# Patient Record
Sex: Female | Born: 1960 | Race: White | Hispanic: No | Marital: Married | Smoking: Never smoker
Health system: Southern US, Community
[De-identification: ages and names within clinical notes are randomized; demographics above are authoritative.]

## PROBLEM LIST (undated history)

## (undated) DIAGNOSIS — J309 Allergic rhinitis, unspecified: Secondary | ICD-10-CM

## (undated) DIAGNOSIS — F419 Anxiety disorder, unspecified: Secondary | ICD-10-CM

## (undated) DIAGNOSIS — G4730 Sleep apnea, unspecified: Secondary | ICD-10-CM

## (undated) DIAGNOSIS — C50919 Malignant neoplasm of unspecified site of unspecified female breast: Secondary | ICD-10-CM

## (undated) DIAGNOSIS — M419 Scoliosis, unspecified: Secondary | ICD-10-CM

## (undated) DIAGNOSIS — F329 Major depressive disorder, single episode, unspecified: Secondary | ICD-10-CM

## (undated) DIAGNOSIS — M1990 Unspecified osteoarthritis, unspecified site: Secondary | ICD-10-CM

## (undated) DIAGNOSIS — R87619 Unspecified abnormal cytological findings in specimens from cervix uteri: Secondary | ICD-10-CM

## (undated) DIAGNOSIS — F32A Depression, unspecified: Secondary | ICD-10-CM

## (undated) DIAGNOSIS — Z923 Personal history of irradiation: Secondary | ICD-10-CM

## (undated) HISTORY — DX: Unspecified abnormal cytological findings in specimens from cervix uteri: R87.619

## (undated) HISTORY — PX: JOINT REPLACEMENT: SHX530

## (undated) HISTORY — DX: Unspecified osteoarthritis, unspecified site: M19.90

## (undated) HISTORY — DX: Allergic rhinitis, unspecified: J30.9

## (undated) HISTORY — PX: BREAST SURGERY: SHX581

## (undated) HISTORY — DX: Anxiety disorder, unspecified: F41.9

## (undated) HISTORY — PX: CERVICAL CONIZATION W/BX: SHX1330

## (undated) HISTORY — DX: Scoliosis, unspecified: M41.9

## (undated) HISTORY — PX: TUBAL LIGATION: SHX77

## (undated) HISTORY — DX: Personal history of irradiation: Z92.3

---

## 2000-06-11 ENCOUNTER — Inpatient Hospital Stay (HOSPITAL_COMMUNITY): Admission: AD | Admit: 2000-06-11 | Discharge: 2000-06-11 | Payer: Self-pay | Admitting: *Deleted

## 2000-07-23 ENCOUNTER — Ambulatory Visit (HOSPITAL_COMMUNITY): Admission: RE | Admit: 2000-07-23 | Discharge: 2000-07-23 | Payer: Self-pay | Admitting: Obstetrics and Gynecology

## 2000-07-23 ENCOUNTER — Encounter: Payer: Self-pay | Admitting: Obstetrics and Gynecology

## 2000-11-18 ENCOUNTER — Encounter (INDEPENDENT_AMBULATORY_CARE_PROVIDER_SITE_OTHER): Payer: Self-pay

## 2000-11-18 ENCOUNTER — Inpatient Hospital Stay (HOSPITAL_COMMUNITY): Admission: AD | Admit: 2000-11-18 | Discharge: 2000-11-20 | Payer: Self-pay | Admitting: Obstetrics and Gynecology

## 2001-01-14 ENCOUNTER — Other Ambulatory Visit: Admission: RE | Admit: 2001-01-14 | Discharge: 2001-01-14 | Payer: Self-pay | Admitting: Obstetrics and Gynecology

## 2003-09-26 ENCOUNTER — Other Ambulatory Visit: Admission: RE | Admit: 2003-09-26 | Discharge: 2003-09-26 | Payer: Self-pay | Admitting: Obstetrics and Gynecology

## 2004-04-05 ENCOUNTER — Ambulatory Visit (HOSPITAL_BASED_OUTPATIENT_CLINIC_OR_DEPARTMENT_OTHER): Admission: RE | Admit: 2004-04-05 | Discharge: 2004-04-05 | Payer: Self-pay | Admitting: Family Medicine

## 2005-11-10 ENCOUNTER — Other Ambulatory Visit: Admission: RE | Admit: 2005-11-10 | Discharge: 2005-11-10 | Payer: Self-pay | Admitting: Obstetrics and Gynecology

## 2006-05-15 ENCOUNTER — Encounter: Admission: RE | Admit: 2006-05-15 | Discharge: 2006-05-15 | Payer: Self-pay | Admitting: Family Medicine

## 2006-12-15 ENCOUNTER — Encounter: Admission: RE | Admit: 2006-12-15 | Discharge: 2006-12-15 | Payer: Self-pay | Admitting: Family Medicine

## 2009-02-12 ENCOUNTER — Encounter: Payer: Self-pay | Admitting: Obstetrics & Gynecology

## 2009-02-12 ENCOUNTER — Ambulatory Visit (HOSPITAL_COMMUNITY): Admission: RE | Admit: 2009-02-12 | Discharge: 2009-02-12 | Payer: Self-pay | Admitting: Obstetrics & Gynecology

## 2009-12-18 ENCOUNTER — Encounter: Admission: RE | Admit: 2009-12-18 | Discharge: 2009-12-18 | Payer: Self-pay | Admitting: Family Medicine

## 2010-08-17 ENCOUNTER — Encounter: Payer: Self-pay | Admitting: Family Medicine

## 2010-11-03 LAB — PREGNANCY, URINE: Preg Test, Ur: NEGATIVE

## 2010-11-03 LAB — CBC
HCT: 36.9 % (ref 36.0–46.0)
Hemoglobin: 13.1 g/dL (ref 12.0–15.0)
MCHC: 35.4 g/dL (ref 30.0–36.0)
MCV: 93.4 fL (ref 78.0–100.0)
Platelets: 218 10*3/uL (ref 150–400)
RBC: 3.95 MIL/uL (ref 3.87–5.11)
RDW: 12.3 % (ref 11.5–15.5)
WBC: 6.1 10*3/uL (ref 4.0–10.5)

## 2010-11-03 LAB — URINALYSIS, ROUTINE W REFLEX MICROSCOPIC
Bilirubin Urine: NEGATIVE
Glucose, UA: NEGATIVE mg/dL
Ketones, ur: NEGATIVE mg/dL
Leukocytes, UA: NEGATIVE
Nitrite: NEGATIVE
Protein, ur: NEGATIVE mg/dL
Specific Gravity, Urine: >1.03 — ABNORMAL HIGH (ref 1.005–1.030)
Urobilinogen, UA: 0.2 mg/dL (ref 0.0–1.0)
pH: 5.5 (ref 5.0–8.0)

## 2010-11-03 LAB — URINE MICROSCOPIC-ADD ON

## 2010-12-10 NOTE — Op Note (Signed)
NAME:  Kelly [name], Kelly [name]             ACCOUNT NO.:  [id number]   MEDICAL RECORD NO.:  [id number]          PATIENT TYPE:  AMB   LOCATION:  SDC                           FACILITY:  [facility]   PHYSICIAN:  M. Leda Quail, MD  DATE OF BIRTH:  August 07, 1960   DATE OF PROCEDURE:  02/12/2009  DATE OF DISCHARGE:                               OPERATIVE REPORT   PREOPERATIVE DIAGNOSES:  20. A 50 year old G6, P3 married white female with daily spotting x1      year.  2. Sonohysterogram showing endometrial polyp.  3. History of abnormal Pap in 1998 treated with conization of the      cervix.  4. History of cesarean section x3 with bilateral tubal ligation on her      third delivery.   POSTOPERATIVE DIAGNOSES:  37. A 50 year old G6, P3 married white female with daily spotting x1      year.  2. Sonohysterogram showing endometrial polyp.  3. History of abnormal Pap in 1998 treated with conization of the      cervix.  4. History of cesarean section x3 with bilateral tubal ligation on her      third delivery.   PROCEDURE:  Hysteroscopy with dilation and curettage.   SURGEON:  Lum Keas, MD   ASSISTANT:  OR staff.   ANESTHESIA:  LMA, Dr. Tacy Dura oversaw the case.   FINDINGS:  Endometrium with an fluffy tissue in the endocervical canal  as well.  No distinct polyps were noted.   SPECIMENS:  Curettings sent to Pathology.   ESTIMATED BLOOD LOSS:  Minimal.   FLUIDS:  1400 mL of LR.   URINE OUTPUT:  Minimal.   FLUID DEFICIT:  125 mL of 1.5% glycine.   COMPLICATIONS:  None.   INDICATIONS:  Kelly [name] is a 50 year old G6, P3 married white  female as a new patient to our practice.  She was seen initially by  nurse practitioner Sara Chu on June 30.  She reported irregular  bleeding over the last year, which upon further discussion was actually  daily spotting which can be dark or bright red or light pink.  Because  of this, a sonohysterogram was recommended with possible biopsy.   This  was done on July 14.  This showed an 8.9 x 4.5 x 5.5 cm uterus with an  8.7-mm endometrium.  Her ovaries appeared normal except for a 14 x 11 x  16-mm cyst with slightly irregular margins on ultrasound.  On  sonohysterogram, she had a 5.4-mm polyp that was like it was present in  the lower uterine segment as well as some irregular-appearing  endometrial tissue inside the cavity.  She has a question of adhesions  of the uterus anteriorly with some thickening on the anterior aspect of  the uterine wall.  We discussed the findings and I had recommended a  biopsy to rule out any abnormal pathology or hysteroscopy with D and C  for possible diagnosis and treatment.  Risks and benefits were explained  to the patient.  She was with her mother in the office.  She decided  that she  wanted to proceed with surgery and she presents for this today.   PROCEDURES:  The patient was taken to the operating room.  She was  placed in a supine position.  Anesthesia was administered by the  anesthesia staff without difficulty.  Legs were positioned in the low  lithotomy position in [place] stirrups.  SCDs in her lower extremities  bilaterally.  Legs were positioned to the high lithotomy position.  Abdomen, perineum and the thighs and vagina were prepped and draped in a  normal sterile fashion.  Time-out was performed.   Bivalve speculum was placed in the vagina.  The cervix was visualized.  It is much smaller than the tip of the cervix because of a conization  and there is scarring around the os.  The cervix was grasped with a  single-tooth tenaculum.  A paracervical block with 1% Xylocaine mixed  1:1 with epinephrine (1:100,000 units) was instilled in the cervix, 10  mL total was used.  Using [facility] dilators, the cervix was dilated up to a  #19.  The uterus sounded to approximately 7.5 cm.  An attempt was made  to pass the 5-mm diagnostic hysteroscope, but the cervix needed to be  dilated up to a #23.   At this point, the hysteroscope could be passed.  There was fluffy tissue of the endometrium that was noted.  The  endometrial cavity had significant fluffy tissue and blood.  The patient  just started her menstrual cycle.  The tubal ostia at this point could  not be identified, but I felt comfortable that I was in the endometrial  cavity.  The scope was removed and a #1 rough curette was used to  curette the cavity to rough gritty texture was noted in all quadrants.  The scope was replaced in the cervical canal and in the endometrial  cavity and much better visualization was present after the fluffy tissue  was removed.  The tubal ostia were noted and the cavity at this point  appeared much more normal and the tissue present was much thinner.  Withdrawing the hysteroscope through the cervical canal, the same was  true of the cervical canal as the fluffy tissue that was present was  also absent.  At this point, the procedure was ended.  Of note, the  uterus was very anteflexed and with any prepping or manipulation of the  uterus, the anterior abdominal wall down low approximately at the level  of her C-section moved.  I do feel that she has some adhesions that are  present as a result of the C-section.  Also, the patient voided right  before going to the operating room and an I and O catheterization was  performed after the patient was prepped, but very scant urine was noted.  At this point, the procedure was ended.  Betadine prep was removed from  the skin.  The procedure was ended.  All instruments were removed from  the vagina.  The tenaculum was removed from the anterior lip of the  cervix.  No bleeding was noted.  The Betadine prep was removed from the  skin.  The legs were positioned back in the supine position.  The  patient was awake from anesthesia without difficulty.   Sponge, lap, needle, and instrument counts were correct x2.  The patient  tolerated the procedure well.  She  was awaken from anesthesia and taken  to the recovery room in stable condition.      Lum Keas,  MD  Electronically Signed     MSM/MEDQ  D:  02/12/2009  T:  02/12/2009  Job:  [id number]

## 2010-12-13 NOTE — H&P (Signed)
[facility]  Patient:    Kelly [name], Kelly [name]                    MRN: [id number] Adm. Date:  [id number] Attending:  Lenoard Aden                         History and Physical  CHIEF COMPLAINT:              Spontaneous rupture of membranes at 0130.  HISTORY OF PRESENT ILLNESS:   [age]-year-old white female, G7, P1-1-4-2, EDD of Dec 08, 2000, at 37 weeks, who presents with spontaneous rupture of membranes.  ALLERGIES:                    No known drug allergies.  MEDICATIONS:                  Prozac and prenatal vitamins.  OBSTETRICAL HISTORY:          Stillborn.  Vaginal delivery 1985.  Spontaneous pregnancy loss 1988.  C-section due to placenta previa and abruption in 1989, at 32 weeks.  Missed AB without D&C and missed AB with D&C, both in the 1990s. Scheduled C-section of 8-pound 11-ounce female 1995, no complications.  Previous history of cervical conization 1998.  FAMILY HISTORY:               Heart disease, hypertension, thyroid dysfunction, non-Hodgkins lymphoma.  PAST MEDICAL HISTORY:         Depression.  PAST SURGICAL HISTORY:        As previously noted.  SOCIAL HISTORY:               Noncontributory.  PRENATAL LABORATORY DATA:     Blood type A positive.  History of chlamydia positive, repeat negative.  Rubella immune.  Hepatitis B surface antigen negative.  HIV negative.  Pregnancy course otherwise uncomplicated.  PHYSICAL EXAMINATION:  GENERAL:                      Well-developed, well-nourished white female in no apparent distress.  HEENT:                        Normal.  LUNGS:                        Clear.  HEART:                        Regular rhythm.  ABDOMEN:                      Soft, gravid, and nontender.  Estimated fetal weight 8 pounds.  PELVIC:                       Cervix is 3, 100%, vertex, and -2.  EXTREMITIES:                  No cords.  NEUROLOGIC:                   Nonfocal.  IMPRESSION:                    Term intrauterine pregnancy with previous C-section x 2, with spontaneous rupture of membranes, in active labor.  PLAN:  Proceed with elective repeat C-section.  The risks of anesthesia, infection, bleeding, injury to abdominal organs with need for repair was discussed.  Tubal ligation discussed.  Failure risk of 5-04/999 and the permanence of the procedure discussed with the patient, and she acknowledges and desires to proceed.DD:  11/18/00 TD:  11/18/00 Job: [id number]

## 2010-12-13 NOTE — Procedures (Signed)
NAME:  Kelly [name], Kelly [name]           ACCOUNT NO.:  [id number]   MEDICAL RECORD NO.:  [id number]          PATIENT TYPE:  OUT   LOCATION:  SLEEP CENTER                 FACILITY:  [facility]   PHYSICIAN:  Clinton D. Maple Hudson, M.D. DATE OF BIRTH:  18-Mar-1961   DATE OF ADMISSION:  04/05/2004  DATE OF DISCHARGE:  04/05/2004                              NOCTURNAL POLYSOMNOGRAM   REFERRING PHYSICIAN:  Dr. [name].   INDICATION FOR STUDY:  Hypersomnia with sleep apnea.  Epworth Sleepiness  Score 14/24.  Neck size 14 inches.  BMI 21.  Weight 128 pounds.  Medications:  Lexapro.   SLEEP ARCHITECTURE:  Total sleep time 252 minutes with sleep efficiency 57%.  Stage 1 was 5%, stage 2 48%, stages 3 and 4 were 13%, REM was 35% of total  sleep time.  Latency to sleep onset:  94 minutes.  Latency to REM:  110  minutes.  Awake after sleep onset:  173 minutes.  She slept only supine.  REM RDI:  27/hour.   RESPIRATORY DATA:  Mild obstructive sleep apnea, hypopnea syndrome, RDI  14/hour.  This included 32 hypopneas, 26 obstructive apneas, one central  apnea.  CPAP was titrated to 6 CWP, RDI 0 per hour, using a small  Respironics Comfort Gel mask.  The patient appeared to tolerate this well.   OXYGEN DATA:  Mild snoring with very mild oxygen desaturation to a nadir of  87%.  Mean oxygen saturation on CPAP was 95-96% on room air.   CARDIAC DATA:  Normal sinus rhythm.   MOVEMENT/PARASOMNIA:  Eighty-three limb jerks were recorded of which 14 were  associated with arousal or awakening for a periodic limb movement with  arousal index of 3.3/hour.   IMPRESSION/RECOMMENDATION:  Mild obstructive sleep apnea/hypopnea syndrome,  RDI 14/hour.  Successful CPAP titration to 6 CWP, RDI 0 per hour, using a  small Respironics Comfort Gel Mask.  Periodic limb movement with arousal,  3.3 episodes per hour.      CDY/MEDQ  D:  04/14/2004 16:51:39  T:  04/15/2004 15:57:01  Job:  [id number]

## 2010-12-13 NOTE — H&P (Signed)
[facility]  Patient:    Kelly [name], Kelly [name]                      MRN: [id number] Adm. Date:  11/24/00 Attending:  Silverio Lay, M.D.                         History and Physical  DATE OF BIRTH:                1961-01-07  REASON FOR ADMISSION:         Intrauterine pregnancy at 38 weeks status post previous cesarean section x 2 admitted for repeat low transverse cesarean section.  HISTORY OF PRESENT ILLNESS:   This is a 50 year old married white female gravida 7, para 3, aborta 3 with a due date of Dec 08, 2000 by ultrasound being admitted at 38 weeks to undergo repeat low transverse cesarean section. She was last evaluated in the office on November 16, 2000 reporting good fetal activity, denying any symptoms of pregnancy-induced hypertension, denying any bleeding or watery discharge, and reporting frequent and irregular contractions as for the last four weeks.  She also reported mucousy discharge that was increasing over the last few days.  Her vaginal exam on that visit revealed a cervix at 1 cm, 90% effaced, vertex -1.  PRENATAL COURSE:              Blood type A+, toxoplasmosis serology compatible with past immunity, RPR nonreactive, rubella immune, HBsAg negative, HIV negative, gonorrhea negative, chlamydia negative.  A 16-week AFP was within normal limits.  Amniocentesis was declined.  A 20-week ultrasound revealed a normal anatomy survey with a cervical length of 3.5 cm.  A 28-week glucose tolerance test was within normal limits.  A 35-week group B Strep was positive.  At 32 weeks for size greater than dates, the patient underwent an ultrasound with estimated fetal weight 4 pounds 11 ounces, 92nd percentile, but a head circumference at the 99% percentile.  The patient was then referred to fetal maternal medicine at [facility] to undergo evaluation for possible macrocephaly.  Everything came back negative and the intracranial anatomy was  normal.  The patient was reassured.  Because of increased contractions during her pregnancy, the patient was maintained on modified bedrest to avoid preterm cervical change.  Prenatal course was otherwise uneventful.  ALLERGIES:                    No known drug allergies.  PAST MEDICAL HISTORY:         1985 vaginal delivery of stillborn at 67 weeks. No known cause except for placental insufficiency.  1988 spontaneous miscarriage at 4-5 weeks.  No D&C.  November 1989 cesarean section at 32 weeks for placenta previa of a female infant weighing 5 pounds 11 ounces.  Two other miscarriages between 1989 and 1995, one requiring D&C.  No complications.  May 1995 scheduled planned elective cesarean section at 40 weeks of a female infant weighing 8 pounds 11 ounces.  No complications.  The patient also had conization in 1998 for dysplasia, normal Pap smears afterwards.  History of depression and the patient has been on Prozac since January 10 and has had no recurrence of symptoms.  FAMILY HISTORY:               Mother with chronic hypertension, hypothyroidism.  Mother with non-Hodgkins lymphoma.  SOCIAL HISTORY:  Married, nonsmoker, homemaker.  PHYSICAL EXAMINATION:  VITAL SIGNS:                  Current weight is 167 pounds for a total weight gain during this pregnancy of 27 pounds.  Blood pressure 102/64.  HEENT:                        Negative.  LUNGS:                        Clear.  HEART:                        Normal.  ABDOMEN:                      Gravid and nontender.  Fundal height at 40 cm. Vertex presentation.  Vaginal exam:  1 cm, 90% effaced, vertex -1.  EXTREMITIES:                  Pitting edema, 3+.  Normal reflexes.  ASSESSMENT:                   Intrauterine pregnancy at 38 weeks, status post two previous cesarean sections to undergo repeat elective cesarean section.  PLAN:                         The patient will be admitted on April 30.  She will undergo  third cesarean section.  Procedure, as well as possible complications including bleeding, infection, and injury to bowels, bladder, and ureters have been reviewed with the patient.  Bilateral tubal ligation was offered with discussion on irreversibility and failure rate of 1:250.  The patient will think and confirm at the time of surgery. DD:  11/16/00 TD:  11/16/00 Job: [id number]

## 2010-12-13 NOTE — Op Note (Signed)
[facility]  Patient:    Kelly [name], Kelly [name]                    MRN: [id number] Proc. Date: 11/18/00 Adm. Date:  [id number] Attending:  Lenoard Aden                           Operative Report  PREOPERATIVE DIAGNOSIS:       A 37 week intrauterine pregnancy, previous                               cesarean section times two, for elective repeat,                               desire for elective sterilization, spontaneous                               rupture of membranes in active labor.  POSTOPERATIVE DIAGNOSIS:      A 37-week intrauterine pregnancy, previous                               cesarean section times two, for elective repeat,                               desire for elective sterilization, spontaneous                               rupture of membranes in active labor.  OPERATION:                    Repeat low segment transverse cesarean section                               and bilateral partial salpingectomy.  SURGEON:                      Lenoard Aden, M.D.  ASSISTANT:                    Silverio Lay, M.D.  ANESTHESIA:                   Spinal by Dr. Jean Rosenthal.  ESTIMATED BLOOD LOSS:         800 cc.  COMPLICATIONS:                None.  DRAINS:                       Foley.  COUNTS:                       Correct.  SPECIMEN:                     Tubal segments x 2.  FINDINGS:                     Full-term living female, right occiput transverse position.  Apgars 8 and 9.  Pediatricians in attendance.  Placenta posteriorly, delivered manually intact.  Normal tubes and ovaries noted.  No evidence of extensive pelvic adhesive disease noted.  Adherence of the anterior bladder flap to the lower edge of the fascia is noted.  DESCRIPTION OF PROCEDURE:     After being apprised of the risks of anesthesia, infection, bleeding, injury to the abdominal organs and need for repair, and failure risk of the tubal ligation of approximately 10  per 1000, the patient was brought to the operating room where she was administered a spinal anesthetic and then placed in the dorsolithotomy position without complications.  Prepped and draped in the usual sterile fashion.  Foley catheter placed.  After achieving adequate anesthesia, dilute Marcaine solution was placed in the incision.  A Pfannenstiel skin incision was made in the area of the old incision with the scalpel, and carried down to the fascia which was nicked in the midline and opened transversely with Mayo scissors. Posterior rectus aponeurosis to the fascia is poorly defined and adherent to the bladder flap.  Sharp dissection noted.  The bladder is without evidence of injury and easily dissected off the anterior wall or the posterior wall of the rectus fascia.  At this time, the visceroperitoneum was scored in a smile-like fashion.  The uterus was scored in a smile-like fashion, and curved hysterotomy incision is developed for atraumatic delivery of a full-term living female fetus, delivered from right occiput transverse position, and the pediatricians are in attendance.  Apgars 8 and 9.  Cord blood collected and the placenta delivered manually intact.  Three vessel cord noted.  The uterus exteriorized and curetted using a dry lap pad, inspected, and finding two normal tubes and ovaries, and no extensions of the curved hysterotomy incision.  At this time, the incision closed using a 0 Monocryl in a continuous running fashion.  Good hemostasis is achieved and pressure is applied.  At this time, the left tube was traced out to its fimbriated end, ampulla-isthmic section of the tube is isolated.  An avascular portion of the mesosalpinx was cauterized.  Proximal and distal 0 plain ties were placed and the tubal segment is excised.  Tubal lumens are visualized and cauterized. Good hemostasis is noted.  The exact same procedure that is performed on the left tube is then performed  on the right tube, and tubal segment is excised as well.  Reinspection of the incision finds good hemostasis.  The pericolic gutters are irrigated.  All blood clot subsequently removed and the bladder flap inspected and found to be hemostatic.  At this time, the uterus was placed in the abdominal cavity.  The incision reinspected.  The fascia then closed using a 0 Vicryl in a continuous running fashion.  The skin closed using staples.  Dilute Marcaine solution was replaced into the incision.  The patient tolerated the procedure well and was transferred to the recovery room in good condition. DD:  11/18/00 TD:  11/18/00 Job: [id number]

## 2010-12-13 NOTE — Consult Note (Signed)
[facility]  Patient:    Kelly [name], Kelly [name]                      MRN: [id number] Adm. Date:  [id number] Attending:  Ermalene Searing Dictator:   Marina Gravel, M.D.                          Consultation Report  CHIEF COMPLAINT:              Lower abdominal pain.  HISTORY OF PRESENT ILLNESS:   The patient is a 50 year old white female gravida 6, para 1-2-2-2, who presents at 15 weeks with complaints of lower abdominal pain.  She stated the pain was aggravated after bending over multiple times while involved with child care today.  No contractions, leakage of fluid or vaginal bleeding.  She describes the pain as sharp in bilateral lower quadrants and was not relieved with rest this evening.  She did notice that it was worse with positional changes and walking.  PAST OBSTETRICS HISTORY:      Complicated by preterm birth x 2 and two miscarriages.  One of the preterm deliveries was also complicated by placenta previa.  She is otherwise healthy.  REVIEW OF SYSTEMS:            Otherwise negative.  PHYSICAL EXAMINATION:  VITAL SIGNS:                  Temperature 98.6, blood pressure 104/61, fetal heart rate 160.  GENERAL:                      The patient is alert and oriented and in no acute distress.  At time of my exam the patient states she is feeling much better, was smiling appropriately and appeared to be in no pain whatsoever.  ABDOMEN:                      Showed active bowel sounds, liver and spleen normal, no hernia, not tenderness, no acute signs.  The patient did have mild bilateral lower quadrant tenderness to palpation.  PELVIC EXAM:                  Normal external female genitalia, vagina and cervix normal.  The cervix was closed.  No blood in the vagina.  LABORATORIES:                 A clean-catch urinalysis within normal limits. CBC showed a white count 11.1, hemoglobin 12.8 and platelets 213,000.  ASSESSMENT:                    Probable round ligament pain.  Preterm labor precautions and bleeding precautions given.  The patient has an appointment in the office tomorrow and will follow up at that time. DD:  06/12/00 TD:  06/12/00 Job: [id number]

## 2010-12-17 ENCOUNTER — Other Ambulatory Visit: Payer: Self-pay | Admitting: Family Medicine

## 2011-02-19 ENCOUNTER — Other Ambulatory Visit: Payer: Self-pay | Admitting: Obstetrics & Gynecology

## 2011-02-19 DIAGNOSIS — R102 Pelvic and perineal pain: Secondary | ICD-10-CM

## 2011-02-21 ENCOUNTER — Ambulatory Visit (HOSPITAL_COMMUNITY)
Admission: RE | Admit: 2011-02-21 | Discharge: 2011-02-21 | Disposition: A | Discharge status: Home / Self Care | Payer: Managed Care, Other (non HMO) | Source: Ambulatory Visit | Attending: Obstetrics & Gynecology | Admitting: Obstetrics & Gynecology

## 2011-02-21 DIAGNOSIS — N83209 Unspecified ovarian cyst, unspecified side: Secondary | ICD-10-CM | POA: Insufficient documentation

## 2011-02-21 DIAGNOSIS — R102 Pelvic and perineal pain: Secondary | ICD-10-CM

## 2011-02-21 DIAGNOSIS — N949 Unspecified condition associated with female genital organs and menstrual cycle: Secondary | ICD-10-CM | POA: Insufficient documentation

## 2011-02-27 ENCOUNTER — Emergency Department (HOSPITAL_COMMUNITY): Payer: Managed Care, Other (non HMO)

## 2011-02-27 ENCOUNTER — Inpatient Hospital Stay (HOSPITAL_COMMUNITY)
Admission: EM | Admit: 2011-02-27 | Discharge: 2011-03-01 | DRG: 917 | Disposition: A | Discharge status: Other Acute Inpatient Hospital | Payer: Managed Care, Other (non HMO) | Attending: Internal Medicine | Admitting: Internal Medicine

## 2011-02-27 DIAGNOSIS — G929 Unspecified toxic encephalopathy: Secondary | ICD-10-CM | POA: Diagnosis present

## 2011-02-27 DIAGNOSIS — M1990 Unspecified osteoarthritis, unspecified site: Secondary | ICD-10-CM | POA: Diagnosis present

## 2011-02-27 DIAGNOSIS — F341 Dysthymic disorder: Secondary | ICD-10-CM | POA: Diagnosis present

## 2011-02-27 DIAGNOSIS — G8929 Other chronic pain: Secondary | ICD-10-CM | POA: Diagnosis present

## 2011-02-27 DIAGNOSIS — G92 Toxic encephalopathy: Secondary | ICD-10-CM | POA: Diagnosis present

## 2011-02-27 DIAGNOSIS — T43502A Poisoning by unspecified antipsychotics and neuroleptics, intentional self-harm, initial encounter: Secondary | ICD-10-CM | POA: Diagnosis present

## 2011-02-27 DIAGNOSIS — Z79899 Other long term (current) drug therapy: Secondary | ICD-10-CM

## 2011-02-27 DIAGNOSIS — N83209 Unspecified ovarian cyst, unspecified side: Secondary | ICD-10-CM | POA: Diagnosis present

## 2011-02-27 DIAGNOSIS — Y9239 Other specified sports and athletic area as the place of occurrence of the external cause: Secondary | ICD-10-CM

## 2011-02-27 DIAGNOSIS — T43294A Poisoning by other antidepressants, undetermined, initial encounter: Secondary | ICD-10-CM | POA: Diagnosis present

## 2011-02-27 DIAGNOSIS — G4733 Obstructive sleep apnea (adult) (pediatric): Secondary | ICD-10-CM | POA: Diagnosis present

## 2011-02-27 DIAGNOSIS — T424X4A Poisoning by benzodiazepines, undetermined, initial encounter: Principal | ICD-10-CM | POA: Diagnosis present

## 2011-02-27 DIAGNOSIS — I959 Hypotension, unspecified: Secondary | ICD-10-CM | POA: Diagnosis present

## 2011-02-27 DIAGNOSIS — Y92838 Other recreation area as the place of occurrence of the external cause: Secondary | ICD-10-CM

## 2011-02-27 LAB — COMPREHENSIVE METABOLIC PANEL
ALT: 23 U/L (ref 0–35)
AST: 25 U/L (ref 0–37)
Albumin: 3.9 g/dL (ref 3.5–5.2)
Alkaline Phosphatase: 72 U/L (ref 39–117)
BUN: 12 mg/dL (ref 6–23)
CO2: 25 mEq/L (ref 19–32)
Calcium: 9.4 mg/dL (ref 8.4–10.5)
Chloride: 104 mEq/L (ref 96–112)
Creatinine, Ser: 0.71 mg/dL (ref 0.50–1.10)
GFR calc Af Amer: >60 mL/min (ref >60)
GFR calc non Af Amer: >60 mL/min (ref >60)
Glucose, Bld: 85 mg/dL (ref 70–99)
Potassium: 3.2 mEq/L — ABNORMAL LOW (ref 3.5–5.1)
Sodium: 138 mEq/L (ref 135–145)
Total Bilirubin: 1.2 mg/dL (ref 0.3–1.2)
Total Protein: 7.2 g/dL (ref 6.0–8.3)

## 2011-02-27 LAB — URINALYSIS, ROUTINE W REFLEX MICROSCOPIC
Bilirubin Urine: NEGATIVE
Glucose, UA: NEGATIVE mg/dL
Hgb urine dipstick: NEGATIVE
Ketones, ur: NEGATIVE mg/dL
Leukocytes, UA: NEGATIVE
Nitrite: NEGATIVE
Protein, ur: NEGATIVE mg/dL
Specific Gravity, Urine: 1.03 (ref 1.005–1.030)
Urobilinogen, UA: 0.2 mg/dL (ref 0.0–1.0)
pH: 5.5 (ref 5.0–8.0)

## 2011-02-27 LAB — CBC
HCT: 39 % (ref 36.0–46.0)
Hemoglobin: 13.9 g/dL (ref 12.0–15.0)
MCH: 31 pg (ref 26.0–34.0)
MCHC: 35.6 g/dL (ref 30.0–36.0)
MCV: 87.1 fL (ref 78.0–100.0)
Platelets: 246 10*3/uL (ref 150–400)
RBC: 4.48 MIL/uL (ref 3.87–5.11)
RDW: 12.8 % (ref 11.5–15.5)
WBC: 10.1 10*3/uL (ref 4.0–10.5)

## 2011-02-27 LAB — RAPID URINE DRUG SCREEN, HOSP PERFORMED
Amphetamines: NOT DETECTED
Barbiturates: NOT DETECTED
Benzodiazepines: POSITIVE — AB
Cocaine: NOT DETECTED
Opiates: NOT DETECTED
Tetrahydrocannabinol: NOT DETECTED

## 2011-02-27 LAB — APTT: aPTT: 34 seconds (ref 24–37)

## 2011-02-27 LAB — SALICYLATE LEVEL: Salicylate Lvl: <2 mg/dL — ABNORMAL LOW (ref 2.8–20.0)

## 2011-02-27 LAB — DIFFERENTIAL
Basophils Absolute: 0 10*3/uL (ref 0.0–0.1)
Basophils Relative: 0 % (ref 0–1)
Eosinophils Absolute: 0.1 10*3/uL (ref 0.0–0.7)
Eosinophils Relative: 1 % (ref 0–5)
Lymphocytes Relative: 14 % (ref 12–46)
Lymphs Abs: 1.4 10*3/uL (ref 0.7–4.0)
Monocytes Absolute: 0.7 10*3/uL (ref 0.1–1.0)
Monocytes Relative: 6 % (ref 3–12)
Neutro Abs: 8 10*3/uL — ABNORMAL HIGH (ref 1.7–7.7)
Neutrophils Relative %: 79 % — ABNORMAL HIGH (ref 43–77)

## 2011-02-27 LAB — URINE MICROSCOPIC-ADD ON

## 2011-02-27 LAB — ETHANOL: Alcohol, Ethyl (B): <11 mg/dL (ref 0–11)

## 2011-02-27 LAB — POCT PREGNANCY, URINE: Preg Test, Ur: NEGATIVE

## 2011-02-27 LAB — PROTIME-INR
INR: 0.98 (ref 0.00–1.49)
Prothrombin Time: 13.2 seconds (ref 11.6–15.2)

## 2011-02-27 LAB — ACETAMINOPHEN LEVEL: Acetaminophen (Tylenol), Serum: <15 ug/mL (ref 10–30)

## 2011-02-28 DIAGNOSIS — F39 Unspecified mood [affective] disorder: Secondary | ICD-10-CM

## 2011-02-28 LAB — CBC
HCT: 36.6 % (ref 36.0–46.0)
Hemoglobin: 12.1 g/dL (ref 12.0–15.0)
MCH: 29.7 pg (ref 26.0–34.0)
MCHC: 33.1 g/dL (ref 30.0–36.0)
MCV: 89.9 fL (ref 78.0–100.0)
Platelets: 227 10*3/uL (ref 150–400)
RBC: 4.07 MIL/uL (ref 3.87–5.11)
RDW: 13 % (ref 11.5–15.5)
WBC: 9.7 10*3/uL (ref 4.0–10.5)

## 2011-02-28 LAB — CARDIAC PANEL(CRET KIN+CKTOT+MB+TROPI)
CK, MB: 3.4 ng/mL (ref 0.3–4.0)
CK, MB: 3.9 ng/mL (ref 0.3–4.0)
CK, MB: 5.9 ng/mL — ABNORMAL HIGH (ref 0.3–4.0)
Relative Index: 1.3 (ref 0.0–2.5)
Relative Index: 1.3 (ref 0.0–2.5)
Relative Index: 1.1 (ref 0.0–2.5)
Total CK: 272 U/L — ABNORMAL HIGH (ref 7–177)
Total CK: 302 U/L — ABNORMAL HIGH (ref 7–177)
Total CK: 536 U/L — ABNORMAL HIGH (ref 7–177)
Troponin I: <0.3 ng/mL (ref <0.30)
Troponin I: <0.3 ng/mL (ref <0.30)
Troponin I: <0.3 ng/mL (ref <0.30)

## 2011-02-28 LAB — COMPREHENSIVE METABOLIC PANEL
ALT: 19 U/L (ref 0–35)
AST: 28 U/L (ref 0–37)
Albumin: 3 g/dL — ABNORMAL LOW (ref 3.5–5.2)
Alkaline Phosphatase: 63 U/L (ref 39–117)
BUN: 10 mg/dL (ref 6–23)
CO2: 22 mEq/L (ref 19–32)
Calcium: 8.1 mg/dL — ABNORMAL LOW (ref 8.4–10.5)
Chloride: 113 mEq/L — ABNORMAL HIGH (ref 96–112)
Creatinine, Ser: 0.61 mg/dL (ref 0.50–1.10)
GFR calc Af Amer: >60 mL/min (ref >60)
GFR calc non Af Amer: >60 mL/min (ref >60)
Glucose, Bld: 81 mg/dL (ref 70–99)
Potassium: 3.9 mEq/L (ref 3.5–5.1)
Sodium: 143 mEq/L (ref 135–145)
Total Bilirubin: 1.3 mg/dL — ABNORMAL HIGH (ref 0.3–1.2)
Total Protein: 5.9 g/dL — ABNORMAL LOW (ref 6.0–8.3)

## 2011-02-28 LAB — LIPID PANEL
Cholesterol: 168 mg/dL (ref 0–200)
HDL: 45 mg/dL (ref >39)
LDL Cholesterol: 108 mg/dL — ABNORMAL HIGH (ref 0–99)
Total CHOL/HDL Ratio: 3.7 RATIO
Triglycerides: 75 mg/dL (ref <150)
VLDL: 15 mg/dL (ref 0–40)

## 2011-02-28 LAB — URINE CULTURE
Colony Count: NO GROWTH
Culture  Setup Time: 201208030124
Culture: NO GROWTH

## 2011-02-28 LAB — MAGNESIUM: Magnesium: 1.9 mg/dL (ref 1.5–2.5)

## 2011-02-28 LAB — TSH: TSH: 0.804 u[IU]/mL (ref 0.350–4.500)

## 2011-02-28 LAB — PRO B NATRIURETIC PEPTIDE: Pro B Natriuretic peptide (BNP): 61.3 pg/mL (ref 0–125)

## 2011-02-28 LAB — MRSA PCR SCREENING: MRSA by PCR: NEGATIVE

## 2011-02-28 LAB — PHOSPHORUS: Phosphorus: 3.3 mg/dL (ref 2.3–4.6)

## 2011-03-01 ENCOUNTER — Inpatient Hospital Stay (HOSPITAL_COMMUNITY)
Admission: RE | Admit: 2011-03-01 | Discharge: 2011-03-03 | DRG: 885 | Disposition: A | Discharge status: Home / Self Care | Payer: Managed Care, Other (non HMO) | Source: Ambulatory Visit | Attending: Psychiatry | Admitting: Psychiatry

## 2011-03-01 DIAGNOSIS — T43294A Poisoning by other antidepressants, undetermined, initial encounter: Secondary | ICD-10-CM

## 2011-03-01 DIAGNOSIS — G4730 Sleep apnea, unspecified: Secondary | ICD-10-CM

## 2011-03-01 DIAGNOSIS — N83209 Unspecified ovarian cyst, unspecified side: Secondary | ICD-10-CM

## 2011-03-01 DIAGNOSIS — F339 Major depressive disorder, recurrent, unspecified: Principal | ICD-10-CM

## 2011-03-01 DIAGNOSIS — F411 Generalized anxiety disorder: Secondary | ICD-10-CM

## 2011-03-01 DIAGNOSIS — T424X4A Poisoning by benzodiazepines, undetermined, initial encounter: Secondary | ICD-10-CM

## 2011-03-01 DIAGNOSIS — T43502A Poisoning by unspecified antipsychotics and neuroleptics, intentional self-harm, initial encounter: Secondary | ICD-10-CM

## 2011-03-01 DIAGNOSIS — Z6379 Other stressful life events affecting family and household: Secondary | ICD-10-CM

## 2011-03-01 DIAGNOSIS — Z79899 Other long term (current) drug therapy: Secondary | ICD-10-CM

## 2011-03-01 DIAGNOSIS — Z818 Family history of other mental and behavioral disorders: Secondary | ICD-10-CM

## 2011-03-01 DIAGNOSIS — M1990 Unspecified osteoarthritis, unspecified site: Secondary | ICD-10-CM

## 2011-03-01 DIAGNOSIS — T438X2A Poisoning by other psychotropic drugs, intentional self-harm, initial encounter: Secondary | ICD-10-CM

## 2011-03-01 LAB — CBC
HCT: 33.1 % — ABNORMAL LOW (ref 36.0–46.0)
Hemoglobin: 11.4 g/dL — ABNORMAL LOW (ref 12.0–15.0)
MCH: 30.8 pg (ref 26.0–34.0)
MCHC: 34.4 g/dL (ref 30.0–36.0)
MCV: 89.5 fL (ref 78.0–100.0)
Platelets: 218 10*3/uL (ref 150–400)
RBC: 3.7 MIL/uL — ABNORMAL LOW (ref 3.87–5.11)
RDW: 12.9 % (ref 11.5–15.5)
WBC: 8.2 10*3/uL (ref 4.0–10.5)

## 2011-03-01 LAB — BASIC METABOLIC PANEL
BUN: 8 mg/dL (ref 6–23)
CO2: 26 mEq/L (ref 19–32)
Calcium: 8.5 mg/dL (ref 8.4–10.5)
Chloride: 106 mEq/L (ref 96–112)
Creatinine, Ser: 0.6 mg/dL (ref 0.50–1.10)
GFR calc Af Amer: >60 mL/min (ref >60)
GFR calc non Af Amer: >60 mL/min (ref >60)
Glucose, Bld: 86 mg/dL (ref 70–99)
Potassium: 4.1 mEq/L (ref 3.5–5.1)
Sodium: 137 mEq/L (ref 135–145)

## 2011-03-06 NOTE — Discharge Summary (Signed)
  NAME:  Kelly [name], SPIEWAK NO.:  [id number]  MEDICAL RECORD NO.:  [id number]  LOCATION:  0500                          FACILITY:  [facility]  PHYSICIAN:  Franchot Gallo, MD     DATE OF BIRTH:  12-06-1960  DATE OF ADMISSION:  03/01/2011 DATE OF DISCHARGE:  03/03/2011                              DISCHARGE SUMMARY   REASON FOR ADMISSION:  This is a 51 year old who was admitted after a drug overdose.  She is a transfer from the medical unit after being stabilized.  She had overdosed on Xanax and Lexapro.  FINAL IMPRESSION:  Axis I:  Major depressive disorder, recurrent resolved.  Generalized anxiety disorder, mild. Axis II:  Deferred. Axis III:  History of osteoarthritis, ovarian cyst, multiple orthopedic operations. Axis IV:  Some marital discord. Axis V:  70.  SIGNIFICANT FINDINGS:  The patient was admitted to the adult milieu for safety and stabilization.  She was active in attending groups.  She was feeling very calm with appropriate mood and affect and felt that she was doing very well.  On day of discharge the patient was having trouble initiating sleep as she has a history of sleep apnea.  Her appetite was good.  Depression had resolved, rating it a zero.  She had only denied any suicidal or homicidal thoughts or auditory or visualizations, having mild anxiety rating a two-to-three and having no medication side effects.  The patient was seen in the interdisciplinary team, was very bright and with good insight.  DISCHARGE MEDICATIONS: 1. Cymbalta 30 mg taking two in the morning and one at 2 p.m. 2. Flexeril 5 mg one every 8 hours as needed. 3. Ibuprofen 200 mg taking four every 8 hours as needed. 4. Multivitamin. 5. Natrol 5 HTP 1 tablet daily.  Her follow-up appointment with [facility] with Greig Right, phone number [phone] and Dr. Lafayette Dragon at [phone] on April 02, 2011 at 12 noon.     Landry Corporal,  N.P.   ______________________________ Franchot Gallo, MD   JO/MEDQ  D:  03/05/2011  T:  03/06/2011  Job:  [id number]  Electronically Signed by Limmie PatriciaP. on 03/06/2011 02:57:54 PM Electronically Signed by Franchot Gallo MD on 03/06/2011 03:29:17 PM

## 2011-03-06 NOTE — Consult Note (Signed)
  NAMEMarland [name]  CASIDEE, JANN NO.:  [id number]  MEDICAL RECORD NO.:  [id number]  LOCATION:  1225                         FACILITY:  [facility]  PHYSICIAN:  Eulogio Ditch, MD DATE OF BIRTH:  09/30/1960  DATE OF CONSULTATION:  02/28/2011 DATE OF DISCHARGE:                                CONSULTATION   REASON FOR CONSULTATION:  Drug overdose.  HISTORY OF PRESENT ILLNESS:  50 year old female who reported ongoing conflict with husband.  The patient is married for 28 years, they got counseling in the outpatient setting and as per her, it worked, but her husband went to [place] in the month of April for 7 days and when he came back, he was a changed person because of the stress and other factors. Also, recently when he went to [place] for 4 days, when he came back as per her, his behavior changed.  The patient actually planned for this attempt.  She went to [facility] with all her medications overdosed there at [place] rental area and then collapsed on the floor.  The patient wrote a note to the family that she is going to kill herself.  The patient and the husband is getting counseling with Lina Sar in the outpatient setting.  The patient takes Xanax and Lexapro for her mood.  PAST MEDICAL HISTORY: 1. History for osteoarthritis. 2. Ovarian cyst. 3. Multiple orthopedic surgeries on the left arm by Dr. Kevan Ny.  MENTAL STATUS EXAMINATION:  The patient is calm, cooperative during the interview.  Mood:  Depressed.  Affect:  Mood congruent.  Speech slow, soft.  Thought process:  Logical and goal directed.  Not delusional, not hallucinating.  The patient overdosed and was admitted on the medical floor because of suicide attempt.  The patient is alert, awake, oriented x3.  Memory:  Immediate, recent, remote fair.  Attention and concentration fair.  Abstraction ability fair.  Insight and judgment poor.  DIAGNOSES:  Axis I:  Mood disorder not otherwise specified. Axis II:   Rule out cluster B personality traits. Axis III:  See medical notes. Axis IV:  Ongoing conflict with husband. Axis V:  30.  RECOMMENDATIONS:  Once the patient is medically cleared, the patient can be admitted to [facility] for further stabilization.  The patient agreed with this treatment plan.     Eulogio Ditch, MD     SA/MEDQ  D:  02/28/2011  T:  02/28/2011  Job:  [id number]  Electronically Signed by Eulogio Ditch  on 03/06/2011 08:54:50 AM

## 2011-03-09 NOTE — Assessment & Plan Note (Signed)
NAME:  Kelly [name], Kelly [name] NO.:  [id number]  MEDICAL RECORD NO.:  [id number]  LOCATION:  0500                          FACILITY:  [facility]  PHYSICIAN:  Franchot Gallo, MD     DATE OF BIRTH:  [birth year]  DATE OF ADMISSION:  03/01/2011 DATE OF DISCHARGE:                      PSYCHIATRIC ADMISSION ASSESSMENT   This is a voluntary admission to the services of Dr. Harvie Heck Reading. This is a 50 year old married white female.  She apparently intentionally overdosed back on 02/27/2011.  She was found at [facility] by people who just came upon her.  Apparently she took her husband's Lexapro, her own Xanax.  She was found to be lethargic but cooperative.  She was admitted to the main hospital where she was stabilized.  She was seen in consultation by Dr. Rogers Blocker and she apparently had written a note stating that she was going to kill herself.  She and her husband are in marital counseling with [place] Scrape and she was very stressed because her mother apparently was exposed to the contaminated waters at [facility].  She herself has recently had a health scare with her ovary, it turned out to just be an ovarian cyst. She has recently had tendon surgery in her left thumb to address the arthritis that she has.  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  PAST PSYCHIATRIC HISTORY:  She has taken antidepressants for 17 years. She has never been under psychiatric care.  It has always been with her primary care.  SOCIAL HISTORY:  She has 1-1/2 years of college.  She has been married once for 28 years.  She has 3 sons, 22, 17, and 10.  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  FAMILY HISTORY:  Her father is bipolar.  One of her brothers is bipolar. He also abused substances and alcohol.  He is currently in a group home. Her mother apparently has had non-Hodgkin's lymphoma, breast cancer, esophageal cancer, and was exposed to the waters at [facility].  ALCOHOL AND DRUG HISTORY:  She denies  any substance abuse.  PRIMARY CARE PROVIDER:  Dr. [name].  ORTHOPEDIC:  The hand surgeon, Dr. Amanda Pea.  THERAPIST:  Dayton Scrape.  MEDICAL PROBLEMS:  As already stated, she has osteoarthritis.  She was recently found to have a left ovarian cyst and sleep apnea.  This is due to a congenital esophageal narrowing.  CURRENTLY PRESCRIBED MEDICATIONS:  At the time she was transferred over from the main hospital, she was on: 1. Flaxseed oil 1 capsule p.o. q. day. 2. Flexeril 5 mg p.o. q.8 h. 3. Multivitamins 1 tab p.o. q. day. 4. Black cohosh over the counter 1 tab p.o. q. day. 5. Superfruit multivitamin 1 gummy p.o. q. day. 6. Magnesium 25 mg 1 tablet p.o. q. day. 7. Biotin 1000 mcg 1 tablet p.o. q. day. 8. Saline nasal spray 2 sprays nasally at h.s. 9. 5HT 100 mg 1 tablet p.o. q. day. 10.Ibuprofen 200 mg take 4 tablets p.o. q.8 h p.r.n. 11.She has been on Cymbalta 60 mg p.o. q. day. 12.Xanax, dosage is unclear.  DRUG ALLERGIES:  Codeine.  POSITIVE PHYSICAL FINDINGS:  Well-developed, well-nourished white female who appears younger than her stated age of  50.  She is in very good physical shape despite her arthritis complaints.  She states that she practices weight lifting to deal with her arthritis and to keep in shape and to prevent bone loss.  Her physical exam is well-documented and on the chart.  MENTAL STATUS EXAM:  Today she is alert and oriented.  She is appropriately groomed, dressed, and nourished complete with full eye makeup.  Her speech is soft.  She does tend to have somewhat tangential conversation and lengthy conversation.  Although pleasant, she does dominate the conversation and does not answer questions.  Her mood was calm.  Her affect had a somewhat depressed range.  Thought processes are not clear, rational, or goal oriented.  Judgment and insight are poor. Concentration and memory are intact.  Intelligence is at least average.  DIAGNOSIS:  AXIS I: 1.  Probably mood disorder, not otherwise specified.  She actually may     be a bipolar II. 2. 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 AXIS II:  Cluster B. AXIS III: 1. Osteoarthritis. 2. Ovarian cyst. 3. Arthritis, both thumbs, with recent surgery on the left. 4. Sleep apnea. 5. She is in menopause. AXIS IV:  Conflict with husband. AXIS V:  30.  PLAN:  Admit for safety and stabilization.  Her meds will be adjusted. She is seeing Dr. Dan Humphreys.  ESTIMATED LENGTH OF STAY:  Three to 5 days.     Mickie Leonarda Salon, P.A.-C.   ______________________________ Franchot Gallo, MD   MD/MEDQ  D:  03/02/2011  T:  03/02/2011  Job:  [id number]  Electronically Signed by Jaci Lazier ADAMS P.A.-C. on 03/08/2011 11:39:08 AM Electronically Signed by Franchot Gallo MD on 03/09/2011 09:50:29 PM

## 2011-03-11 NOTE — Discharge Summary (Signed)
NAMEMarland Kitchen  TRACE, CEDERBERG NO.:  [id number]  MEDICAL RECORD NO.:  [id number]  LOCATION:  1225                         FACILITY:  [facility]  PHYSICIAN:  Erick Blinks, MD     DATE OF BIRTH:  [birth year]  DATE OF ADMISSION:  02/27/2011 DATE OF DISCHARGE:                        DISCHARGE SUMMARY - REFERRING   DISCHARGE DIAGNOSES: 1. Intentional overdose with Xanax and Lexapro. 2. Suicide attempt. 3. Toxic encephalopathy, resolved. 4. History of depression. 5. Osteoarthritis. 6. Ovarian cyst. 7. Obstructive sleep apnea, on CPAP. 8. Physiologic low blood pressure.  The patient's blood pressure     ranges in the 90s.  She is asymptomatic.  DISCHARGE MEDICATIONS: 1. Flaxseed oil 1 capsule p.o. daily. 2. Flexeril 5 mg p.o. q.8 h. p.r.n. 3. Multivitamins 1 tablet p.o. daily. 4. Black Cohosh over-the-counter 1 tablet p.o. daily. 5. Superfruit  multivitamin 1 gummy p.o. daily. 6. Magnesium 25 mg 1 tablet p.o. daily. 7. Biotin 1000 mcg 1 tablet p.o. daily. 8. Saline nasal spray 2 sprays nasally q.h.s. 9. 5-hydroxytryptophan 100 mg 1 tablet p.o. daily. 10.Ibuprofen 200 mg 4 tablets p.o. q.8 h p.r.n.  Medication stopped in the hospital, 1. Cymbalta. 2. Xanax.  ADMISSION HISTORY:  This is a 50 year old female who was found to be lethargic.  [place] namely X[place]ax and Lexapro.  She apparently had taken a total of 30 pills of Xanax and Lexapro combined.  She was found to be somewhat obtunded, but was maintaining her airway.  On arrival to the emergency room, she was subsequently admitted to the step-down unit for close observation.  For details please refer the history and physical per Dr. Susie Cassette on August 2nd.  HOSPITAL COURSE: 1. Overdose.  The patient was monitored closely in the step-down unit.     Her mental status did improve.  She is currently back to her     baseline.  She was seen in consultation  by the Psychiatry Service     and felt that she would need inpatient transfer to [facility].  For further stabilization medically, she is cleared     for transfer at this time and will arrange for that later today.     We have obviously held her Xanax at this time.  We will also     stopped her Cymbalta and these can be readdressed by the inpatient     psychiatry service. 2. Low blood pressure.  The patient's blood pressure runs in the low     90s and she reports that this is normal for her.  She is making     acute amount of urine and does not appear to be volume depleted.     She is asymptomatic and not orthostatic, now this is likely her     normal. 3. Obstructive sleep apnea.  The patient was continued on CPAP here in     the hospital.  CONSULTATIONS: 1. Psychiatry, Dr. Eulogio Ditch, M.D.  DIAGNOSTIC IMAGING:  Chest x-ray on admission shows no active disease.  DISCHARGE INSTRUCTIONS:  The patient can continue on a regular diet, conduct  her activities tolerated.  She will see her primary care physician once she is discharged from [facility], otherwise medically she is cleared for transfer to [facility] once a bed is available.     Erick Blinks, MD     JM/MEDQ  D:  03/01/2011  T:  03/01/2011  Job:  [id number]  Electronically Signed by Durward Mallard MEMON  on 03/11/2011 04:48:57 PM

## 2011-03-26 NOTE — H&P (Signed)
  NAMEMarland [name]  MAIRANY, BRUNO NO.:  [id number]  MEDICAL RECORD NO.:  [id number]  LOCATION:  1225                         FACILITY:  [facility]  PHYSICIAN:  Richarda Overlie, MD       DATE OF BIRTH:  [birth year]  DATE OF ADMISSION:  02/27/2011 DATE OF DISCHARGE:                             HISTORY & PHYSICAL   PRIMARY CARE PHYSICIAN:  Unassigned.  CHIEF COMPLAINT:  Right-sided numbness and tingling.  SUBJECTIVE:  This is a 50 year old Caucasian female with a history of hypertension who presents with right-sided numbness and tingling that started around 2 to 2:30 p.m. this afternoon.  The patient has not felt well since then.  She has complained of a headache.  She does not have any prior history of TIA or stroke.  She does have history of dyslipidemia, gout, and chronic kidney stones.  She denies any symptoms of chest pain, palpitation, shortness of breath.  She denies any  DICTATION ENDED AT THIS POINT     Richarda Overlie, MD     NA/MEDQ  D:  02/27/2011  T:  03/01/2011  Job:  [id number]  Electronically Signed by Richarda Overlie MD on 03/26/2011 07:04:30 PM

## 2011-03-26 NOTE — H&P (Signed)
NAME:  Kelly [name], Kelly [name] NO.:  [id number]  MEDICAL RECORD NO.:  [id number]  LOCATION:  WLED                         FACILITY:  [facility]  PHYSICIAN:  Richarda Overlie, MD       DATE OF BIRTH:  1960/10/24  DATE OF ADMISSION:  02/27/2011 DATE OF DISCHARGE:                             HISTORY & PHYSICAL   PRIMARY CARE PHYSICIAN:  Unassigned.  CHIEF COMPLAINT:  Drug overdose.  SUBJECTIVE:  This is a 50 year old female who presents to the ER with a chief complaint of drug overdose.  The patient was found at [facility] with a bottle of pills namely, Xanax and Lexapro.  The patient apparently took a total of 30 pills of Xanax and Lexapro combined.  She was found to be lethargic, but cooperative with the EMS.  Then, she was found somewhat obtunded, but she had maintained her airway with a GCS of 15 and was brought to the ER for further evaluation of the patient.  The patient has been somewhat depressed and suicidal because of multiple orthopedic surgeries and chronic pain issues.  She has also been concerned about an ovarian cyst.  PAST MEDICAL HISTORY: 1. History of depression. 2. Osteoarthritis. 3. Ovarian cyst.  PAST SURGICAL HISTORY:  History of multiple orthopedic surgeries on the left thumb by Dr. Kevan Ny.  SOCIAL HISTORY:  The patient is married, lives with her husband and 3 children.  She is a Financial trader.  FAMILY HISTORY:  Mother has non-Hodgkin lymphoma, breast cancer, esophageal cancer.  Father has a history of bipolar disorder.  PHYSICAL EXAMINATION:  VITAL SIGNS:  Blood pressure 104/62, pulse of 80, respirations 18. GENERAL:  Comfortable, in no acute cardiopulmonary distress. HEENT:  Pupils equal and reactive.  Extraocular movements intact. NECK:  Supple.  No JVD. LUNGS:  Clear to auscultation bilaterally.  No wheezes or crackles or rhonchi. CARDIOVASCULAR:  Regular rate and rhythm.  No murmurs, rubs, or gallops. ABDOMEN:  Soft, nontender,  nondistended. EXTREMITIES:  Without cyanosis, clubbing, or edema. NEUROLOGIC:  Cranial nerves II through XII grossly intact. PSYCHIATRIC:  Appropriate mood and affect. LYMPHATIC:  No axillary, inguinal, or cervical lymphadenopathy.  LABORATORY DATA:  Urinalysis negative.  Urine drug screen positive for benzos.  INR 0.98.  Coags were less than 2. Alcohol less than 11.  CMP, sodium 138, potassium 3.2, bicarbonate 25, glucose 85, BUN 12, creatinine 0.71.  Total bilirubin 1.2, AST 25, ALT 23, total protein 7.2, calcium 9.4.  Tylenol level less than 15.  Urine pregnancy test negative.  CBC, WBC 10.1, hemoglobin 13.9, hematocrit 39.0, platelet count of 246.  ASSESSMENT AND PLAN: 1. Intentional drug overdose with Lexapro and Xanax. 2. Depression, anxiety.  PLAN:  The patient will be admitted to step-down.  We will monitor her for QTc prolongation.  We will repeat serial will EKGs.  We will monitor her for respiratory depression.  Once she is medically stable, she needs to go to inpatient psychiatric for intentional drug overdose.  She is a full code.     Richarda Overlie, MD     NA/MEDQ  D:  02/27/2011  T:  02/27/2011  Job:  [id number]  Electronically Signed by Richarda Overlie MD on 03/26/2011  07:04:21 PM

## 2012-05-05 ENCOUNTER — Other Ambulatory Visit: Payer: Self-pay | Admitting: Radiology

## 2012-05-05 DIAGNOSIS — C50911 Malignant neoplasm of unspecified site of right female breast: Secondary | ICD-10-CM | POA: Insufficient documentation

## 2012-05-05 DIAGNOSIS — C50919 Malignant neoplasm of unspecified site of unspecified female breast: Secondary | ICD-10-CM

## 2012-05-05 HISTORY — DX: Malignant neoplasm of unspecified site of unspecified female breast: C50.919

## 2012-05-07 ENCOUNTER — Other Ambulatory Visit: Payer: Self-pay | Admitting: Radiology

## 2012-05-07 DIAGNOSIS — C50911 Malignant neoplasm of unspecified site of right female breast: Secondary | ICD-10-CM

## 2012-05-10 ENCOUNTER — Ambulatory Visit
Admission: RE | Admit: 2012-05-10 | Discharge: 2012-05-10 | Disposition: A | Discharge status: Home / Self Care | Payer: Managed Care, Other (non HMO) | Source: Ambulatory Visit | Attending: Radiology | Admitting: Radiology

## 2012-05-10 DIAGNOSIS — C50911 Malignant neoplasm of unspecified site of right female breast: Secondary | ICD-10-CM

## 2012-05-10 MED ORDER — GADOBENATE DIMEGLUMINE 529 MG/ML IV SOLN
10.0000 mL | Freq: Once | INTRAVENOUS | Status: AC | PRN
  Administered 2012-05-10: 10 mL via INTRAVENOUS

## 2012-05-11 ENCOUNTER — Encounter (INDEPENDENT_AMBULATORY_CARE_PROVIDER_SITE_OTHER): Payer: Managed Care, Other (non HMO) | Admitting: Surgery

## 2012-05-12 ENCOUNTER — Ambulatory Visit (INDEPENDENT_AMBULATORY_CARE_PROVIDER_SITE_OTHER): Payer: Managed Care, Other (non HMO) | Admitting: Surgery

## 2012-05-12 ENCOUNTER — Encounter (INDEPENDENT_AMBULATORY_CARE_PROVIDER_SITE_OTHER): Payer: Self-pay | Admitting: Surgery

## 2012-05-12 VITALS — BP 100/62 | HR 76 | Temp 97.9°F | Resp 16 | Ht 64.5 in | Wt 118.0 lb

## 2012-05-12 DIAGNOSIS — C50911 Malignant neoplasm of unspecified site of right female breast: Secondary | ICD-10-CM

## 2012-05-12 DIAGNOSIS — C50919 Malignant neoplasm of unspecified site of unspecified female breast: Secondary | ICD-10-CM

## 2012-05-12 NOTE — Patient Instructions (Signed)
We will schedule a PET scan, an appointment with Dr [name], and an genetic counselor evaluation

## 2012-05-12 NOTE — Progress Notes (Signed)
Patient ID: Kelly [name], female   DOB: 1961/04/29, 51 y.o.   MRN: [id number]  Chief Complaint  Patient presents with  . Breast Cancer    Right    HPI Kelly [name] is a 51 y.o. female.  She recently found a mass in the upper inner quadrant of the right breast. She went for further evaluations and was actually found to have 3 separate masses in the right breast. All were biopsied and all are invasive mammary carcinoma. Receptors were done on one of the 3 and the cancer was 100% ER positive, 0% PR positive, HER-2/neu negative, with a Ki-67 of 12%. An MRI scan confirms the 3 lesions and there is also a suspicious right axillary lymph node. The left side looks okay. Of note is that the patient's mother has had breast cancer as well as some form of non-Hodgkin's lymphoma. The patient's mother is under the care of Dr.Magrinat HPI  Past Medical History  Diagnosis Date  . Anxiety     Past Surgical History  Procedure Date  . Cesarean section     x3  . Joint replacement     Rt and Lt thumbs  . Cervical conization w/bx     Family History  Problem Relation Age of Onset  . Cancer Mother     breast, non-hodgkins lymphoma    Social History History  Substance Use Topics  . Smoking status: Never Smoker   . Smokeless tobacco: Not on file  . Alcohol Use: Yes     very rare    No Known Allergies  Current Outpatient Prescriptions  Medication Sig Dispense Refill  . buPROPion (WELLBUTRIN XL) 150 MG 24 hr tablet       . Multiple Vitamins-Minerals (MULTIVITAMIN WITH MINERALS) tablet Take 1 tablet by mouth daily.      Marland Kitchen OVER THE COUNTER MEDICATION       . Specialty Vitamins Products (MAGNESIUM, AMINO ACID CHELATE,) 133 MG tablet Take 1 tablet by mouth 2 (two) times daily.      . traZODone (DESYREL) 50 MG tablet       . VIIBRYD 40 MG TABS         Review of Systems Review of Systems  Constitutional: Negative for fever, chills and unexpected weight change.  HENT: Negative for  hearing loss, congestion, sore throat, trouble swallowing and voice change.   Eyes: Negative for visual disturbance.  Respiratory: Negative for cough and wheezing.   Cardiovascular: Negative for chest pain, palpitations and leg swelling.  Gastrointestinal: Negative for nausea, vomiting, abdominal pain, diarrhea, constipation, blood in stool, abdominal distention and anal bleeding.  Genitourinary: Negative for hematuria, vaginal bleeding and difficulty urinating.  Musculoskeletal: Negative for arthralgias.  Skin: Negative for rash and wound.  Neurological: Positive for weakness and headaches. Negative for seizures and syncope.  Hematological: Negative for adenopathy. Does not bruise/bleed easily.  Psychiatric/Behavioral: Negative for confusion.    Blood pressure 100/62, pulse 76, temperature 97.9 F (36.6 C), temperature source Temporal, resp. rate 16, height 5' 4.5" (1.638 m), weight 118 lb (53.524 kg), last menstrual period 04/04/2012.  Physical Exam Physical Exam  Vitals reviewed. Constitutional: She is oriented to person, place, and time. She appears well-developed and well-nourished. No distress.  HENT:  Head: Normocephalic and atraumatic.  Mouth/Throat: Oropharynx is clear and moist.  Eyes: Conjunctivae normal and EOM are normal. Pupils are equal, round, and reactive to light. No scleral icterus.  Neck: Normal range of motion. Neck supple. No tracheal deviation present. No  thyromegaly present.  Cardiovascular: Normal rate, regular rhythm, normal heart sounds and intact distal pulses.  Exam reveals no gallop and no friction rub.   No murmur heard. Pulmonary/Chest: Effort normal and breath sounds normal. No respiratory distress. She has no wheezes. She has no rales.         Three hard masses as noted in breast plus abnormal R axillary LN. The patient is thin and the masses are mobile, but very superficial. The LN is not fixed nor are there matted nodes  Abdominal: Soft. Bowel sounds  are normal. She exhibits no distension and no mass. There is no tenderness. There is no rebound and no guarding.  Musculoskeletal: Normal range of motion. She exhibits no edema and no tenderness.  Lymphadenopathy:    She has no cervical adenopathy.    She has axillary adenopathy.       Right axillary: Lateral adenopathy present. No pectoral adenopathy present.       Right: No supraclavicular adenopathy present.  Neurological: She is alert and oriented to person, place, and time.  Skin: Skin is warm and dry. No rash noted. She is not diaphoretic. No erythema.  Psychiatric: She has a normal mood and affect. Her behavior is normal. Judgment and thought content normal.    Data Reviewed I have reviewed the mammogram films ultrasound films and reports with the radiologist; I have reviewed the pathology slides with the pathologist.  Assessment    Multifocal invasive mammary carcinoma right breast, at least clinical stage II    Plan    I had a long talk with the patient and her husband.I have explained the pathophysiology and staging of breast cancer with particular attention to her exact situation. We discussed the multidisciplinary approach to breast cancer which often includes both medical and radiation oncology consultations.  We also discussed surgical options for the treatment of breast cancer including lumpectomy and mastectomy with possible reconstructive surgery. In addition we talked about the evaluation and management of lymph nodes including a description of sentinel lymph node biopsy and axillary dissections. We reviewed potential complications and risks including bleeding, infection, numbness,  lymphedema, and the potential need for additional surgery.  She understands that for patients who are candidate for lumpectomy or mastectomy there is an equal survival rate with either technique, but a slightly higher local recurrence rate with lumpectomy. In addition she knows that a  lumpectomy usually requires postoperative radiation as part of the management of the breast cancer.  We have discussed the likely postoperative course and plans for followup.  I have given the patient some written information that reviewed all of these issues. I believe her questions are answered and that she has a good understanding of the issues.  I have told the patient that I don't believe that she is a candidate for immediate reconstruction under normal circumstances because I believe she'll need postmastectomy radiation. She is not a candidate for a lumpectomy under any circumstances given the multifocality of the disease and the distance between the upper inner quadrant cancer in the lower outer quadrant. I'm concerned about being able to get a negative anterior margin of the did not take the overlying skin but in a transverse incision would not heal to be closed primarily. I'm not certain any vertical incision would be closed either. One option might be to do an immediate reconstruction and take a wide area of skin.  I told patient that I also thought she needed chemotherapy she is currently very adamant  that she does not want to take chemotherapy. She does have a family history with her mother having had breast cancer and she is being diagnosed at age 66. I've recommended genetic counseling evaluation. Given the size of these tumors and positive adenopathy I think she needs staging scans and have arranged for a PET scan to be done. I also making arranges for her to see an oncologist. Once we have further evaluation and decisions we can make further plans.  I spent an hour+ with the patient and her husband and reviewing information. 40 minutes of the time was in counseling.        Saphyre Cillo J 05/12/2012, 5:42 PM

## 2012-05-13 ENCOUNTER — Telehealth (INDEPENDENT_AMBULATORY_CARE_PROVIDER_SITE_OTHER): Payer: Self-pay

## 2012-05-13 NOTE — Telephone Encounter (Signed)
Patient has had a tubal ligation. Kelly [name] was called with information this morning.

## 2012-05-13 NOTE — Telephone Encounter (Signed)
Kelly [name] at [facility] called states she has not heard from our office or [name] pregnancy test prior to PET scan. She will try to reach pt and have her come in to hospital  earlier to do this as a stat lab. She states they will have to cx PET scan without this result. I advised her I will send msg to [facility] re: this.

## 2012-05-14 ENCOUNTER — Ambulatory Visit: Payer: Self-pay

## 2012-05-17 ENCOUNTER — Encounter (INDEPENDENT_AMBULATORY_CARE_PROVIDER_SITE_OTHER): Payer: Self-pay

## 2012-05-17 ENCOUNTER — Telehealth (INDEPENDENT_AMBULATORY_CARE_PROVIDER_SITE_OTHER): Payer: Self-pay

## 2012-05-17 ENCOUNTER — Other Ambulatory Visit: Payer: Self-pay | Admitting: Oncology

## 2012-05-17 NOTE — Telephone Encounter (Signed)
Patient made aware per Dr Jamey Ripa that PET scan looks okay. Made her aware the next step is to see Dr [name]. She will call with any questions.

## 2012-05-17 NOTE — Telephone Encounter (Signed)
Just received the report from [place] and am having this scanned into the system for Dr Jamey Ripa to review and call patient.

## 2012-05-17 NOTE — Telephone Encounter (Signed)
Patient calling for PET Scan results.  Please call [phone]

## 2012-05-18 ENCOUNTER — Other Ambulatory Visit: Payer: Self-pay | Admitting: Oncology

## 2012-05-18 ENCOUNTER — Telehealth: Payer: Self-pay | Admitting: Oncology

## 2012-05-18 DIAGNOSIS — C50911 Malignant neoplasm of unspecified site of right female breast: Secondary | ICD-10-CM

## 2012-05-18 NOTE — Telephone Encounter (Signed)
I called the patient to follow up on a referral from Dr. Jamey Ripa.   Dr. [name] will be out of town and cannot see her as urgently as she would like.  Dr. Welton Flakes will see her Friday.  The patient was very upset about her diagnosis and having to wait.  I spent a long time offering her support and answering her questions.   Patient will come Friday to see Dr. Welton Flakes

## 2012-05-20 ENCOUNTER — Other Ambulatory Visit (HOSPITAL_COMMUNITY): Payer: Managed Care, Other (non HMO)

## 2012-05-21 ENCOUNTER — Ambulatory Visit (HOSPITAL_BASED_OUTPATIENT_CLINIC_OR_DEPARTMENT_OTHER): Payer: Managed Care, Other (non HMO) | Admitting: Oncology

## 2012-05-21 ENCOUNTER — Other Ambulatory Visit (HOSPITAL_BASED_OUTPATIENT_CLINIC_OR_DEPARTMENT_OTHER): Payer: Managed Care, Other (non HMO)

## 2012-05-21 ENCOUNTER — Ambulatory Visit: Payer: Managed Care, Other (non HMO)

## 2012-05-21 ENCOUNTER — Encounter: Payer: Self-pay | Admitting: Oncology

## 2012-05-21 ENCOUNTER — Telehealth: Payer: Self-pay | Admitting: Oncology

## 2012-05-21 VITALS — BP 97/64 | HR 74 | Temp 98.7°F | Resp 20 | Ht 64.5 in | Wt 119.1 lb

## 2012-05-21 DIAGNOSIS — Z170 Estrogen receptor positive status [ER+]: Secondary | ICD-10-CM

## 2012-05-21 DIAGNOSIS — C50419 Malignant neoplasm of upper-outer quadrant of unspecified female breast: Secondary | ICD-10-CM

## 2012-05-21 DIAGNOSIS — C50519 Malignant neoplasm of lower-outer quadrant of unspecified female breast: Secondary | ICD-10-CM

## 2012-05-21 DIAGNOSIS — C50919 Malignant neoplasm of unspecified site of unspecified female breast: Secondary | ICD-10-CM

## 2012-05-21 DIAGNOSIS — C50219 Malignant neoplasm of upper-inner quadrant of unspecified female breast: Secondary | ICD-10-CM

## 2012-05-21 DIAGNOSIS — C50911 Malignant neoplasm of unspecified site of right female breast: Secondary | ICD-10-CM

## 2012-05-21 LAB — CBC WITH DIFFERENTIAL/PLATELET
BASO%: 0.5 % (ref 0.0–2.0)
Basophils Absolute: 0 10*3/uL (ref 0.0–0.1)
EOS%: 1.6 % (ref 0.0–7.0)
Eosinophils Absolute: 0.1 10*3/uL (ref 0.0–0.5)
HCT: 38.3 % (ref 34.8–46.6)
HGB: 13.1 g/dL (ref 11.6–15.9)
LYMPH%: 22.1 % (ref 14.0–49.7)
MCH: 31.3 pg (ref 25.1–34.0)
MCHC: 34.2 g/dL (ref 31.5–36.0)
MCV: 91.4 fL (ref 79.5–101.0)
MONO#: 0.4 10*3/uL (ref 0.1–0.9)
MONO%: 7.8 % (ref 0.0–14.0)
NEUT#: 3.5 10*3/uL (ref 1.5–6.5)
NEUT%: 68 % (ref 38.4–76.8)
Platelets: 251 10*3/uL (ref 145–400)
RBC: 4.19 10*6/uL (ref 3.70–5.45)
RDW: 12.9 % (ref 11.2–14.5)
WBC: 5.1 10*3/uL (ref 3.9–10.3)
lymph#: 1.1 10*3/uL (ref 0.9–3.3)

## 2012-05-21 LAB — COMPREHENSIVE METABOLIC PANEL (CC13)
ALT: 16 U/L (ref 0–55)
AST: 18 U/L (ref 5–34)
Albumin: 4.1 g/dL (ref 3.5–5.0)
Alkaline Phosphatase: 73 U/L (ref 40–150)
BUN: 13 mg/dL (ref 7.0–26.0)
CO2: 28 mEq/L (ref 22–29)
Calcium: 9.8 mg/dL (ref 8.4–10.4)
Chloride: 103 mEq/L (ref 98–107)
Creatinine: 0.9 mg/dL (ref 0.6–1.1)
Glucose: 107 mg/dl — ABNORMAL HIGH (ref 70–99)
Potassium: 3.8 mEq/L (ref 3.5–5.1)
Sodium: 141 mEq/L (ref 136–145)
Total Bilirubin: 1.3 mg/dL — ABNORMAL HIGH (ref 0.20–1.20)
Total Protein: 6.7 g/dL (ref 6.4–8.3)

## 2012-05-21 LAB — CANCER ANTIGEN 27.29: CA 27.29: 43 U/mL — ABNORMAL HIGH (ref 0–39)

## 2012-05-21 NOTE — Progress Notes (Signed)
Checked in new pt with no financial concerns. °

## 2012-05-21 NOTE — Telephone Encounter (Signed)
gve the pt her nov 2013 appt calendar °

## 2012-05-21 NOTE — Patient Instructions (Addendum)
Proceed with seeing Dr. Jamey Ripa and with surgery

## 2012-05-25 ENCOUNTER — Telehealth (INDEPENDENT_AMBULATORY_CARE_PROVIDER_SITE_OTHER): Payer: Self-pay | Admitting: Surgery

## 2012-05-25 ENCOUNTER — Other Ambulatory Visit (INDEPENDENT_AMBULATORY_CARE_PROVIDER_SITE_OTHER): Payer: Self-pay | Admitting: Surgery

## 2012-05-25 ENCOUNTER — Telehealth (INDEPENDENT_AMBULATORY_CARE_PROVIDER_SITE_OTHER): Payer: Self-pay | Admitting: General Surgery

## 2012-05-25 DIAGNOSIS — C50911 Malignant neoplasm of unspecified site of right female breast: Secondary | ICD-10-CM

## 2012-05-25 NOTE — Telephone Encounter (Signed)
I had a long discussion with her today. He PET is negative for distant mets. The suspicious LN in MRI is negative - will need to check to see if it is possible to biopsy before surgery.  She knows she will need a mastectomy and sln, but if she has a + LN will likely need a ax dissection.  Also reviewed with her the possibllity of having close or involved superficial margins since she has three widely separate tumors and, although not fixed to the skin ar close.  I think she understands the situation.

## 2012-05-25 NOTE — Telephone Encounter (Signed)
The number to call is [phone].

## 2012-05-25 NOTE — Telephone Encounter (Signed)
Pt called to ask Dr. Jamey Ripa to do a peer-to-peer review to override the denial of claim for the PET scan.  The insurance company is requesting this to pay.

## 2012-05-25 NOTE — Telephone Encounter (Signed)
Patient will get phone number for peer-to-peer review and get back with me so Dr Jamey Ripa can call.

## 2012-05-26 ENCOUNTER — Other Ambulatory Visit: Payer: Self-pay | Admitting: Radiology

## 2012-05-26 NOTE — Telephone Encounter (Signed)
Patient called back and made her aware of what Dr Jamey Ripa said. She asked multiple questions about why this wasn't done prior to her scan. I told her I did not know because usually they wait for a prior authorization prior to scanning someone or they let them know they don't have it. She was made aware she could appeal the decision. She wanted the number to speak with [place]. It was given to her.

## 2012-05-26 NOTE — Telephone Encounter (Signed)
Left message on machine for patient to call back and ask for me.   

## 2012-05-26 NOTE — Telephone Encounter (Signed)
I called [employer] for peer to peer yesterday. They decline to cover the PET. As I understand the issue they will not cover a PET unless a regular CT Chest/abd/pevis CT is done first and it shows a questionable area.Had she waited for the pre-approval process to complete that would have been known. It is not clear why the radiology people did the scan without the pre-authorization.  At any rate the physician I spoke with told me she can still appeal directly to [facility]. Since she did not have the regular CT first he does not have the authority to make an exception to their rule

## 2012-05-27 ENCOUNTER — Encounter (HOSPITAL_COMMUNITY): Payer: Self-pay | Admitting: Pharmacy Technician

## 2012-05-30 NOTE — Progress Notes (Signed)
Kelly [name] [id number] September 05, 1960 51 y.o. 05/30/2012 6:55 PM  CC  MILLER, Marda Stalker, MD [address] [place] [address] Dr. Olivia Mackie Dr. [name] Dr. Cyndia Bent  REASON FOR CONSULTATION:  51 year old female with new breast cancer in the upper inner quadrant of the right breast among disease is multifocal with 3 separate masses  STAGE:   No matching staging information was found for the patient.  REFERRING PHYSICIAN: Dr. Cyndia Bent  HISTORY OF PRESENT ILLNESS:  Kelly [name] is a 51 y.o. female.  With medical history significant for anxiety. Most recently patient was found to have a mass in the upper inner quadrant of the right breast. She have further evaluation performed including mammograms and ultrasounds and she was found to have 3 separate masses in the right breast. These were biopsied and  The tumors were found to be invasive mammary carcinoma. One area was ER +100% PR negative HER-2/neu negative with Ki-67 12%. Patient was then taken to the MRI that confirmed 3 lesions but also suspicious right axillary lymph node. There was no evidence of disease in the contralateral breast. Patient was seen by Dr. Cyndia Bent who discussed her treatment options including lumpectomy versus mastectomy. He felt that the patient would most likely need a mastectomy. However he also felt that patient would benefit from neoadjuvant treatment. Patient has adamantly refused neoadjuvant chemotherapy. Because her tumor was ER positive suggestion of neoadjuvant antiestrogen therapy was made to the patient. And therefore she was referred to medical oncology for discussion of this. She herself is without any complaints. Today she is accompanied by her husband. Her case was discussed at the multidisciplinary breast conference.   Past Medical History: Past Medical History  Diagnosis Date  . Anxiety     Past Surgical  History: Past Surgical History  Procedure Date  . Cesarean section     x3  . Joint replacement     Rt and Lt thumbs  . Cervical conization w/bx     Family History: Family History  Problem Relation Age of Onset  . Cancer Mother     breast, non-hodgkins lymphoma    Social History History  Substance Use Topics  . Smoking status: Never Smoker   . Smokeless tobacco: Not on file  . Alcohol Use: Yes     Comment: very rare    Allergies: No Known Allergies  Current Medications: Current Outpatient Prescriptions  Medication Sig Dispense Refill  . buPROPion (WELLBUTRIN XL) 150 MG 24 hr tablet Take 150 mg by mouth daily.       . Multiple Vitamins-Minerals (MULTIVITAMIN WITH MINERALS) tablet Take 1 tablet by mouth daily.      . traZODone (DESYREL) 50 MG tablet Take 150 mg by mouth at bedtime.       Marland Kitchen VIIBRYD 40 MG TABS Take 40 mg by mouth daily.          Fertility Discussion:Not applicable Prior History of Cancer:No prior history  Health Maintenance:  Colonoscopy Unknown Bone Density Unknown Last PAP smear Unknown  ECOG PERFORMANCE STATUS: 0 - Asymptomatic  Genetic Counseling/testing: Due to family history I will refer her to genetic counseling  REVIEW OF SYSTEMS:  A comprehensive review of systems was negative.  PHYSICAL EXAMINATION: Blood pressure 97/64, pulse 74, temperature 98.7 F (37.1 C), temperature source Oral, resp. rate 20, height 5' 4.5" (1.638 m), weight 119 lb 1.6 oz (54.023 kg), last menstrual period 04/04/2012.  GNF:AOZHY, healthy,  no distress, well nourished, well developed and anxious SKIN: skin color, texture, turgor are normal HEAD: Normocephalic EYES: PERRLA, EOMI EARS: External ears normal OROPHARYNX:no exudate and no erythema  NECK: supple, no adenopathy LYMPH:  no palpable lymphadenopathy BREAST:There are 3 discrete palpable masses in the right breast one measures 2.1 this centimeters at the 1:00 position. Second is at the 7:00 position  measuring 2.8 cm a third mass is at the 10:00 position measuring 2.2 cm. I can also palpate a lymph node in the right axilla. LUNGS: clear to auscultation  HEART: regular rate & rhythm ABDOMEN:abdomen soft, non-tender, normal bowel sounds and no masses or organomegaly BACK: Back symmetric, no curvature. EXTREMITIES:no edema, no clubbing, no cyanosis  NEURO: alert & oriented x 3 with fluent speech, no focal motor/sensory deficits, gait normal     STUDIES/RESULTS: Mr Breast Bilateral W Wo Contrast  05/11/2012  *RADIOLOGY REPORT*  Clinical Data: The patient has a history of recently diagnosed invasive mammary carcinoma within the right breast with three masses present located at the 1 o'clock, 7 o'clock, and 10 o'clock positions.  She also was found to have increasing mildly suspicious calcifications within the left breast.  Preoperative evaluation.  BILATERAL BREAST MRI WITH AND WITHOUT CONTRAST  Technique: Multiplanar, multisequence MR images of both breasts were obtained prior to and following the intravenous administration of 10ml of Multihance.  Three dimensional images were evaluated at the independent DynaCad workstation.  Comparison:  Prior mammograms from [facility] imaging dated 05/05/2012 and 05/07/2012.  Also comparison with previous ultrasound evaluation dated 05/05/2012.  Findings: There is moderate bilateral parenchymal background enhancement.  There is an  irregular enhancing mass located within the right breast at the 1 o'clock position.  This mass measures 2.1 x 2.0 x 1.9 cm in size.  The clip artifact associated with this mass is located approximately 2 cm lateral and superior to the mass.  There is a second irregular enhancing mass located within the posterior one third of the right breast abutting the pectoralis muscle located at the 7 o'clock position within the right breast measuring 2.8 x 2.7 x 1.8 cm in size. Although the mass abuts the pectoralis muscle there is no evidence for  pectoralis muscle invasion.  The clip associated with this mass is located approximately 1 cm superior and anterior to the mass.  There is a third irregular enhancing mass located within the axillary tail of the right breast at the 10 o'clock position measuring 2.2 x 2.1 x 1.4 cm.  The clip associated with this mass is located approximately 1 cm superior to the mass.  Each of these enhancing masses is associated with a mixture of plateau and washout enhancement kinetics.  There is irregular, enhancing lower right axillary lymph node (level I) present located adjacent to the axillary tail mass.  This lymph node measures 1.3 x 0.7 x 0.7 cm in size and this is a suspicious lymph node.  There are no worrisome enhancing foci within the left breast. There is no evidence for internal mammary adenopathy or left axillary adenopathy.  There are no additional findings.  IMPRESSION: Three irregular enhancing recently biopsied right breast masses as follows:  1.  2.1 cm enhancing mass located at the 1 o'clock position with adjacent clip artifact 2 cm lateral and superior to the mass. 2.  2.8 cm enhancing mass located at the 7 o'clock position with clip artifact located 1 cm anterior to the mass. 3.  2.2 cm irregular enhancing mass 10 o'clock position  with clip artifact located approximately 1 cm superior to the mass. 4.  1.3 cm suspicious lower right axillary lymph node.  RECOMMENDATION: Treatment plan  THREE-DIMENSIONAL MR IMAGE RENDERING ON INDEPENDENT WORKSTATION:  Three-dimensional MR images were rendered by post-processing of the original MR data on an independent workstation.  The three- dimensional MR images were interpreted, and findings were reported in the accompanying complete MRI report for this study.  BI-RADS CATEGORY 6:  Known biopsy-proven malignancy - appropriate action should be taken.   Original Report Authenticated By: Rolla Plate, M.D.      LABS:    Chemistry      Component Value Date/Time   NA  141 05/21/2012 1232   NA 137 03/01/2011 0315   K 3.8 05/21/2012 1232   K 4.1 03/01/2011 0315   CL 103 05/21/2012 1232   CL 106 03/01/2011 0315   CO2 28 05/21/2012 1232   CO2 26 03/01/2011 0315   BUN 13.0 05/21/2012 1232   BUN 8 03/01/2011 0315   CREATININE 0.9 05/21/2012 1232   CREATININE 0.60 03/01/2011 0315      Component Value Date/Time   CALCIUM 9.8 05/21/2012 1232   CALCIUM 8.5 03/01/2011 0315   ALKPHOS 73 05/21/2012 1232   ALKPHOS 63 02/28/2011 0540   AST 18 05/21/2012 1232   AST 28 02/28/2011 0540   ALT 16 05/21/2012 1232   ALT 19 02/28/2011 0540   BILITOT 1.30* 05/21/2012 1232   BILITOT 1.3* 02/28/2011 0540      Lab Results  Component Value Date   WBC 5.1 05/21/2012   HGB 13.1 05/21/2012   HCT 38.3 05/21/2012   MCV 91.4 05/21/2012   PLT 251 05/21/2012   PATHOLOGY: ADDITIONAL INFORMATION: 1. PROGNOSTIC INDICATORS - ACIS Results IMMUNOHISTOCHEMICAL AND MORPHOMETRIC ANALYSIS BY THE AUTOMATED CELLULAR IMAGING SYSTEM (ACIS) Estrogen Receptor (Negative, <1%): 100%, STRONG STAINING INTENSITY Progesterone Receptor (Negative, <1%): 0%, NEGATIVE Proliferation Marker Ki67 by M IB-1 (Low<20%): 12% COMMENT: The negative hormone receptor study(ies) in this case have an internal positive control. All controls stained appropriately Pecola Leisure MD Pathologist, Electronic Signature ( Signed 05/12/2012) 1. CHROMOGENIC IN-SITU HYBRIDIZATION Interpretation HER-2/NEU BY CISH - NO AMPLIFICATION OF HER-2 DETECTED. THE RATIO OF HER-2: CEP 17 SIGNALS WAS 1.47. Reference range: Ratio: HER2:CEP17 < 1.8 - gene amplification not observed Ratio: HER2:CEP 17 1.8-2.2 - equivocal result Ratio: HER2:CEP17 > 2.2 - gene amplification observed 1 of 3 FINAL for Kelly [name], Kelly [name] ([id number]) ADDITIONAL INFORMATION:(continued) Pecola Leisure MD Pathologist, Electronic Signature ( Signed 05/12/2012) FINAL DIAGNOSIS Diagnosis 1. Breast, right, needle core biopsy, 7 o'clock - INVASIVE MAMMARY CARCINOMA. -  LOBULAR NEOPLASIA (ATYPICAL LOBULAR HYPERPLASIA). - SEE COMMENT. 2. Breast, right, needle core biopsy, 10 o'clock - INVASIVE MAMMARY CARCINOMA. - MAMMARY CARCINOMA IN SITU. - SEE COMMENT. 3. Breast, right, needle core biopsy, 1 o'clock - INVASIVE MAMMARY CARCINOMA. - SEE COMMENT. Microscopic Comment 1. The carcinoma is grade I and the phenotype features favor a ductal phenotype. The morphologic features are similar i ASSESSMENT    51 year old female with  #1 new diagnosis of clinical stage II right breast cancer multifocal invasive mammary by biopsies. The tumor is ER +100% PR and negative HER-2/neu negative with Ki-67 of 12%. Patient is seen in medical oncology for discussion of treatment options. Patient and I discussed the pathology in detail. We also discussed the staging of breast cancer specifically her stage since it is a multifocal disease and she has 3 areas of concern. We discussed her treatment options including mastectomy with or  discussion of margins. We also discussed the MRI findings of a potentially positive lymph node. We discussed systemic treatment options including chemotherapy which she does not want. We discussed the approach to systemic therapy such as neoadjuvant to try to shrink the tumor so that she can have a better cosmetic result and hopefully negative margins when she has a mastectomy. We discussed the role of antiestrogen therapy systemically to help prevent breast cancer from recurring in other parts of the body. Patient understands that neoadjuvant antiestrogen therapy is usually given for anywhere between 6 and 9 months before she'll be ready for surgery. However Ms. Welborn stated that she does not want to wait that long to have her surgery done and she wants it done very quickly. She again declined neoadjuvant chemotherapy.    PLAN:    At the end of this visit patient opted to go straight to surgery. I sent a message to Dr. Jamey Ripa regarding her that she is  ready for surgery in spite of [place] trying to get her to neoadjuvant treatment so that she would have adequate margins specifically negative margins even if she does have a mastectomy. Patient did discuss the thought of having a lymph node biopsy performed prior to the surgery. And certainly this can be done. Patient and I did discuss postop treatment which could possibly include post mastectomy radiation therapy. We also discussed adjuvant therapy with chemotherapy versus antiestrogen therapy. We discussed Oncotype DX testing to evaluate her recurrence score to see whether she would benefit from chemotherapy or not. She was open to this as was her husband. I will plan on seeing her back after she has had her surgery.       Discussion: Patient is being treated per NCCN breast cancer care guidelines appropriate for stage.II   Thank you so much for allowing me to participate in the care of Kelly [name]. I will continue to follow up the patient with you and assist in her care.  All questions were answered. The patient knows to call the clinic with any problems, questions or concerns. We can certainly see the patient much sooner if necessary.  I spent 60 minutes counseling the patient face to face. The total time spent in the appointment was 60 minutes.  Drue Second, MD Medical/Oncology [facility] [phone] (beeper) [phone] (Office)

## 2012-05-31 ENCOUNTER — Encounter: Payer: Self-pay | Admitting: *Deleted

## 2012-05-31 NOTE — Progress Notes (Signed)
Mailed after appt letter to pt. 

## 2012-06-01 ENCOUNTER — Telehealth (INDEPENDENT_AMBULATORY_CARE_PROVIDER_SITE_OTHER): Payer: Self-pay | Admitting: Surgery

## 2012-06-01 ENCOUNTER — Telehealth (INDEPENDENT_AMBULATORY_CARE_PROVIDER_SITE_OTHER): Payer: Self-pay | Admitting: General Surgery

## 2012-06-01 NOTE — Telephone Encounter (Signed)
Patient states they were unable to do a core biopsy on her and they performed a needle aspiration on her axillary lymph node. She stated speaking with the MD at [facility] she was told this presents a wide margin of error whether the results would be accurate. She wants to talk with you (Dr Jamey Ripa) about whether she should go ahead with the sentinel node biopsy at the time of surgery or whether she should have an axillary dissection. I made her aware you would probably recommend the sentinel node because she does not have a known node involved and we would not want to do a dissection because of the risk of lymphedema unless necessary. I did make her aware if we went ahead with the sentinel node evaluation there would be the possibility of a second surgery to remove axillary node if she does have involvement. Please call patient to discuss. [phone].

## 2012-06-01 NOTE — Telephone Encounter (Signed)
Message copied by Liliana Cline on Tue Jun 01, 2012  4:27 PM ------      Message from: Cathi Roan      Created: Tue Jun 01, 2012  3:24 PM      Regarding: pathology report      Contact: [phone]       Expecting to hear from Dr. Carlis Abbott about surgery and concerned because she has not heard anything.

## 2012-06-01 NOTE — Telephone Encounter (Signed)
She had an FNA biopsy of a somewhat suspicious right axillary lymph node that turned out to be negative. I talked to her about that report. They were unable to do a needle core biopsy.  I still am in favor of doing a sentinel lymph node. The lymph node is positive at the time of surgery will proceed with a node dissection. If it is not positive then we will not do a full axillary dissection. I reviewed that with the patient and explained to her my reasoning. I think she understands and we will proceed in that direction as is already scheduled.

## 2012-06-02 ENCOUNTER — Encounter (HOSPITAL_COMMUNITY): Payer: Self-pay

## 2012-06-02 ENCOUNTER — Encounter (INDEPENDENT_AMBULATORY_CARE_PROVIDER_SITE_OTHER): Payer: Self-pay

## 2012-06-02 ENCOUNTER — Encounter (HOSPITAL_COMMUNITY)
Admission: RE | Admit: 2012-06-02 | Discharge: 2012-06-02 | Disposition: A | Discharge status: Home / Self Care | Payer: Managed Care, Other (non HMO) | Source: Ambulatory Visit | Attending: Surgery | Admitting: Surgery

## 2012-06-02 HISTORY — DX: Depression, unspecified: F32.A

## 2012-06-02 HISTORY — DX: Major depressive disorder, single episode, unspecified: F32.9

## 2012-06-02 LAB — CBC
HCT: 37.5 % (ref 36.0–46.0)
Hemoglobin: 12.8 g/dL (ref 12.0–15.0)
MCH: 30.9 pg (ref 26.0–34.0)
MCHC: 34.1 g/dL (ref 30.0–36.0)
MCV: 90.6 fL (ref 78.0–100.0)
Platelets: 234 10*3/uL (ref 150–400)
RBC: 4.14 MIL/uL (ref 3.87–5.11)
RDW: 13.1 % (ref 11.5–15.5)
WBC: 6.6 10*3/uL (ref 4.0–10.5)

## 2012-06-02 LAB — NO BLOOD PRODUCTS

## 2012-06-02 LAB — SURGICAL PCR SCREEN
MRSA, PCR: NEGATIVE
Staphylococcus aureus: POSITIVE — AB

## 2012-06-02 LAB — HCG, SERUM, QUALITATIVE: Preg, Serum: NEGATIVE

## 2012-06-02 NOTE — Pre-Procedure Instructions (Signed)
21 Kelly [name]  06/02/2012   Your procedure is scheduled on:  Monday November 11  Report to [facility] at 6:45 AM.  Call this number if you have problems the morning of surgery: [phone]   Remember:   Do not eat or drink:After Midnight.    Take these medicines the morning of surgery with A SIP OF WATER: Wellbutrin, Viibryd   Do not wear jewelry, make-up or nail polish.  Do not wear lotions, powders, or perfumes. You may wear deodorant.  Do not shave 48 hours prior to surgery. Men may shave face and neck.  Do not bring valuables to the hospital.  Contacts, dentures or bridgework may not be worn into surgery.  Leave suitcase in the car. After surgery it may be brought to your room.  For patients admitted to the hospital, checkout time is 11:00 AM the day of discharge.   Patients discharged the day of surgery will not be allowed to drive home.  Name and phone number of your driver: NA  Special Instructions: Shower using CHG 2 nights before surgery and the night before surgery.  If you shower the day of surgery use CHG.  Use special wash - you have one bottle of CHG for all showers.  You should use approximately 1/3 of the bottle for each shower.   Please read over the following fact sheets that you were given: Pain Booklet, Coughing and Deep Breathing and Surgical Site Infection Prevention

## 2012-06-02 NOTE — Progress Notes (Signed)
Spoke with Kelly [name] in Dr. Tenna Child office, requested clarification of consent order; as written states "right mastectomy and sentinel node".

## 2012-06-06 MED ORDER — CHLORHEXIDINE GLUCONATE 4 % EX LIQD: 1.0000 "application " | Freq: Once | CUTANEOUS | Status: DC

## 2012-06-06 MED ORDER — CEFAZOLIN SODIUM-DEXTROSE 2-3 GM-% IV SOLR
2.0000 g | INTRAVENOUS | Status: AC
  Administered 2012-06-07: 2 g via INTRAVENOUS
  Filled 2012-06-06: qty 50

## 2012-06-07 ENCOUNTER — Encounter (HOSPITAL_COMMUNITY): Payer: Self-pay | Admitting: *Deleted

## 2012-06-07 ENCOUNTER — Encounter (HOSPITAL_COMMUNITY)
Admission: RE | Admit: 2012-06-07 | Discharge: 2012-06-07 | Disposition: A | Discharge status: Home / Self Care | Payer: Managed Care, Other (non HMO) | Source: Ambulatory Visit | Attending: Surgery | Admitting: Surgery

## 2012-06-07 ENCOUNTER — Ambulatory Visit (HOSPITAL_COMMUNITY): Payer: Managed Care, Other (non HMO) | Admitting: Anesthesiology

## 2012-06-07 ENCOUNTER — Encounter (HOSPITAL_COMMUNITY)
Admission: RE | Disposition: A | Discharge status: Home / Self Care | Payer: Self-pay | Source: Ambulatory Visit | Attending: Surgery

## 2012-06-07 ENCOUNTER — Encounter (HOSPITAL_COMMUNITY): Payer: Self-pay | Admitting: Anesthesiology

## 2012-06-07 ENCOUNTER — Inpatient Hospital Stay (HOSPITAL_COMMUNITY)
Admission: RE | Admit: 2012-06-07 | Discharge: 2012-06-08 | DRG: 580 | Disposition: A | Discharge status: Home / Self Care | Payer: Managed Care, Other (non HMO) | Source: Ambulatory Visit | Attending: Surgery | Admitting: Surgery

## 2012-06-07 DIAGNOSIS — C50911 Malignant neoplasm of unspecified site of right female breast: Secondary | ICD-10-CM | POA: Diagnosis present

## 2012-06-07 DIAGNOSIS — Z9889 Other specified postprocedural states: Secondary | ICD-10-CM

## 2012-06-07 DIAGNOSIS — F3289 Other specified depressive episodes: Secondary | ICD-10-CM | POA: Diagnosis present

## 2012-06-07 DIAGNOSIS — C773 Secondary and unspecified malignant neoplasm of axilla and upper limb lymph nodes: Secondary | ICD-10-CM | POA: Diagnosis present

## 2012-06-07 DIAGNOSIS — C50919 Malignant neoplasm of unspecified site of unspecified female breast: Secondary | ICD-10-CM | POA: Insufficient documentation

## 2012-06-07 DIAGNOSIS — F329 Major depressive disorder, single episode, unspecified: Secondary | ICD-10-CM | POA: Diagnosis present

## 2012-06-07 DIAGNOSIS — F411 Generalized anxiety disorder: Secondary | ICD-10-CM | POA: Diagnosis present

## 2012-06-07 DIAGNOSIS — Z803 Family history of malignant neoplasm of breast: Secondary | ICD-10-CM

## 2012-06-07 DIAGNOSIS — Z79899 Other long term (current) drug therapy: Secondary | ICD-10-CM

## 2012-06-07 DIAGNOSIS — D0590 Unspecified type of carcinoma in situ of unspecified breast: Secondary | ICD-10-CM

## 2012-06-07 DIAGNOSIS — Z807 Family history of other malignant neoplasms of lymphoid, hematopoietic and related tissues: Secondary | ICD-10-CM

## 2012-06-07 DIAGNOSIS — Z170 Estrogen receptor positive status [ER+]: Secondary | ICD-10-CM

## 2012-06-07 DIAGNOSIS — Z96698 Presence of other orthopedic joint implants: Secondary | ICD-10-CM

## 2012-06-07 HISTORY — PX: MASTECTOMY MODIFIED RADICAL: SHX5962

## 2012-06-07 HISTORY — PX: MASTECTOMY W/ SENTINEL NODE BIOPSY: SHX2001

## 2012-06-07 SURGERY — MASTECTOMY WITH SENTINEL LYMPH NODE BIOPSY
Anesthesia: General | Site: Breast | Laterality: Right | Wound class: Clean

## 2012-06-07 MED ORDER — OXYCODONE HCL 5 MG PO TABS: 5.0000 mg | ORAL_TABLET | Freq: Once | ORAL | Status: DC | PRN

## 2012-06-07 MED ORDER — BUPROPION HCL ER (XL) 150 MG PO TB24
150.0000 mg | ORAL_TABLET | Freq: Every day | ORAL | Status: DC
  Administered 2012-06-08: 150 mg via ORAL
  Filled 2012-06-07 (×2): qty 1

## 2012-06-07 MED ORDER — MIDAZOLAM HCL 2 MG/2ML IJ SOLN
1.0000 mg | INTRAMUSCULAR | Status: DC | PRN
  Administered 2012-06-07: 2 mg via INTRAVENOUS

## 2012-06-07 MED ORDER — ONDANSETRON HCL 4 MG/2ML IJ SOLN: 4.0000 mg | Freq: Four times a day (QID) | INTRAMUSCULAR | Status: DC | PRN

## 2012-06-07 MED ORDER — HYDROMORPHONE HCL PF 1 MG/ML IJ SOLN
0.2500 mg | INTRAMUSCULAR | Status: DC | PRN
  Administered 2012-06-07 (×2): 0.5 mg via INTRAVENOUS

## 2012-06-07 MED ORDER — HYDROMORPHONE HCL PF 1 MG/ML IJ SOLN
INTRAMUSCULAR | Status: AC
  Filled 2012-06-07: qty 1

## 2012-06-07 MED ORDER — MIDAZOLAM HCL 2 MG/2ML IJ SOLN
INTRAMUSCULAR | Status: AC
  Filled 2012-06-07: qty 2

## 2012-06-07 MED ORDER — OXYCODONE HCL 5 MG/5ML PO SOLN: 5.0000 mg | Freq: Once | ORAL | Status: DC | PRN

## 2012-06-07 MED ORDER — LACTATED RINGERS IV SOLN
INTRAVENOUS | Status: DC | PRN
  Administered 2012-06-07 (×2): via INTRAVENOUS

## 2012-06-07 MED ORDER — OXYCODONE-ACETAMINOPHEN 5-325 MG PO TABS
1.0000 | ORAL_TABLET | ORAL | Status: DC | PRN
  Administered 2012-06-07 – 2012-06-08 (×4): 2 via ORAL
  Filled 2012-06-07 (×4): qty 2

## 2012-06-07 MED ORDER — BUPIVACAINE-EPINEPHRINE (PF) 0.5% -1:200000 IJ SOLN
INTRAMUSCULAR | Status: AC
  Filled 2012-06-07: qty 10

## 2012-06-07 MED ORDER — DEXTROSE IN LACTATED RINGERS 5 % IV SOLN
INTRAVENOUS | Status: DC
  Administered 2012-06-07 – 2012-06-08 (×2): via INTRAVENOUS

## 2012-06-07 MED ORDER — EPHEDRINE SULFATE 50 MG/ML IJ SOLN
INTRAMUSCULAR | Status: DC | PRN
  Administered 2012-06-07 (×2): 5 mg via INTRAVENOUS
  Administered 2012-06-07: 10 mg via INTRAVENOUS

## 2012-06-07 MED ORDER — BUPIVACAINE-EPINEPHRINE PF 0.5-1:200000 % IJ SOLN
INTRAMUSCULAR | Status: DC | PRN
  Administered 2012-06-07: 40 mL

## 2012-06-07 MED ORDER — FENTANYL CITRATE 0.05 MG/ML IJ SOLN
INTRAMUSCULAR | Status: AC
  Filled 2012-06-07: qty 2

## 2012-06-07 MED ORDER — TECHNETIUM TC 99M SULFUR COLLOID FILTERED
1.0000 | Freq: Once | INTRAVENOUS | Status: AC | PRN
  Administered 2012-06-07: 1 via INTRADERMAL

## 2012-06-07 MED ORDER — PHENYLEPHRINE HCL 10 MG/ML IJ SOLN
INTRAMUSCULAR | Status: DC | PRN
  Administered 2012-06-07: 80 ug via INTRAVENOUS
  Administered 2012-06-07: 40 ug via INTRAVENOUS
  Administered 2012-06-07 (×2): 80 ug via INTRAVENOUS
  Administered 2012-06-07: 40 ug via INTRAVENOUS
  Administered 2012-06-07: 80 ug via INTRAVENOUS

## 2012-06-07 MED ORDER — HYDROMORPHONE HCL PF 1 MG/ML IJ SOLN
2.0000 mg | INTRAMUSCULAR | Status: DC | PRN
  Administered 2012-06-07 – 2012-06-08 (×2): 2 mg via INTRAVENOUS
  Filled 2012-06-07 (×2): qty 2

## 2012-06-07 MED ORDER — MIDAZOLAM HCL 5 MG/ML IJ SOLN: 2.0000 mg | Freq: Once | INTRAMUSCULAR | Status: DC

## 2012-06-07 MED ORDER — ARTIFICIAL TEARS OP OINT
TOPICAL_OINTMENT | OPHTHALMIC | Status: DC | PRN
  Administered 2012-06-07: 1 via OPHTHALMIC

## 2012-06-07 MED ORDER — METHYLENE BLUE 1 % INJ SOLN
INTRAMUSCULAR | Status: AC
  Filled 2012-06-07: qty 10

## 2012-06-07 MED ORDER — ONDANSETRON HCL 4 MG PO TABS: 4.0000 mg | ORAL_TABLET | Freq: Four times a day (QID) | ORAL | Status: DC | PRN

## 2012-06-07 MED ORDER — FENTANYL CITRATE 0.05 MG/ML IJ SOLN: 50.0000 ug | INTRAMUSCULAR | Status: DC | PRN

## 2012-06-07 MED ORDER — 0.9 % SODIUM CHLORIDE (POUR BTL) OPTIME
TOPICAL | Status: DC | PRN
  Administered 2012-06-07 (×2): 1000 mL

## 2012-06-07 MED ORDER — LIDOCAINE HCL (CARDIAC) 20 MG/ML IV SOLN
INTRAVENOUS | Status: DC | PRN
  Administered 2012-06-07: 50 mg via INTRAVENOUS

## 2012-06-07 MED ORDER — ONDANSETRON HCL 4 MG/2ML IJ SOLN
INTRAMUSCULAR | Status: DC | PRN
  Administered 2012-06-07: 4 mg via INTRAVENOUS

## 2012-06-07 MED ORDER — SODIUM CHLORIDE 0.9 % IJ SOLN
INTRAMUSCULAR | Status: DC | PRN
  Administered 2012-06-07: 60 mL

## 2012-06-07 MED ORDER — SODIUM CHLORIDE 0.9 % IJ SOLN
INTRAMUSCULAR | Status: DC | PRN
  Administered 2012-06-07: 09:00:00 via INTRAMUSCULAR

## 2012-06-07 MED ORDER — PROPOFOL 10 MG/ML IV BOLUS
INTRAVENOUS | Status: DC | PRN
  Administered 2012-06-07: 150 mg via INTRAVENOUS

## 2012-06-07 MED ORDER — LACTATED RINGERS IV SOLN
INTRAVENOUS | Status: DC
  Administered 2012-06-07: 50 mL/h via INTRAVENOUS

## 2012-06-07 MED ORDER — DEXAMETHASONE SODIUM PHOSPHATE 4 MG/ML IJ SOLN
INTRAMUSCULAR | Status: DC | PRN
  Administered 2012-06-07: 4 mg via INTRAVENOUS

## 2012-06-07 MED ORDER — FENTANYL CITRATE 0.05 MG/ML IJ SOLN
INTRAMUSCULAR | Status: DC | PRN
  Administered 2012-06-07: 50 ug via INTRAVENOUS
  Administered 2012-06-07 (×3): 25 ug via INTRAVENOUS
  Administered 2012-06-07: 50 ug via INTRAVENOUS
  Administered 2012-06-07 (×3): 25 ug via INTRAVENOUS

## 2012-06-07 MED ORDER — MIDAZOLAM HCL 5 MG/5ML IJ SOLN
INTRAMUSCULAR | Status: DC | PRN
  Administered 2012-06-07: 1 mg via INTRAVENOUS

## 2012-06-07 MED ORDER — HEPARIN SODIUM (PORCINE) 5000 UNIT/ML IJ SOLN
5000.0000 [IU] | Freq: Three times a day (TID) | INTRAMUSCULAR | Status: DC
  Administered 2012-06-08: 5000 [IU] via SUBCUTANEOUS
  Filled 2012-06-07 (×4): qty 1

## 2012-06-07 MED ORDER — TRAZODONE HCL 150 MG PO TABS
150.0000 mg | ORAL_TABLET | Freq: Every day | ORAL | Status: DC
  Administered 2012-06-07: 150 mg via ORAL
  Filled 2012-06-07 (×2): qty 1

## 2012-06-07 SURGICAL SUPPLY — 56 items
APPLIER CLIP 9.375 MED OPEN (MISCELLANEOUS) ×4
APPLIER CLIP 9.375 SM OPEN (CLIP)
BINDER BREAST LRG (GAUZE/BANDAGES/DRESSINGS) IMPLANT
BINDER BREAST MEDIUM (GAUZE/BANDAGES/DRESSINGS) ×2 IMPLANT
BINDER BREAST XLRG (GAUZE/BANDAGES/DRESSINGS) IMPLANT
BIOPATCH RED 1 DISK 7.0 (GAUZE/BANDAGES/DRESSINGS) ×4 IMPLANT
CANISTER SUCTION 2500CC (MISCELLANEOUS) ×2 IMPLANT
CHLORAPREP W/TINT 26ML (MISCELLANEOUS) ×2 IMPLANT
CLIP APPLIE 9.375 MED OPEN (MISCELLANEOUS) ×2 IMPLANT
CLIP APPLIE 9.375 SM OPEN (CLIP) IMPLANT
CLOTH BEACON ORANGE TIMEOUT ST (SAFETY) ×2 IMPLANT
CONT SPEC 4OZ CLIKSEAL STRL BL (MISCELLANEOUS) ×10 IMPLANT
COVER PROBE W GEL 5X96 (DRAPES) ×2 IMPLANT
COVER SURGICAL LIGHT HANDLE (MISCELLANEOUS) ×2 IMPLANT
DERMABOND ADHESIVE PROPEN (GAUZE/BANDAGES/DRESSINGS) ×1
DERMABOND ADVANCED (GAUZE/BANDAGES/DRESSINGS) ×2
DERMABOND ADVANCED .7 DNX12 (GAUZE/BANDAGES/DRESSINGS) ×2 IMPLANT
DERMABOND ADVANCED .7 DNX6 (GAUZE/BANDAGES/DRESSINGS) ×1 IMPLANT
DRAIN CHANNEL 19F RND (DRAIN) ×4 IMPLANT
DRAPE LAPAROSCOPIC ABDOMINAL (DRAPES) ×2 IMPLANT
DRAPE SURG 17X23 STRL (DRAPES) ×2 IMPLANT
DRAPE UTILITY 15X26 W/TAPE STR (DRAPE) ×4 IMPLANT
DRSG ADAPTIC 3X8 NADH LF (GAUZE/BANDAGES/DRESSINGS) IMPLANT
DRSG PAD ABDOMINAL 8X10 ST (GAUZE/BANDAGES/DRESSINGS) ×4 IMPLANT
ELECT BLADE 4.0 EZ CLEAN MEGAD (MISCELLANEOUS) ×2
ELECT CAUTERY BLADE 6.4 (BLADE) ×2 IMPLANT
ELECT REM PT RETURN 9FT ADLT (ELECTROSURGICAL) ×2
ELECTRODE BLDE 4.0 EZ CLN MEGD (MISCELLANEOUS) ×1 IMPLANT
ELECTRODE REM PT RTRN 9FT ADLT (ELECTROSURGICAL) ×1 IMPLANT
EVACUATOR SILICONE 100CC (DRAIN) ×4 IMPLANT
GLOVE BIOGEL PI IND STRL 7.0 (GLOVE) ×1 IMPLANT
GLOVE BIOGEL PI INDICATOR 7.0 (GLOVE) ×1
GLOVE EUDERMIC 7 POWDERFREE (GLOVE) ×2 IMPLANT
GOWN PREVENTION PLUS XLARGE (GOWN DISPOSABLE) ×2 IMPLANT
GOWN STRL NON-REIN LRG LVL3 (GOWN DISPOSABLE) ×2 IMPLANT
KIT BASIN OR (CUSTOM PROCEDURE TRAY) ×2 IMPLANT
KIT ROOM TURNOVER OR (KITS) ×2 IMPLANT
NEEDLE 18GX1X1/2 (RX/OR ONLY) (NEEDLE) ×2 IMPLANT
NEEDLE HYPO 25GX1X1/2 BEV (NEEDLE) ×2 IMPLANT
NS IRRIG 1000ML POUR BTL (IV SOLUTION) ×4 IMPLANT
PACK GENERAL/GYN (CUSTOM PROCEDURE TRAY) ×2 IMPLANT
PAD ARMBOARD 7.5X6 YLW CONV (MISCELLANEOUS) ×2 IMPLANT
PEN SKIN MARKING BROAD (MISCELLANEOUS) ×2 IMPLANT
SPECIMEN JAR X LARGE (MISCELLANEOUS) ×2 IMPLANT
SPONGE GAUZE 4X4 12PLY (GAUZE/BANDAGES/DRESSINGS) ×2 IMPLANT
SPONGE INTESTINAL PEANUT (DISPOSABLE) ×2 IMPLANT
SPONGE LAP 4X18 X RAY DECT (DISPOSABLE) ×2 IMPLANT
STAPLER VISISTAT 35W (STAPLE) ×2 IMPLANT
SUT ETHILON 2 0 FS 18 (SUTURE) ×4 IMPLANT
SUT MNCRL AB 3-0 PS2 18 (SUTURE) ×8 IMPLANT
SUT MON AB 4-0 PC3 18 (SUTURE) ×2 IMPLANT
SUT VIC AB 3-0 SH 18 (SUTURE) ×2 IMPLANT
SYR BULB 3OZ (MISCELLANEOUS) ×2 IMPLANT
SYR CONTROL 10ML LL (SYRINGE) ×2 IMPLANT
TOWEL OR 17X24 6PK STRL BLUE (TOWEL DISPOSABLE) ×2 IMPLANT
TOWEL OR 17X26 10 PK STRL BLUE (TOWEL DISPOSABLE) ×2 IMPLANT

## 2012-06-07 NOTE — Anesthesia Procedure Notes (Signed)
Procedure Name: LMA Insertion Date/Time: 06/07/2012 8:45 AM Performed by: Elizbeth Squires R Pre-anesthesia Checklist: Patient identified, Emergency Drugs available, Suction available and Patient being monitored Patient Re-evaluated:Patient Re-evaluated prior to inductionOxygen Delivery Method: Circle system utilized Preoxygenation: Pre-oxygenation with 100% oxygen Intubation Type: IV induction LMA: LMA inserted LMA Size: 4.0 Tube type: Oral Number of attempts: 1 Placement Confirmation: positive ETCO2 and breath sounds checked- equal and bilateral Tube secured with: Tape Dental Injury: Teeth and Oropharynx as per pre-operative assessment

## 2012-06-07 NOTE — H&P (View-Only) (Signed)
Patient ID: Kelly [name], female   DOB: 11/20/1960, 51 y.o.   MRN: [id number]  Chief Complaint  Patient presents with  . Breast Cancer    Right    HPI Kelly [name] is a 51 y.o. female.  She recently found a mass in the upper inner quadrant of the right breast. She went for further evaluations and was actually found to have 3 separate masses in the right breast. All were biopsied and all are invasive mammary carcinoma. Receptors were done on one of the 3 and the cancer was 100% ER positive, 0% PR positive, HER-2/neu negative, with a Ki-67 of 12%. An MRI scan confirms the 3 lesions and there is also a suspicious right axillary lymph node. The left side looks okay. Of note is that the patient's mother has had breast cancer as well as some form of non-Hodgkin's lymphoma. The patient's mother is under the care of Dr.Magrinat HPI  Past Medical History  Diagnosis Date  . Anxiety     Past Surgical History  Procedure Date  . Cesarean section     x3  . Joint replacement     Rt and Lt thumbs  . Cervical conization w/bx     Family History  Problem Relation Age of Onset  . Cancer Mother     breast, non-hodgkins lymphoma    Social History History  Substance Use Topics  . Smoking status: Never Smoker   . Smokeless tobacco: Not on file  . Alcohol Use: Yes     very rare    No Known Allergies  Current Outpatient Prescriptions  Medication Sig Dispense Refill  . buPROPion (WELLBUTRIN XL) 150 MG 24 hr tablet       . Multiple Vitamins-Minerals (MULTIVITAMIN WITH MINERALS) tablet Take 1 tablet by mouth daily.      . OVER THE COUNTER MEDICATION       . Specialty Vitamins Products (MAGNESIUM, AMINO ACID CHELATE,) 133 MG tablet Take 1 tablet by mouth 2 (two) times daily.      . traZODone (DESYREL) 50 MG tablet       . VIIBRYD 40 MG TABS         Review of Systems Review of Systems  Constitutional: Negative for fever, chills and unexpected weight change.  HENT: Negative for  hearing loss, congestion, sore throat, trouble swallowing and voice change.   Eyes: Negative for visual disturbance.  Respiratory: Negative for cough and wheezing.   Cardiovascular: Negative for chest pain, palpitations and leg swelling.  Gastrointestinal: Negative for nausea, vomiting, abdominal pain, diarrhea, constipation, blood in stool, abdominal distention and anal bleeding.  Genitourinary: Negative for hematuria, vaginal bleeding and difficulty urinating.  Musculoskeletal: Negative for arthralgias.  Skin: Negative for rash and wound.  Neurological: Positive for weakness and headaches. Negative for seizures and syncope.  Hematological: Negative for adenopathy. Does not bruise/bleed easily.  Psychiatric/Behavioral: Negative for confusion.    Blood pressure 100/62, pulse 76, temperature 97.9 F (36.6 C), temperature source Temporal, resp. rate 16, height 5' 4.5" (1.638 m), weight 118 lb (53.524 kg), last menstrual period 04/04/2012.  Physical Exam Physical Exam  Vitals reviewed. Constitutional: She is oriented to person, place, and time. She appears well-developed and well-nourished. No distress.  HENT:  Head: Normocephalic and atraumatic.  Mouth/Throat: Oropharynx is clear and moist.  Eyes: Conjunctivae normal and EOM are normal. Pupils are equal, round, and reactive to light. No scleral icterus.  Neck: Normal range of motion. Neck supple. No tracheal deviation present. No   thyromegaly present.  Cardiovascular: Normal rate, regular rhythm, normal heart sounds and intact distal pulses.  Exam reveals no gallop and no friction rub.   No murmur heard. Pulmonary/Chest: Effort normal and breath sounds normal. No respiratory distress. She has no wheezes. She has no rales.         Three hard masses as noted in breast plus abnormal R axillary LN. The patient is thin and the masses are mobile, but very superficial. The LN is not fixed nor are there matted nodes  Abdominal: Soft. Bowel sounds  are normal. She exhibits no distension and no mass. There is no tenderness. There is no rebound and no guarding.  Musculoskeletal: Normal range of motion. She exhibits no edema and no tenderness.  Lymphadenopathy:    She has no cervical adenopathy.    She has axillary adenopathy.       Right axillary: Lateral adenopathy present. No pectoral adenopathy present.       Right: No supraclavicular adenopathy present.  Neurological: She is alert and oriented to person, place, and time.  Skin: Skin is warm and dry. No rash noted. She is not diaphoretic. No erythema.  Psychiatric: She has a normal mood and affect. Her behavior is normal. Judgment and thought content normal.    Data Reviewed I have reviewed the mammogram films ultrasound films and reports with the radiologist; I have reviewed the pathology slides with the pathologist.  Assessment    Multifocal invasive mammary carcinoma right breast, at least clinical stage II    Plan    I had a long talk with the patient and her husband.I have explained the pathophysiology and staging of breast cancer with particular attention to her exact situation. We discussed the multidisciplinary approach to breast cancer which often includes both medical and radiation oncology consultations.  We also discussed surgical options for the treatment of breast cancer including lumpectomy and mastectomy with possible reconstructive surgery. In addition we talked about the evaluation and management of lymph nodes including a description of sentinel lymph node biopsy and axillary dissections. We reviewed potential complications and risks including bleeding, infection, numbness,  lymphedema, and the potential need for additional surgery.  She understands that for patients who are candidate for lumpectomy or mastectomy there is an equal survival rate with either technique, but a slightly higher local recurrence rate with lumpectomy. In addition she knows that a  lumpectomy usually requires postoperative radiation as part of the management of the breast cancer.  We have discussed the likely postoperative course and plans for followup.  I have given the patient some written information that reviewed all of these issues. I believe her questions are answered and that she has a good understanding of the issues.  I have told the patient that I don't believe that she is a candidate for immediate reconstruction under normal circumstances because I believe she'll need postmastectomy radiation. She is not a candidate for a lumpectomy under any circumstances given the multifocality of the disease and the distance between the upper inner quadrant cancer in the lower outer quadrant. I'm concerned about being able to get a negative anterior margin of the did not take the overlying skin but in a transverse incision would not heal to be closed primarily. I'm not certain any vertical incision would be closed either. One option might be to do an immediate reconstruction and take a wide area of skin.  I told patient that I also thought she needed chemotherapy she is currently very adamant   that she does not want to take chemotherapy. She does have a family history with her mother having had breast cancer and she is being diagnosed at age 51. I've recommended genetic counseling evaluation. Given the size of these tumors and positive adenopathy I think she needs staging scans and have arranged for a PET scan to be done. I also making arranges for her to see an oncologist. Once we have further evaluation and decisions we can make further plans.  I spent an hour+ with the patient and her husband and reviewing information. 40 minutes of the time was in counseling.        Lanay Zinda J 05/12/2012, 5:42 PM    

## 2012-06-07 NOTE — Anesthesia Preprocedure Evaluation (Signed)
Anesthesia Evaluation  Patient identified by MRN, date of birth, ID band Patient awake    Reviewed: Allergy & Precautions, H&P , NPO status , Patient's Chart, lab work & pertinent test results  Airway Mallampati: II  Neck ROM: full    Dental   Pulmonary          Cardiovascular     Neuro/Psych Anxiety Depression    GI/Hepatic   Endo/Other    Renal/GU      Musculoskeletal   Abdominal   Peds  Hematology   Anesthesia Other Findings   Reproductive/Obstetrics                           Anesthesia Physical Anesthesia Plan  ASA: II  Anesthesia Plan: General   Post-op Pain Management:    Induction: Intravenous  Airway Management Planned: LMA  Additional Equipment:   Intra-op Plan:   Post-operative Plan:   Informed Consent: I have reviewed the patients History and Physical, chart, labs and discussed the procedure including the risks, benefits and alternatives for the proposed anesthesia with the patient or authorized representative who has indicated his/her understanding and acceptance.     Plan Discussed with: CRNA and Surgeon  Anesthesia Plan Comments:         Anesthesia Quick Evaluation  

## 2012-06-07 NOTE — Progress Notes (Signed)
Report received from Diane RN.

## 2012-06-07 NOTE — Transfer of Care (Signed)
Immediate Anesthesia Transfer of Care Note  Patient: Kelly [name]  Procedure(s) Performed: Procedure(s) (LRB) with comments: MASTECTOMY WITH SENTINEL LYMPH NODE BIOPSY (Right) - right total mastectomy and sentinel node MASTECTOMY MODIFIED RADICAL (Right)  Patient Location: PACU  Anesthesia Type:General  Level of Consciousness: awake, alert  and oriented  Airway & Oxygen Therapy: Patient Spontanous Breathing and Patient connected to nasal cannula oxygen  Post-op Assessment: Report given to PACU RN, Post -op Vital signs reviewed and stable and Patient moving all extremities  Post vital signs: Reviewed and stable  Complications: No apparent anesthesia complications

## 2012-06-07 NOTE — Op Note (Signed)
Kelly [name] [birth year] [id number] 05/25/2012  Preoperative diagnosis: multicentric right breast cancer  Postoperative diagnosis: same, stage II  Procedure: right modified radical mastectomy with blue dye injection and axillary sentinel lymph node biopsy  Surgeon: Currie Paris    Anesthesia:General  Clinical History and Indications:The patient is seen in the holding area and we reviewed the plans for the procedure as noted above. We reviewed the risks and complications a final time. She had no further questions. I marked theright side as the operative side.  Description of Procedure: The patient was taken to the operating room. After satisfactory general anesthesia was obtained the timeout was done.   I then injected 5 cc of dilute methylene blue and injected it subareaorly and massaged it in. A full prep and drape was then done.  I outlined an elliptical incision and marked the inframammary fold and midline.by palpation, the tumor was close to the skin both at the upper inner and lower tumor which was at the inframammary fold. An outlying incision I tried to get the skin overlying the superior upper inner quadrant lesion but was unable to get the skin overlying the 1 new inframammary fold and still be able to do a primary closure. The incision was made. The usual skin flaps were raised.  I went to the clavicle superiorly and sternum medially and towards the latissimus laterally.    After the superior flap was complete I was able to use the neoprobe to locate a sentinel node.  I was able to find 2 hot blue lymph nodes and 2 hot lymph nodes are not blue.  Once the node was removed it was forwarded to pathology.   The inferior flap was then made going to the inframammary fold and out to the latissimus.flap is very thin due to the close approximation of the tumor to the dermis. However by palpation, I appeared to have normal fatty tissue overlying the tumor so I thought at least  grossly there was a negative margin. The breast was then removed from the pectoralis starting medially and working laterally. When I got to the lateral aspect of the pectoralis major muscle I opened the clavipectoral fascia. I finished removing the breast from the serratus muscles.  At this point the pathologist reported on the sentinel nodes and 2 were positive.as planned I therefore performed an x-ray dissection.  The clavipectoral fascia was opened in the x-ray contents identified. I was able to sleep the x-ray contents from medial to lateral and superior to inferior. Identify the long thoracic and thoracodorsal nerves and preserved those.I saw the axillary vein and stayed below it. There are several nodes that I thought were involved including one close to the axillary vein which was carefully dissected off of it. Once all the axillary contents were freed up I was able to complete the mastectomy. I therefore completed the mastectomy by dividing the remaining attachments from the breast to the chest wall and latissimus. The specimen was marked to orient it for the pathologist and passed off the table.  I spent several minutes irrigating and making sure everything was dry. I then placed a 57 [place] Blake drain through a small incision in the inferior flap and laid along the pectoralis muscle. It was secured with a 2-0 nylon suture.a second 56 [place] was placed into the axillary area and secured with a 2 nylon suture.  Another irrigation and check for hemostasis was made. Everything appeared to be dry. Incision was then closed with interrupted  3-0 Monocryl plus Dermabond.  The patient tolerated the procedure well. There were no operative complications. All counts were correct. Estimated blood loss was less than 100 cc. Sterile dressings were applied and the patient taken to the PACU in satisfactory condition.  Currie Paris, MD, FACS 06/07/2012 11:04 AM

## 2012-06-07 NOTE — Anesthesia Postprocedure Evaluation (Signed)
Anesthesia Post Note  Patient: Kelly [name]  Procedure(s) Performed: Procedure(s) (LRB): MASTECTOMY WITH SENTINEL LYMPH NODE BIOPSY (Right) MASTECTOMY MODIFIED RADICAL (Right)  Anesthesia type: General  Patient location: PACU  Post pain: Pain level controlled and Adequate analgesia  Post assessment: Post-op Vital signs reviewed, Patient's Cardiovascular Status Stable, Respiratory Function Stable, Patent Airway and Pain level controlled  Last Vitals:  Filed Vitals:   06/07/12 1217  BP:   Pulse: 90  Temp:   Resp: 18    Post vital signs: Reviewed and stable  Level of consciousness: awake, alert  and oriented  Complications: No apparent anesthesia complications

## 2012-06-07 NOTE — Interval H&P Note (Signed)
History and Physical Interval Note:  06/07/2012 8:15 AM  Kelly [name]  has presented today for surgery, with the diagnosis of multifocal right breast cancer  The various methods of treatment have been discussed with the patient and family. After consideration of risks, benefits and other options for treatment, the patient has consented to  Procedure(s) (LRB) with comments: MASTECTOMY WITH SENTINEL LYMPH NODE BIOPSY (Right) - right total mastectomy and sentinel node as a surgical intervention .  The patient's history has been reviewed, patient examined, no change in status, stable for surgery.  I have reviewed the patient's chart and labs.  Questions were answered to the patient's satisfaction.   Right breast is marked as the operative side  Orma Cheetham J

## 2012-06-07 NOTE — Progress Notes (Signed)
Received patient post right mastectomy, JP intact, VSS,Will monitor.

## 2012-06-08 ENCOUNTER — Encounter (HOSPITAL_COMMUNITY): Payer: Self-pay | Admitting: Surgery

## 2012-06-08 MED ORDER — OXYCODONE-ACETAMINOPHEN 5-325 MG PO TABS: 1.0000 | ORAL_TABLET | ORAL | Status: DC | PRN

## 2012-06-08 NOTE — Progress Notes (Signed)
Patient discharged to home in care of spouse. Medications and instructions reviewed with patient and spouse with all questions answered. IV d/c'd with cath intact. Dressing CDI. Assessment unchanged from this am. Drain care discussed with patient and spouse with return demonstration. Patient is to follow up with Dr. Jamey Ripa in 1 week.

## 2012-06-08 NOTE — Progress Notes (Signed)
UR complete 

## 2012-06-08 NOTE — Progress Notes (Signed)
1 Day Post-Op   Assessment: s/p Procedure(s): MASTECTOMY WITH SENTINEL LYMPH NODE BIOPSY MASTECTOMY MODIFIED RADICAL Patient Active Problem List  Diagnosis  . Right breast cancer, IDC, multicentric   Ready to be discahrged  Plan: Discharge  Subjective: Sore, but pain controlled with PO meds. Wants to go home if possible  Objective: Vital signs in last 24 hours: Temp:  [97.3 F (36.3 C)-98.5 F (36.9 C)] 98.5 F (36.9 C) (11/12 0950) Pulse Rate:  [60-100] 100  (11/12 0950) Resp:  [10-18] 16  (11/12 0950) BP: (77-104)/(42-59) 81/46 mmHg (11/12 0950) SpO2:  [95 %-100 %] 95 % (11/12 0950) Weight:  [120 lb (54.432 kg)] 120 lb (54.432 kg) (11/11 1354)   Intake/Output from previous day: 11/11 0701 - 11/12 0700 In: 2883.8 [I.V.:2883.8] Out: 1375 [Urine:900; Drains:400; Blood:75] Intake/Output this shift: Total I/O In: 360 [P.O.:360] Out: 260 [Urine:200; Drains:60]   General appearance: alert, cooperative and no distress Resp: clear to auscultation bilaterally  Incision: Flaps viable, no hematoma, JP serous to slt pink  Lab Results:  No results found for this basename: WBC:2,HGB:2,HCT:2,PLT:2 in the last 72 hours BMET No results found for this basename: NA:2,K:2,CL:2,CO2:2,GLUCOSE:2,BUN:2,CREATININE:2,CALCIUM:2 in the last 72 hours PT/INR No results found for this basename: LABPROT:2,INR:2 in the last 72 hours ABG No results found for this basename: PHART:2,PCO2:2,PO2:2,HCO3:2 in the last 72 hours  MEDS, Scheduled    . buPROPion  150 mg Oral Daily  . heparin  5,000 Units Subcutaneous Q8H  . [EXPIRED] HYDROmorphone      . traZODone  150 mg Oral QHS  . [DISCONTINUED] chlorhexidine  1 application Topical Once  . [DISCONTINUED] chlorhexidine  1 application Topical Once  . [DISCONTINUED] midazolam  2 mg Intravenous Once    Studies/Results: Nm Sentinel Node Inj-no Rpt (breast)  06/07/2012  CLINICAL DATA: right breast cancer   Sulfur colloid was injected  intradermally by the nuclear medicine  technologist for breast cancer sentinel node localization.        LOS: 1 day     Currie Paris, MD, [facility], [place] [phone]   06/08/2012 12:29 PM            js

## 2012-06-09 ENCOUNTER — Telehealth (INDEPENDENT_AMBULATORY_CARE_PROVIDER_SITE_OTHER): Payer: Self-pay | Admitting: Surgery

## 2012-06-09 ENCOUNTER — Emergency Department (HOSPITAL_COMMUNITY)
Admission: EM | Admit: 2012-06-09 | Discharge: 2012-06-09 | Disposition: A | Discharge status: Home / Self Care | Payer: Managed Care, Other (non HMO) | Attending: Emergency Medicine | Admitting: Emergency Medicine

## 2012-06-09 ENCOUNTER — Encounter (HOSPITAL_COMMUNITY): Payer: Self-pay | Admitting: Emergency Medicine

## 2012-06-09 ENCOUNTER — Telehealth (INDEPENDENT_AMBULATORY_CARE_PROVIDER_SITE_OTHER): Payer: Self-pay | Admitting: General Surgery

## 2012-06-09 DIAGNOSIS — F411 Generalized anxiety disorder: Secondary | ICD-10-CM | POA: Insufficient documentation

## 2012-06-09 DIAGNOSIS — F329 Major depressive disorder, single episode, unspecified: Secondary | ICD-10-CM | POA: Insufficient documentation

## 2012-06-09 DIAGNOSIS — R339 Retention of urine, unspecified: Secondary | ICD-10-CM | POA: Insufficient documentation

## 2012-06-09 DIAGNOSIS — K5900 Constipation, unspecified: Secondary | ICD-10-CM | POA: Insufficient documentation

## 2012-06-09 DIAGNOSIS — F3289 Other specified depressive episodes: Secondary | ICD-10-CM | POA: Insufficient documentation

## 2012-06-09 DIAGNOSIS — Z79899 Other long term (current) drug therapy: Secondary | ICD-10-CM | POA: Insufficient documentation

## 2012-06-09 LAB — URINALYSIS, ROUTINE W REFLEX MICROSCOPIC
Bilirubin Urine: NEGATIVE
Glucose, UA: NEGATIVE mg/dL
Hgb urine dipstick: NEGATIVE
Ketones, ur: NEGATIVE mg/dL
Leukocytes, UA: NEGATIVE
Nitrite: NEGATIVE
Protein, ur: NEGATIVE mg/dL
Specific Gravity, Urine: 1.012 (ref 1.005–1.030)
Urobilinogen, UA: 0.2 mg/dL (ref 0.0–1.0)
pH: 7.5 (ref 5.0–8.0)

## 2012-06-09 MED ORDER — POLYETHYLENE GLYCOL 3350 17 GM/SCOOP PO POWD: 17.0000 g | Freq: Every day | ORAL | Status: DC

## 2012-06-09 MED ORDER — BISACODYL 10 MG RE SUPP: 10.0000 mg | Freq: Once | RECTAL | Status: DC

## 2012-06-09 MED ORDER — BISACODYL 10 MG RE SUPP
10.0000 mg | Freq: Once | RECTAL | Status: AC
  Administered 2012-06-09: 10 mg via RECTAL
  Filled 2012-06-09: qty 1

## 2012-06-09 NOTE — ED Provider Notes (Signed)
History     CSN: [id number]  Arrival date & time 06/09/12  0435   First MD Initiated Contact with Patient 06/09/12 0450      Chief Complaint  Patient presents with  . Urinary Retention    (Consider location/radiation/quality/duration/timing/severity/associated sxs/prior treatment) HPI Comments: Patient had mastectomy on Monday discharged form the hospital yesterday and had been unable to urinate. States has not taken any narcotic pain medication since DC, has not a BM in 4 days is eating and drinking well,  denies fevers   The history is provided by the patient.    Past Medical History  Diagnosis Date  . Anxiety   . Depression     Past Surgical History  Procedure Date  . Cesarean section     x3  . Joint replacement     Rt and Lt thumbs  . Cervical conization w/bx   . Breast surgery     several biopsies, needle aspiration  . Mastectomy w/ sentinel node biopsy 06/07/2012    Procedure: MASTECTOMY WITH SENTINEL LYMPH NODE BIOPSY;  Surgeon: Currie Paris, MD;  Location: [facility] OR;  Service: General;  Laterality: Right;  right total mastectomy and sentinel node  . Mastectomy modified radical 06/07/2012    Procedure: MASTECTOMY MODIFIED RADICAL;  Surgeon: Currie Paris, MD;  Location: [facility] OR;  Service: General;  Laterality: Right;    Family History  Problem Relation Age of Onset  . Cancer Mother     breast, non-hodgkins lymphoma    History  Substance Use Topics  . Smoking status: Never Smoker   . Smokeless tobacco: Not on file  . Alcohol Use: Yes     Comment: very rare    OB History    Grav Para Term Preterm Abortions TAB SAB Ect Mult Living                  Review of Systems  Constitutional: Negative for fever and chills.  HENT: Negative.   Eyes: Negative.   Respiratory: Negative for cough.   Cardiovascular: Negative for chest pain and leg swelling.  Gastrointestinal: Positive for constipation. Negative for nausea and vomiting.  Genitourinary:  Negative for dysuria and enuresis.  Musculoskeletal: Negative for back pain.  Neurological: Negative for weakness.    Allergies  Review of patient's allergies indicates no known allergies.  Home Medications   Current Outpatient Rx  Name  Route  Sig  Dispense  Refill  . BUPROPION HCL ER (XL) 150 MG PO TB24   Oral   Take 150 mg by mouth daily.          . MULTI-VITAMIN/MINERALS PO TABS   Oral   Take 1 tablet by mouth daily.         . OXYCODONE-ACETAMINOPHEN 5-325 MG PO TABS   Oral   Take 1-2 tablets by mouth every 4 (four) hours as needed.   30 tablet   0   . TRAZODONE HCL 50 MG PO TABS   Oral   Take 150 mg by mouth at bedtime.          Marland Kitchen VIIBRYD 40 MG PO TABS   Oral   Take 40 mg by mouth daily.            BP 98/59  Pulse 73  Temp 98.8 F (37.1 C) (Oral)  Resp 18  SpO2 100%  LMP 05/10/2012  Physical Exam  Constitutional: She is oriented to person, place, and time. She appears well-developed and well-nourished.  HENT:  Head: Normocephalic.  Eyes: Pupils are equal, round, and reactive to light.  Neck: Normal range of motion.  Cardiovascular: Normal rate.   Pulmonary/Chest: Effort normal. No respiratory distress.       2 JP drains in place R breast area   Abdominal: She exhibits distension.  Genitourinary: Vagina normal. Rectal exam shows no external hemorrhoid, no internal hemorrhoid and anal tone normal.       Large amount firm stool in rectal vault   Musculoskeletal: Normal range of motion.  Neurological: She is alert and oriented to person, place, and time.  Skin: No rash noted. She is diaphoretic.    ED Course  Procedures (including critical care time)   Labs Reviewed  URINALYSIS, ROUTINE W REFLEX MICROSCOPIC   Nm Sentinel Node Inj-no Rpt (breast)  06/07/2012  CLINICAL DATA: right breast cancer   Sulfur colloid was injected intradermally by the nuclear medicine  technologist for breast cancer sentinel node localization.       No  diagnosis found.    MDM   650 cc urine immediately drained form bladder no longer distended  Firm stool in rectal vault will give Dulcolax suppository         Arman Filter, NP 06/09/12 [phone]

## 2012-06-09 NOTE — ED Notes (Signed)
Pt states she had a mastectomy on Monday and was sent home yesterday  Pt states she has been unable to urinate since 11pm last night

## 2012-06-09 NOTE — ED Notes (Signed)
Pt alert and oriented x4. Respirations even and unlabored, bilateral symmetrical rise and fall of chest. Skin warm and dry. In no acute distress. Denies needs.   

## 2012-06-09 NOTE — ED Provider Notes (Signed)
Medical screening examination/treatment/procedure(s) were performed by non-physician practitioner and as supervising physician I was immediately available for consultation/collaboration. Laquana Villari, MD, FACEP   Kyleen Villatoro L Martasia Talamante, MD 06/09/12 0641 

## 2012-06-09 NOTE — Telephone Encounter (Signed)
I reviewed her pathology report with her husband by telephone today. I explained her that she now has a stage IIIa cancer.  Of note is that she developed some constipation and urinary retention and now has a Foley catheter. She is planning to see the medical oncologist next week. I told her we will put consultation requested for a radiation oncologist.

## 2012-06-09 NOTE — ED Notes (Signed)
Pt escorted to d/c window. Pt and family verbalized d/c instructions. In no acute distress 

## 2012-06-09 NOTE — Telephone Encounter (Signed)
Pt called to report two problems to Dr. Jamey Ripa.  First, she has pain going down both legs.  It is intermittent, "aching like when you have the flu" type pain.  Second, she was "allowed to leave the hospital on Tuesday without anyone asking if she had a bowel movement."  She reported she was so constipated by the Percocet, and was straining so hard to have a BM, it caused her to be unable to urinate.  She went to [facility] ER last night and they placed an in-dwelling catheter (for removal Friday--nurse only appt.)  The ER also ordered stool softener and Miralax.  She is taking Ibuprofen for pain relief; discussed dosage increase or to try Aleve.  She wanted Dr. Jamey Ripa to be aware of all.

## 2012-06-09 NOTE — ED Notes (Signed)
NP at bedside.

## 2012-06-09 NOTE — ED Notes (Signed)
PA at bedside.

## 2012-06-10 ENCOUNTER — Telehealth (INDEPENDENT_AMBULATORY_CARE_PROVIDER_SITE_OTHER): Payer: Self-pay | Admitting: General Surgery

## 2012-06-10 DIAGNOSIS — C50919 Malignant neoplasm of unspecified site of unspecified female breast: Secondary | ICD-10-CM

## 2012-06-10 NOTE — Telephone Encounter (Signed)
Referral sent to Tami.

## 2012-06-11 ENCOUNTER — Ambulatory Visit (INDEPENDENT_AMBULATORY_CARE_PROVIDER_SITE_OTHER): Payer: Managed Care, Other (non HMO) | Admitting: General Surgery

## 2012-06-11 DIAGNOSIS — Z466 Encounter for fitting and adjustment of urinary device: Secondary | ICD-10-CM

## 2012-06-11 NOTE — Progress Notes (Signed)
Patient comes in for a foley catheter removal. 10 cc of fluid was removed from catheter and check twice. Catheter was removed by Marlowe Aschoff, RN intact and without difficulty. Patient has no complaints. Patient instructed to drink lots of water and let us know if she has not urinated after 6 hours. She will call if needed and keep appt next Tuesday. She asked to see Dr Jamey Ripa. He checked her mastectomy scar and everything looks good. Wound intact, no drainage, no redness. Serosanguinous fluid from both drains. Drain one draining less than drain two. Patient will keep follow up appt with Dr Jamey Ripa next Tuesday.

## 2012-06-11 NOTE — Patient Instructions (Signed)
Call if you can not urinate in 6 hours. Keep follow up on Tuesday.

## 2012-06-13 ENCOUNTER — Other Ambulatory Visit: Payer: Self-pay | Admitting: Oncology

## 2012-06-14 ENCOUNTER — Ambulatory Visit: Payer: Managed Care, Other (non HMO) | Admitting: Oncology

## 2012-06-14 NOTE — Discharge Summary (Signed)
Patient ID: Kelly [name] [id number] 51 y.o. April 27, 1961  Admission  Date: 06/07/2012  Discharge date and time: 06/08/2012  2:52 PM  Admitting Physician: Currie Paris  Discharge Physician: Currie Paris  Admission Diagnoses: multifocal right breast cancer  Discharge Diagnoses: Right breast cancer, Stage III, multifocal  Operations: Procedure(s): MASTECTOMY WITH SENTINEL LYMPH NODE BIOPSY MASTECTOMY MODIFIED RADICAL  Discharged Condition: good  Hospital Course: Patient did well post op and ready to discharge the am after surgery  Consults: None  Significant Diagnostic Studies: Path: FINAL DIAGNOSIS Diagnosis 1. Lymph node, sentinel, biopsy, Right - ONE LYMPH NODE, POSITIVE FOR METASTATIC MAMMARY CARCINOMA (1/1) - TUMOR DEPOSIT IS 6 MM. - NO EXTRACAPSULAR TUMOR EXTENSION IDENTIFIED. 2. Lymph node, sentinel, biopsy, Right - ONE LYMPH NODE, POSITIVE FOR METASTATIC MAMMARY CARCINOMA (1/1). - TUMOR DEPOSIT IS 3 MM. - NO EXTRACAPSULAR TUMOR EXTENSION IDENTIFIED. 3. Lymph node, sentinel, biopsy, Right 1 of 5 Amended copy Amended FINAL for Kelly [name], Kelly [name] [phone]) Diagnosis(continued) - ONE LYMPH NODE, POSITIVE FOR METASTATIC MAMMARY CARCINOMA (1/1). - TUMOR DEPOSIT IS 4 MM. - FOCAL EXTRACAPSULAR TUMOR EXTENSION IDENTIFIED. 4. Lymph node, sentinel, biopsy, Right - ONE LYMPH NODE, NEGATIVE FOR TUMOR (0/1) 5. 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 SEE COMMENT. - LYMPHOVASCULAR INVASION IDENTIFIED. - INVASIVE TUMOR IS 0.1 CM FROM NEAREST MARGIN (DEEP). - DUCTAL CARCINOMA IN SITU GRADE II. TUMOR # 2 (INFERIOR LATERAL) - INVASIVE DUCTAL CARCINOMA, GRADE I (3.0 CM) - LYMPHOVASCULAR INVASION IDENTIFIED - PERINEURAL INVASION IDENTIFIED. - INVASIVE TUMOR IS 0.3 CM FROM NEAREST MARGIN (DEEP). - DUCTAL CARCINOMA IN SITU, GRADE II. TUMOR # 3 (SUPERIOR MID-MEDIAL). - INVASIVE  DUCTAL CARCINOMA GRADE II (2.3 CM). - LYMPHOVASCULAR INVASION IDENTIFIED. - INVASIVE TUMOR IS 0.6 CM FROM NEAREST MARGIN (DEEP). - DUCTAL CARCINOMA IN SITU, GRADE II. - FIVE LYMPH NODES, POSITIVE FOR METASTATIC MAMMARY CARCINOMA (5/14) - TUMOR DEPOSITS ARE 5 MM, 4 MM, 7 MM, 7 MM AND 1.0 CM. - MULTIPLE FOCI OF EXTRACAPSULAR TUMOR EXTENSION IDENTIFIED. - SEE TUMOR SYNOPTIC TEMPLATE BELOW. 6. Lymph node, biopsy, Right axilla - ONE LYMPH NODE, POSITIVE FOR METASTATIC MAMMARY CARCINOMA (1/1). - TUMOR DEPOSIT IS 2 MM. - NO EXTRACAPSULAR TUMOR EXTENSION IDENTIFIED.   Disposition: Home

## 2012-06-15 ENCOUNTER — Ambulatory Visit (INDEPENDENT_AMBULATORY_CARE_PROVIDER_SITE_OTHER): Payer: Managed Care, Other (non HMO) | Admitting: Surgery

## 2012-06-15 ENCOUNTER — Encounter (INDEPENDENT_AMBULATORY_CARE_PROVIDER_SITE_OTHER): Payer: Self-pay | Admitting: Surgery

## 2012-06-15 VITALS — BP 130/62 | HR 72 | Temp 97.3°F | Resp 16 | Ht 64.0 in | Wt 117.0 lb

## 2012-06-15 DIAGNOSIS — Z09 Encounter for follow-up examination after completed treatment for conditions other than malignant neoplasm: Secondary | ICD-10-CM

## 2012-06-15 NOTE — Progress Notes (Signed)
Kelly [name]    [id number] 06/15/2012    [birth year]   CC: Post op Right MRM  HPI: The patient returns for post op follow-up. She underwent a Right MRM  on 06/07/2012. Over all she feels that she is doing well. No particulary issuies  PE: VITAL SIGNS: BP 130/62  Pulse 72  Temp 97.3 F (36.3 C) (Temporal)  Resp 16  Ht 5\' 4"  (1.626 m)  Wt 117 lb (53.071 kg)  BMI 20.08 kg/m2  LMP 05/10/2012  The incision is healing nicely and there is no evidence of infection or hematoma.  The drains are slowing and the medial can be removed.  DATA REVIEWED: Pathology report showed  Three separate cancers, 9/19 + LN with perineural and mymphovascualr invasion  IMPRESSION: Patient doing well.   PLAN: Her next visit will be in two weeks, but sooner if drain slows. Removed the medial drain, gave her a copy of path and reviewed with her.

## 2012-06-15 NOTE — Patient Instructions (Signed)
Call when the last drain is less than 30 cc in 24 hours.  See me again in about two weeks

## 2012-06-16 ENCOUNTER — Encounter: Payer: Self-pay | Admitting: Radiation Oncology

## 2012-06-16 ENCOUNTER — Ambulatory Visit
Admission: RE | Admit: 2012-06-16 | Discharge: 2012-06-16 | Disposition: A | Discharge status: Home / Self Care | Payer: Managed Care, Other (non HMO) | Source: Ambulatory Visit | Attending: Radiation Oncology | Admitting: Radiation Oncology

## 2012-06-16 VITALS — BP 101/53 | HR 76 | Temp 98.8°F | Resp 20 | Wt 116.2 lb

## 2012-06-16 DIAGNOSIS — C50911 Malignant neoplasm of unspecified site of right female breast: Secondary | ICD-10-CM

## 2012-06-16 DIAGNOSIS — C50919 Malignant neoplasm of unspecified site of unspecified female breast: Secondary | ICD-10-CM | POA: Insufficient documentation

## 2012-06-16 HISTORY — DX: Malignant neoplasm of unspecified site of unspecified female breast: C50.919

## 2012-06-16 NOTE — Addendum Note (Signed)
Encounter addended by: Billie Lade, MD on: 06/16/2012  8:01 PM<BR>     Documentation filed: Notes Section

## 2012-06-16 NOTE — Progress Notes (Signed)
Please see the Nurse Progress Note in the MD Initial Consult Encounter for this patient. 

## 2012-06-16 NOTE — Progress Notes (Signed)
Pt here w/husband today. She does volunteer work. Pt reports tenderness of right breast, generalized pain in joints re: arthritis. Some loss of appetite and fatigue.

## 2012-06-16 NOTE — Progress Notes (Signed)
Radiation Oncology         ([phone]) [phone] ________________________________  Initial outpatient Consultation  Name: ADALIN VANDERPLOEG MRN: [id number]  Date: 06/16/2012  DOB: 1961-05-04  DG:LOVFIE, Marda Stalker, MD  Streck, Reola Mosher, MD   REFERRING PHYSICIAN: Currie Paris, MD  DIAGNOSIS: The primary encounter diagnosis was Breast cancer, right breast. A diagnosis of Right breast cancer, IDC, multicentric was also pertinent to this visit.  HISTORY OF PRESENT ILLNESS::Rasheda L Verma is a 51 y.o. female who is  Is seen out of the courtesy of Dr. Cicero Duck for an opinion concerning radiation therapy as part of management of patient's advanced right breast cancer.  earlier this year the patient palpated a lump in the upper-inner aspect of her right breast. On the imaging she ultimately was found to have 3 separate areas as well as a suspicious right axillary lymph node. She did undergo biopsy of all 3 lesions confirming malignancy. Given the multicentric nature of the patient's malignancy she was not felt to be a candidate for breast conserving therapy.  she was taken to the operating room on November 11 at which time she underwent a right modified right mastectomy with sentinel lymph node biopsies. The patient was found to have 3 separate invasive ductal carcinomas within the right breast.  these lesions measured 2.2 cm, 3.0 cm, 2.3 cms.  the sentinel node specimen showed 3 positive lymph nodes and the completion axillary dissection revealed an additional 5 positive lymph nodes. She is now seen to be considered for postmastectomy irradiation.  She will be seen in medical oncology later this week, however voices  that she will is doubtful  she will consider adjuvant chemotherapy for her overall management. The tumors are estrogen and progesterone receptor positive at 100% and 98% respectively.  PREVIOUS RADIATION THERAPY: No  PAST MEDICAL HISTORY:  has a past medical history of Anxiety;  Depression; and Breast cancer (05/05/12).    PAST SURGICAL HISTORY: Past Surgical History  Procedure Date  . Cesarean section     x3  . Joint replacement     Rt and Lt thumbs  . Cervical conization w/bx   . Breast surgery     several biopsies, needle aspiration  . Mastectomy w/ sentinel node biopsy 06/07/2012    Procedure: MASTECTOMY WITH SENTINEL LYMPH NODE BIOPSY;  Surgeon: Currie Paris, MD;  Location: [facility] OR;  Service: General;  Laterality: Right;  right total mastectomy and sentinel node  . Mastectomy modified radical 06/07/2012    Procedure: MASTECTOMY MODIFIED RADICAL;  Surgeon: Currie Paris, MD;  Location: [facility] OR;  Service: General;  Laterality: Right;    FAMILY HISTORY: family history includes Bipolar disorder in her brother; Cancer in her mother; Gout in her brother; and Stroke in her maternal grandfather.  SOCIAL HISTORY:  reports that she has never smoked. She has never used smokeless tobacco. She reports that she drinks alcohol. She reports that she does not use illicit drugs.  ALLERGIES: Review of patient's allergies indicates no known allergies.  MEDICATIONS:  Current Outpatient Prescriptions  Medication Sig Dispense Refill  . bisacodyl (DULCOLAX) 10 MG suppository Place 1 suppository (10 mg total) rectally once.  6 suppository  0  . buPROPion (WELLBUTRIN XL) 150 MG 24 hr tablet Take 150 mg by mouth daily.       Marland Kitchen ibuprofen (ADVIL,MOTRIN) 200 MG tablet Take 600 mg by mouth every 6 (six) hours as needed. For pain      . polyethylene glycol powder (  MIRALAX) powder Take 17 g by mouth daily.  255 g  0  . traZODone (DESYREL) 50 MG tablet Take 150 mg by mouth at bedtime. scheduled      . VIIBRYD 40 MG TABS Take 40 mg by mouth daily.         REVIEW OF SYSTEMS:  A 15 point review of systems is documented in the electronic medical record. This was obtained by the nursing staff. However, I reviewed this with the patient to discuss relevant findings and make appropriate  changes.  The patient palpated the upper lump on self-examination. She denied any pain in the breast area, nipple discharge or bleeding. Patient denies any new bony pain headaches dizziness or blurred vision. Patient has discomfort along her right chest wall as expected at this time.   PHYSICAL EXAM:  weight is 116 lb 3.2 oz (52.708 kg). Her oral temperature is 98.8 F (37.1 C). Her blood pressure is 101/53 and her pulse is 76. Her respiration is 20.   BP 101/53  Pulse 76  Temp 98.8 F (37.1 C) (Oral)  Resp 20  Wt 116 lb 3.2 oz (52.708 kg)  LMP 06/10/2012  General Appearance:    Alert, cooperative, no distress, appears stated age  Head:    Normocephalic, without obvious abnormality, atraumatic  Eyes:    PERRL, conjunctiva/corneas clear, EOM's intact,      Ears:    Normal TM's and external ear canals, both ears  Nose:   Nares normal, septum midline, mucosa normal, no drainage    or sinus tenderness  Throat:   Lips, mucosa, and tongue normal; teeth and gums normal  Neck:   Supple, symmetrical, trachea midline, no adenopathy;    thyroid:  no enlargement/tenderness/nodules; no carotid   bruit or JVD  Back:     Symmetric, no curvature, ROM normal, no CVA tenderness  Lungs:     Clear to auscultation bilaterally, respirations unlabored  Chest Wall:    right mastectomy scar with one JP drain remaining in place. there are no signs of infection or drainage from the right chest region.    Heart:    Regular rate and rhythm, , no murmur, rub   or gallop  Breast Exam:    No tenderness, masses, or nipple abnormality along the left breast area   Abdomen:     Soft, non-tender, bowel sounds active all four quadrants,    no masses, no organomegaly        Extremities:   Extremities normal, atraumatic, no cyanosis or edema  Pulses:   2+ and symmetric all extremities  Skin:   Skin color, texture, turgor normal, no rashes or lesions  Lymph nodes:   Cervical, supraclavicular, and axillary nodes normal    Neurologic:   CNII-XII intact, normal strength, sensation and reflexes    throughout    LABORATORY DATA:  Lab Results  Component Value Date   WBC 6.6 06/02/2012   HGB 12.8 06/02/2012   HCT 37.5 06/02/2012   MCV 90.6 06/02/2012   PLT 234 06/02/2012   Lab Results  Component Value Date   NA 141 05/21/2012   K 3.8 05/21/2012   CL 103 05/21/2012   CO2 28 05/21/2012   Lab Results  Component Value Date   ALT 16 05/21/2012   AST 18 05/21/2012   ALKPHOS 73 05/21/2012   BILITOT 1.30* 05/21/2012     RADIOGRAPHY: Nm Sentinel Node Inj-no Rpt (breast)  06/07/2012  CLINICAL DATA: right breast cancer   Sulfur  colloid was injected intradermally by the nuclear medicine  technologist for breast cancer sentinel node localization.        IMPRESSION: Locally advanced multicentric invasive ductal carcinoma of the right breast, mpT2, pN2a, Mx.  Patient would be at risk for local regional recurrence and I would recommend postmastectomy irradiation in this situation I discussed the treatment course side effects and potential toxicities of radiation therapy with the patient and her husband. She appears to understand and wishes to proceed with treatment.  PLAN: Patient will be seen in medical oncology the near future. As above she appears to be hesitant in considering adjuvant chemotherapy.  I have tentatively schedule her for simulation in approximately 2 weeks with treatments to begin approximately 5-6 weeks postop. I anticipate 6 and half weeks of radiation therapy.  I spent 60 minutes minutes face to face with the patient and more than 50% of that time was spent in counseling and/or coordination of care.     -----------------------------------  Billie Lade, PhD, MD

## 2012-06-18 ENCOUNTER — Encounter (INDEPENDENT_AMBULATORY_CARE_PROVIDER_SITE_OTHER): Payer: Managed Care, Other (non HMO)

## 2012-06-18 ENCOUNTER — Ambulatory Visit (HOSPITAL_BASED_OUTPATIENT_CLINIC_OR_DEPARTMENT_OTHER): Payer: Managed Care, Other (non HMO) | Admitting: Oncology

## 2012-06-18 ENCOUNTER — Ambulatory Visit (INDEPENDENT_AMBULATORY_CARE_PROVIDER_SITE_OTHER): Payer: Managed Care, Other (non HMO) | Admitting: Surgery

## 2012-06-18 ENCOUNTER — Other Ambulatory Visit (HOSPITAL_BASED_OUTPATIENT_CLINIC_OR_DEPARTMENT_OTHER): Payer: Managed Care, Other (non HMO) | Admitting: Lab

## 2012-06-18 ENCOUNTER — Other Ambulatory Visit: Payer: Self-pay | Admitting: *Deleted

## 2012-06-18 VITALS — BP 85/54 | HR 67 | Temp 98.4°F | Resp 20 | Ht 64.0 in | Wt 118.0 lb

## 2012-06-18 VITALS — BP 110/60 | HR 60 | Temp 97.0°F | Resp 18 | Ht 64.0 in | Wt 119.0 lb

## 2012-06-18 DIAGNOSIS — C50919 Malignant neoplasm of unspecified site of unspecified female breast: Secondary | ICD-10-CM

## 2012-06-18 DIAGNOSIS — C50519 Malignant neoplasm of lower-outer quadrant of unspecified female breast: Secondary | ICD-10-CM

## 2012-06-18 DIAGNOSIS — C50911 Malignant neoplasm of unspecified site of right female breast: Secondary | ICD-10-CM

## 2012-06-18 DIAGNOSIS — C773 Secondary and unspecified malignant neoplasm of axilla and upper limb lymph nodes: Secondary | ICD-10-CM

## 2012-06-18 DIAGNOSIS — C50219 Malignant neoplasm of upper-inner quadrant of unspecified female breast: Secondary | ICD-10-CM

## 2012-06-18 DIAGNOSIS — Z09 Encounter for follow-up examination after completed treatment for conditions other than malignant neoplasm: Secondary | ICD-10-CM

## 2012-06-18 DIAGNOSIS — C50419 Malignant neoplasm of upper-outer quadrant of unspecified female breast: Secondary | ICD-10-CM

## 2012-06-18 LAB — COMPREHENSIVE METABOLIC PANEL (CC13)
ALT: 15 U/L (ref 0–55)
AST: 17 U/L (ref 5–34)
Albumin: 3.4 g/dL — ABNORMAL LOW (ref 3.5–5.0)
Alkaline Phosphatase: 78 U/L (ref 40–150)
BUN: 18 mg/dL (ref 7.0–26.0)
CO2: 33 mEq/L — ABNORMAL HIGH (ref 22–29)
Calcium: 9.5 mg/dL (ref 8.4–10.4)
Chloride: 105 mEq/L (ref 98–107)
Creatinine: 0.9 mg/dL (ref 0.6–1.1)
Glucose: 96 mg/dl (ref 70–99)
Potassium: 3.8 mEq/L (ref 3.5–5.1)
Sodium: 142 mEq/L (ref 136–145)
Total Bilirubin: 0.34 mg/dL (ref 0.20–1.20)
Total Protein: 6.6 g/dL (ref 6.4–8.3)

## 2012-06-18 LAB — CBC WITH DIFFERENTIAL/PLATELET
BASO%: 0.6 % (ref 0.0–2.0)
Basophils Absolute: 0 10*3/uL (ref 0.0–0.1)
EOS%: 5.8 % (ref 0.0–7.0)
Eosinophils Absolute: 0.4 10*3/uL (ref 0.0–0.5)
HCT: 33 % — ABNORMAL LOW (ref 34.8–46.6)
HGB: 11.6 g/dL (ref 11.6–15.9)
LYMPH%: 16.9 % (ref 14.0–49.7)
MCH: 32.2 pg (ref 25.1–34.0)
MCHC: 35.1 g/dL (ref 31.5–36.0)
MCV: 91.8 fL (ref 79.5–101.0)
MONO#: 0.4 10*3/uL (ref 0.1–0.9)
MONO%: 5.8 % (ref 0.0–14.0)
NEUT#: 5 10*3/uL (ref 1.5–6.5)
NEUT%: 70.9 % (ref 38.4–76.8)
Platelets: 309 10*3/uL (ref 145–400)
RBC: 3.59 10*6/uL — ABNORMAL LOW (ref 3.70–5.45)
RDW: 13.1 % (ref 11.2–14.5)
WBC: 7 10*3/uL (ref 3.9–10.3)
lymph#: 1.2 10*3/uL (ref 0.9–3.3)

## 2012-06-18 NOTE — Patient Instructions (Signed)
If you thinks there is some fluid developing in the incision area call the office next week. Otherwise I will plan to see you in 2 or 3 weeks.      Kelly [name]  is OK to attend the [employer] Currie Paris, MD, [facility], [place] [phone] 06/18/2012 11:42 AM

## 2012-06-18 NOTE — Progress Notes (Signed)
BRIANA FARNER    [id number] 06/18/2012    May 16, 1961   CC: Post op Right MRM  HPI: The patient returns for post op follow-up. She underwent a Right MRM  on 06/07/2012. Over all she feels that she is doing well. No particulary issuies  PE: VITAL SIGNS: BP 110/60  Pulse 60  Temp 97 F (36.1 C) (Temporal)  Resp 18  Ht 5\' 4"  (1.626 m)  Wt 119 lb (53.978 kg)  BMI 20.43 kg/m2  LMP 06/10/2012  The incision is healing nicely and there is no evidence of infection or hematoma.  The drain is less than 25cc/day   IMPRESSION: Patient doing well.   PLAN: She will be seen next week if she thinks she is getting more fluid Drain removed

## 2012-06-19 ENCOUNTER — Other Ambulatory Visit: Payer: Self-pay | Admitting: Oncology

## 2012-06-19 LAB — CANCER ANTIGEN 27.29: CA 27.29: 31 U/mL (ref 0–39)

## 2012-06-19 NOTE — Progress Notes (Signed)
ID: Kelly [name]   DOB: 1960/11/29  MR#: [id number]  WUJ#:[id number]  PCP:  [name] GYN: Annamaria Boots, SU: Cyndia Bent OTHER MD: Antony Blackbird   HISTORY OF PRESENT ILLNESS: The patient herself noted a change in her right breast. She brought it to her primary care physician, Dr. Ronalee Red attention, and diagnostic mammography was obtained at [facility] 05/05/2012. The breasts were found to be heterogeneously dense. There was a spiculated mass posteriorly and laterally to the nipple line in the right breast, and a second mass more medially. There was an asymmetric soft tissue density in the upper outer quadrant of the left breast, without any discrete mass identified. There was a cluster of microcalcifications medially in the left breast. Ultrasound of the right breast showed a lobulated mass measuring 2.3 cm and a second mass measuring 4.5 cm. There was also a third mass measuring 1.9 cm which was felt possibly to represent a lymph node. In the right axilla there was a suspicious lymph node measuring 1.1 cm.  Biopsy of this 3 breast masses was obtained the same day, and showed all 3 to represent invasive ductal carcinoma, grade 1. Because the 3 lesions were morphologically similar, only one prognostic panel was obtained (from the mass at "7:00"), showing it to be estrogen receptor 100% positive, progesterone receptor negative, with an MIB-1 of 12%, and no HER-2 amplification ([id number]).  Bilateral breast MRI was obtained 05/10/2012, and confirmed the 3 right breast masses in question, measuring 2.1, 2.8 (at the 7:00 position) and 2.2 cm. In addition a 1.3 cm right axillary lymph node was noted. There were no suspicious findings in the left breast. On October 30 fine-needle aspiration of the suspicious right axilla lymph node was performed, with the results being negative ([id number]).   Finally on 06/07/2012 the patient underwent right modified radical mastectomy. The three  masses in the right breast measured 2.2 cm, 3 cm, and 2.3 cm ([id number]). The 2.3 cm mass was morphologically slightly different (grade 2) and a prognostic profile was obtained from this mass ("superior mid-medial"). It was 100% estrogen and 98% progesterone receptor positive. MIB-1 was 16%. There was no HER-2 amplification. A total of 19 lymph nodes were sampled, of which 9 were positive. Margins were negative, but close (1 mm). The patient's subsequent history is as detailed below.  INTERVAL HISTORY: Kelly [name] was seen with her husband Kelly [name] 06/18/2012 at the breast clinic to review her pathology and discuss overall treatment plans.  REVIEW OF SYSTEMS: She had significant urinary retention postoperatively, requiring a Foley; she also has significant constipation. There was no fever or bleeding unusual swelling or dehiscence. She has been losing weight for the past year, from about 135 pounds to her current 119 pounds. Kelly [name] feels her diet is inadequate, and this was discussed today. She has insomnia, partly due to her sleep apnea. Kelly [name] raised concerns regarding the use of laxatives and the use of nasal sprays. In addition she has pain in both legs below the knees, not necessarily involving the feet, which is not like "electrical shocks", or stabbing, or like an ache. These have been present at least several months. It does not limit her activities. A detailed review of systems was otherwise noncontributory  PAST MEDICAL HISTORY: Past Medical History  Diagnosis Date  . Anxiety   . Depression   . Breast cancer 05/05/12    right, ER +, PR -, Her 2 -   sleep apnea; migraines;  PAST SURGICAL HISTORY:  Past Surgical History  Procedure Date  . Cesarean section     x3  . Joint replacement     Rt and Lt thumbs  . Cervical conization w/bx   . Breast surgery     several biopsies, needle aspiration  . Mastectomy w/ sentinel node biopsy 06/07/2012    Procedure: MASTECTOMY WITH SENTINEL LYMPH NODE BIOPSY;   Surgeon: Currie Paris, MD;  Location: [facility] OR;  Service: General;  Laterality: Right;  right total mastectomy and sentinel node  . Mastectomy modified radical 06/07/2012    Procedure: MASTECTOMY MODIFIED RADICAL;  Surgeon: Currie Paris, MD;  Location: [facility] OR;  Service: General;  Laterality: Right;    FAMILY HISTORY Family History  Problem Relation Age of Onset  . Cancer Mother     breast, non-hodgkins lymphoma  . Stroke Maternal Grandfather   . Bipolar disorder Brother   . Gout Brother    the patient's mother, Kelly [name], is also my patient and has a history of breast cancer. The patient's father was adopted. Event has 2 brothers, no sisters. There is no history of ovarian cancer in the family.  GYNECOLOGIC HISTORY: She had menarche age 16, first live birth age 89. She is GX P3. Her periods are regular.  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 Her husband Kelly [name]                         . Their children are currently 40 years old (doing the [place] trail), 69 (freshman at [employer]), and 11.   ADVANCED DIRECTIVES:  HEALTH MAINTENANCE: History  Substance Use Topics  . Smoking status: Never Smoker   . Smokeless tobacco: Never Used  . Alcohol Use: Yes     Comment: very rare     Colonoscopy:  PAP:  Bone density:  Lipid panel:  No Known Allergies  Current Outpatient Prescriptions  Medication Sig Dispense Refill  . buPROPion (WELLBUTRIN XL) 150 MG 24 hr tablet Take 150 mg by mouth daily.       . Naproxen Sodium (ALEVE) 220 MG CAPS Take 1 capsule by mouth as needed.      . polyethylene glycol powder (MIRALAX) powder Take 17 g by mouth daily.  255 g  0  . traZODone (DESYREL) 50 MG tablet Take 150 mg by mouth at bedtime. scheduled      . VIIBRYD 40 MG TABS Take 40 mg by mouth daily.         OBJECTIVE: Middle-aged white woman who appears fatigued Filed Vitals:   06/18/12 1622    BP: 85/54  Pulse: 67  Temp: 98.4 F (36.9 C)  Resp: 20     Body mass index is 20.25 kg/(m^2).    ECOG FS: 1  Sclerae unicteric Oropharynx clear No cervical or supraclavicular adenopathy Lungs no rales or rhonchi Heart regular rate and rhythm Abd benign MSK no focal spinal tenderness, no peripheral edema Neuro: nonfocal Breasts: The right breast is status post mastectomy. Incision is healing very nicely. There is no erythema, dehiscence, or unusual swelling. The left breast is unremarkable   LAB RESULTS: Lab Results  Component Value Date   WBC 7.0 06/18/2012   NEUTROABS 5.0 06/18/2012   HGB 11.6 06/18/2012   HCT 33.0* 06/18/2012   MCV 91.8 06/18/2012   PLT 309 06/18/2012      Chemistry      Component Value Date/Time   NA  142 06/18/2012 1548   NA 137 03/01/2011 0315   K 3.8 06/18/2012 1548   K 4.1 03/01/2011 0315   CL 105 06/18/2012 1548   CL 106 03/01/2011 0315   CO2 33* 06/18/2012 1548   CO2 26 03/01/2011 0315   BUN 18.0 06/18/2012 1548   BUN 8 03/01/2011 0315   CREATININE 0.9 06/18/2012 1548   CREATININE 0.60 03/01/2011 0315      Component Value Date/Time   CALCIUM 9.5 06/18/2012 1548   CALCIUM 8.5 03/01/2011 0315   ALKPHOS 78 06/18/2012 1548   ALKPHOS 63 02/28/2011 0540   AST 17 06/18/2012 1548   AST 28 02/28/2011 0540   ALT 15 06/18/2012 1548   ALT 19 02/28/2011 0540   BILITOT 0.34 06/18/2012 1548   BILITOT 1.3* 02/28/2011 0540       Lab Results  Component Value Date   LABCA2 31 06/18/2012    No components found with this basename: [id number]    No results found for this basename: INR:1;PROTIME:1 in the last 168 hours  Urinalysis    Component Value Date/Time   COLORURINE YELLOW 06/09/2012 0509   APPEARANCEUR CLEAR 06/09/2012 0509   LABSPEC 1.012 06/09/2012 0509   PHURINE 7.5 06/09/2012 0509   GLUCOSEU NEGATIVE 06/09/2012 0509   HGBUR NEGATIVE 06/09/2012 0509   BILIRUBINUR NEGATIVE 06/09/2012 0509   KETONESUR NEGATIVE 06/09/2012 0509   PROTEINUR NEGATIVE  06/09/2012 0509   UROBILINOGEN 0.2 06/09/2012 0509   NITRITE NEGATIVE 06/09/2012 0509   LEUKOCYTESUR NEGATIVE 06/09/2012 0509    STUDIES: PET [facility] 05/14/2012 showed uptake only in the right breast area (SUV. of 4.1 and 2.9). In addition, a left pelvis area of uptake, poorly characterized, had an SUV of 3.8. This was felt likely to represent a loop of unopacified small bowel.  ASSESSMENT: 51 y.o. [place] woman status post right modified radical mastectomy 06/07/2012 for a multifocal invasive ductal carcinoma, the largest of 3 masses measuring 3.0 cm, grade 1; a second mass measuring 2.3 cm, grade 2; and a third mass measuring 2.2 cm, grade 1. The 2 larger masses were both 100% estrogen receptor positive, the grade 2 mass being also progesterone receptor positive at 98%, with an MIB-1 of 16% (the larger mass was estrogen receptor negative, with an Mib-1 of 12%). Both showed no HER-2 amplification. 9 of 19 axillary lymph nodes sampled were involved. In summary:  (1) mpT2 pN2a or stage IIIA invasive ductal carcinoma, grade 1-2, estrogen receptor positive, HER-2 not amplified, with an MIB-1 of 12-16%  (2) the patient opted to forego adjuvant chemotherapy  (3) adjuvant radiation therapy is planned to start mid February  (4) antiestrogen therapy for 5-10 years is scheduled to follow radiation therapy.  (5) genetic testing for BRCA is pending   PLAN: We spent the better part of the more than one hour visit today discussing the biology of breast cancer in general as well as her on specific situation. I quoted her a risk of recurrence of 52%, which she could decrease by 24% with optimal adjuvant antiestrogen therapy. This would leave her a residual 28% risk of recurrence, which she could cut an additional 10% with standard chemotherapy. She has a good understanding of the possible toxicities side effects and complications of chemotherapy. After much  discussion she decided to forego this option, accepting a 72% chance of having no disease recurrence within 10 years with adjuvant antiestrogen alone.  Accordingly she will proceed with adjuvant radiation, and is already  scheduled for simulation December 10. She will see me late January, and today I gave her written information on tamoxifen, which we will start as soon as her radiation is completed. I am also requesting an appointment with our genetics counselor. We discussed issues relating to diet and to her lower extremity discomfort, which I assured does not her percent metastatic disease. I scheduled a bone density prior to her late January visit. She knows to call for any problems that may develop before then. Deleon Passe C    06/19/2012

## 2012-06-21 ENCOUNTER — Telehealth: Payer: Self-pay | Admitting: Oncology

## 2012-06-21 NOTE — Telephone Encounter (Signed)
S/w the pt regarding her appts for jan. The pt will do her bone density in jan 2014 and see the genetic counselor.

## 2012-06-23 ENCOUNTER — Encounter: Payer: Self-pay | Admitting: *Deleted

## 2012-06-23 ENCOUNTER — Other Ambulatory Visit: Payer: Self-pay | Admitting: Oncology

## 2012-06-23 DIAGNOSIS — C50919 Malignant neoplasm of unspecified site of unspecified female breast: Secondary | ICD-10-CM

## 2012-06-23 NOTE — Progress Notes (Signed)
Mailed after appt letter to pt. 

## 2012-06-28 ENCOUNTER — Telehealth (INDEPENDENT_AMBULATORY_CARE_PROVIDER_SITE_OTHER): Payer: Self-pay | Admitting: General Surgery

## 2012-06-28 NOTE — Telephone Encounter (Signed)
Pt called to request Rx be FAXd for her to attend the ABC class this morning.  Rx signed by Dr. Dwain Sarna (for Dr. Jamey Ripa) and forwarded.

## 2012-06-30 ENCOUNTER — Ambulatory Visit (INDEPENDENT_AMBULATORY_CARE_PROVIDER_SITE_OTHER): Payer: Managed Care, Other (non HMO) | Admitting: Surgery

## 2012-06-30 ENCOUNTER — Encounter (INDEPENDENT_AMBULATORY_CARE_PROVIDER_SITE_OTHER): Payer: Self-pay | Admitting: Surgery

## 2012-06-30 VITALS — BP 94/62 | HR 64 | Temp 98.2°F | Resp 16 | Ht 64.5 in | Wt 119.0 lb

## 2012-06-30 DIAGNOSIS — Z09 Encounter for follow-up examination after completed treatment for conditions other than malignant neoplasm: Secondary | ICD-10-CM

## 2012-06-30 NOTE — Patient Instructions (Signed)
Get fitted for a lymphedema sleeve Come back to see me in about 2 months, after you finished radiation therapy

## 2012-06-30 NOTE — Progress Notes (Signed)
Kelly [name]    [id number] 06/30/2012    06-13-1961   CC: Post op Right MRM  HPI: The patient returns for post op follow-up. She underwent a Right MRM  on 06/07/2012. Over all she feels that she is doing well. Has been to the abc class and will get fitted for lymphedema sleeve  PE: VITAL SIGNS: BP 94/62  Pulse 64  Temp 98.2 F (36.8 C) (Temporal)  Resp 16  Ht 5' 4.5" (1.638 m)  Wt 119 lb (53.978 kg)  BMI 20.11 kg/m2  LMP 06/10/2012  The incision is healing nicely and there is no evidence of infection or hematoma.  No seroma, s/p removal of drain   IMPRESSION: Patient doing well.   PLAN: Will see after radiation therapy

## 2012-07-06 ENCOUNTER — Other Ambulatory Visit: Payer: Self-pay | Admitting: Radiation Oncology

## 2012-07-06 ENCOUNTER — Ambulatory Visit: Payer: Managed Care, Other (non HMO) | Admitting: Radiation Oncology

## 2012-07-07 ENCOUNTER — Ambulatory Visit
Admission: RE | Admit: 2012-07-07 | Discharge: 2012-07-07 | Disposition: A | Discharge status: Home / Self Care | Payer: Managed Care, Other (non HMO) | Source: Ambulatory Visit | Attending: Radiation Oncology | Admitting: Radiation Oncology

## 2012-07-07 DIAGNOSIS — R5383 Other fatigue: Secondary | ICD-10-CM | POA: Insufficient documentation

## 2012-07-07 DIAGNOSIS — C50919 Malignant neoplasm of unspecified site of unspecified female breast: Secondary | ICD-10-CM | POA: Insufficient documentation

## 2012-07-07 DIAGNOSIS — F411 Generalized anxiety disorder: Secondary | ICD-10-CM | POA: Insufficient documentation

## 2012-07-07 DIAGNOSIS — Z79899 Other long term (current) drug therapy: Secondary | ICD-10-CM | POA: Insufficient documentation

## 2012-07-07 DIAGNOSIS — L988 Other specified disorders of the skin and subcutaneous tissue: Secondary | ICD-10-CM | POA: Insufficient documentation

## 2012-07-07 DIAGNOSIS — R5381 Other malaise: Secondary | ICD-10-CM | POA: Insufficient documentation

## 2012-07-07 DIAGNOSIS — Z510 Encounter for antineoplastic radiation therapy: Secondary | ICD-10-CM | POA: Insufficient documentation

## 2012-07-07 DIAGNOSIS — R110 Nausea: Secondary | ICD-10-CM | POA: Insufficient documentation

## 2012-07-07 DIAGNOSIS — C50911 Malignant neoplasm of unspecified site of right female breast: Secondary | ICD-10-CM

## 2012-07-08 NOTE — Progress Notes (Signed)
  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   NARRATIVE:  The patient was brought to the CT Simulation planning suite.  Identity was confirmed.  All relevant records and images related to the planned course of therapy were reviewed.  The patient freely provided informed written consent to proceed with treatment after reviewing the details related to the planned course of therapy. The consent form was witnessed and verified by the simulation staff.  Then, the patient was set-up in a stable reproducible  supine position for radiation therapy.  CT images were obtained.  Surface markings were placed.  The CT images were loaded into the planning software.  Then the target and avoidance structures were contoured.  Treatment planning then occurred.  The radiation prescription was entered and confirmed.  A total of 5 complex treatment devices were fabricated. I have requested : Isodose Plan.  I have ordered:dose calc.  PLAN:  The patient will receive 59.4 Gy in 33 fractions.   ________________________________   Billie Lade, PhD, MD

## 2012-07-09 ENCOUNTER — Other Ambulatory Visit: Payer: Self-pay | Admitting: Oncology

## 2012-07-13 ENCOUNTER — Ambulatory Visit
Admission: RE | Admit: 2012-07-13 | Discharge: 2012-07-13 | Disposition: A | Discharge status: Home / Self Care | Payer: Managed Care, Other (non HMO) | Source: Ambulatory Visit | Attending: Radiation Oncology | Admitting: Radiation Oncology

## 2012-07-13 DIAGNOSIS — C50911 Malignant neoplasm of unspecified site of right female breast: Secondary | ICD-10-CM

## 2012-07-13 NOTE — Progress Notes (Signed)
  Radiation Oncology         [phone] ________________________________  Name: Kelly [name] MRN: [id number]  Date: 07/13/2012  DOB: April 16, 1961  Simulation Verification Note  Status: outpatient  NARRATIVE: The patient was brought to the treatment unit and placed in the planned treatment position. The clinical setup was verified. Then port films were obtained and uploaded to the radiation oncology medical record software.  The treatment beams were carefully compared against the planned radiation fields. The position location and shape of the radiation fields was reviewed. They targeted volume of tissue appears to be appropriately covered by the radiation beams. Organs at risk appear to be excluded as planned.  Based on my personal review, I approved the simulation verification. The patient's treatment will proceed as planned.  -----------------------------------  Billie Lade, PhD, MD

## 2012-07-14 ENCOUNTER — Encounter (INDEPENDENT_AMBULATORY_CARE_PROVIDER_SITE_OTHER): Payer: Self-pay

## 2012-07-14 ENCOUNTER — Ambulatory Visit
Admission: RE | Admit: 2012-07-14 | Discharge: 2012-07-14 | Disposition: A | Discharge status: Home / Self Care | Payer: Managed Care, Other (non HMO) | Source: Ambulatory Visit | Attending: Radiation Oncology | Admitting: Radiation Oncology

## 2012-07-15 ENCOUNTER — Ambulatory Visit
Admission: RE | Admit: 2012-07-15 | Discharge: 2012-07-15 | Disposition: A | Discharge status: Home / Self Care | Payer: Managed Care, Other (non HMO) | Source: Ambulatory Visit | Attending: Radiation Oncology | Admitting: Radiation Oncology

## 2012-07-16 ENCOUNTER — Ambulatory Visit
Admission: RE | Admit: 2012-07-16 | Discharge: 2012-07-16 | Disposition: A | Discharge status: Home / Self Care | Payer: Managed Care, Other (non HMO) | Source: Ambulatory Visit | Attending: Radiation Oncology | Admitting: Radiation Oncology

## 2012-07-19 ENCOUNTER — Ambulatory Visit
Admission: RE | Admit: 2012-07-19 | Discharge: 2012-07-19 | Disposition: A | Discharge status: Home / Self Care | Payer: Managed Care, Other (non HMO) | Source: Ambulatory Visit | Attending: Radiation Oncology | Admitting: Radiation Oncology

## 2012-07-20 ENCOUNTER — Ambulatory Visit
Admission: RE | Admit: 2012-07-20 | Discharge: 2012-07-20 | Disposition: A | Discharge status: Home / Self Care | Payer: Managed Care, Other (non HMO) | Source: Ambulatory Visit | Attending: Radiation Oncology | Admitting: Radiation Oncology

## 2012-07-20 ENCOUNTER — Encounter: Payer: Self-pay | Admitting: Radiation Oncology

## 2012-07-20 VITALS — BP 84/41 | HR 70 | Temp 97.6°F | Resp 20 | Wt 115.9 lb

## 2012-07-20 DIAGNOSIS — C50911 Malignant neoplasm of unspecified site of right female breast: Secondary | ICD-10-CM

## 2012-07-20 MED ORDER — RADIAPLEXRX EX GEL
Freq: Once | CUTANEOUS | Status: AC
  Administered 2012-07-20: 09:00:00 via TOPICAL

## 2012-07-20 MED ORDER — ALRA NON-METALLIC DEODORANT (RAD-ONC)
1.0000 "application " | Freq: Once | TOPICAL | Status: AC
  Administered 2012-07-20: 1 via TOPICAL

## 2012-07-20 NOTE — Progress Notes (Signed)
Post sim education ,radiaplex gel ,alra given with instructions on use of products Here weekly rad txs, has completed 5 right chest wall area, c/o tightness and soreness Scabbed area on incision site, vitals= 97.6,b/p=81/41,p=70,rr=20, patient states she always has low b/p, just finished z-pack antibiotics for bronchitis 8:41 AM

## 2012-07-20 NOTE — Progress Notes (Signed)
[facility]    Radiation Oncology [address] [place]     Maryln Gottron, M.D. [place], [place] [address]               Billie Lade, M.D., Ph.D. Phone: [phone]      Molli Hazard A. Kathrynn Running, M.D. Fax: [phone]      Radene Gunning, M.D., Ph.D.         Lurline Hare, M.D.         Grayland Jack, M.D Weekly Treatment Management Note  Name: Kelly [name]     MRN: [id number]        CSN: [id number] Date: 07/20/2012      DOB: 1961/02/17  CC: Annamaria Boots, MD         Hyacinth Meeker    Status: Outpatient  Diagnosis: The encounter diagnosis was Right breast cancer, IDC, multicentric.  Current Dose: 9.0 Gy   Current Fraction: 5  Planned Dose: 59.4 Gy  Narrative: [name] was seen today for weekly treatment management. The chart was checked and port films  were reviewed. She is tolerating the treatments well at this time without any side effects.  Review of patient's allergies indicates no known allergies.  Current Outpatient Prescriptions  Medication Sig Dispense Refill  . Docusate Calcium (STOOL SOFTENER PO) Take by mouth daily.      . hyaluronate sodium (RADIAPLEXRX) GEL Apply 1 application topically 2 (two) times daily. Apply to skin after rad tx and bedtime      . Naproxen Sodium (ALEVE) 220 MG CAPS Take 1 capsule by mouth as needed.      . non-metallic deodorant Thornton Papas) MISC Apply 1 application topically daily. Apply after rad tx daily and prn      . polyethylene glycol powder (MIRALAX) powder Take 17 g by mouth daily.  255 g  0  . traZODone (DESYREL) 50 MG tablet Take 150 mg by mouth at bedtime. scheduled      . VIIBRYD 40 MG TABS Take 40 mg by mouth daily.       Marland Kitchen buPROPion (WELLBUTRIN XL) 150 MG 24 hr tablet Take 150 mg by mouth daily.        Labs:  Lab Results  Component Value Date   WBC 7.0 06/18/2012   HGB 11.6 06/18/2012   HCT 33.0* 06/18/2012   MCV 91.8 06/18/2012   PLT 309 06/18/2012   Lab Results  Component Value Date   CREATININE 0.9 06/18/2012   BUN 18.0 06/18/2012   NA 142 06/18/2012   K 3.8 06/18/2012   CL 105 06/18/2012   CO2 33* 06/18/2012   Lab Results  Component Value Date   ALT 15 06/18/2012   AST 17 06/18/2012   PHOS 3.3 02/28/2011   BILITOT 0.34 06/18/2012    Physical Examination:  weight is 115 lb 14.4 oz (52.572 kg). Her oral temperature is 97.6 F (36.4 C). Her blood pressure is 84/41 and her pulse is 70. Her respiration is 20.    Wt Readings from Last 3 Encounters:  07/20/12 115 lb 14.4 oz (52.572 kg)  06/30/12 119 lb (53.978 kg)  06/18/12 118 lb (53.524 kg)    The right mastectomy scar is well-healed without signs of drainage or infection.   there is no significant radiation reaction noted at this time. Lungs - Normal respiratory effort, chest expands symmetrically. Lungs are clear to auscultation, no crackles or wheezes.  Heart has regular rhythm and rate  Abdomen is soft and  non tender with normal bowel sounds  Assessment:  Patient tolerating treatments well  Plan: Continue treatment per original radiation prescription

## 2012-07-22 ENCOUNTER — Ambulatory Visit
Admission: RE | Admit: 2012-07-22 | Discharge: 2012-07-22 | Disposition: A | Discharge status: Home / Self Care | Payer: Managed Care, Other (non HMO) | Source: Ambulatory Visit | Attending: Radiation Oncology | Admitting: Radiation Oncology

## 2012-07-23 ENCOUNTER — Ambulatory Visit
Admission: RE | Admit: 2012-07-23 | Discharge: 2012-07-23 | Disposition: A | Discharge status: Home / Self Care | Payer: Managed Care, Other (non HMO) | Source: Ambulatory Visit | Attending: Radiation Oncology | Admitting: Radiation Oncology

## 2012-07-26 ENCOUNTER — Ambulatory Visit
Admission: RE | Admit: 2012-07-26 | Discharge: 2012-07-26 | Disposition: A | Discharge status: Home / Self Care | Payer: Managed Care, Other (non HMO) | Source: Ambulatory Visit | Attending: Radiation Oncology | Admitting: Radiation Oncology

## 2012-07-27 ENCOUNTER — Ambulatory Visit
Admission: RE | Admit: 2012-07-27 | Discharge: 2012-07-27 | Disposition: A | Discharge status: Home / Self Care | Payer: Managed Care, Other (non HMO) | Source: Ambulatory Visit | Attending: Radiation Oncology | Admitting: Radiation Oncology

## 2012-07-27 DIAGNOSIS — C50911 Malignant neoplasm of unspecified site of right female breast: Secondary | ICD-10-CM

## 2012-07-27 NOTE — Progress Notes (Signed)
Patient here for routine weekly assessment of right breast cancer.Has completed 9 treatments thus far.Denies pain but does have significant fatigue but has a lot of family issues right now.Skin is red but without peeling/

## 2012-07-27 NOTE — Progress Notes (Signed)
Weekly Management Note Current Dose: 16.2  Gy  Projected Dose: 60.4 Gy   Narrative:  The patient presents for routine under treatment assessment.  CBCT/MVCT images/Port film x-rays were reviewed.  The chart was checked. Tired. Mother in hospital with pneumonia. No pain.   Physical Findings: Weight:  . Slightly pink skin over right chest wall.  Impression:  The patient is tolerating radiation.  Plan:  Continue treatment as planned. Continue radiaplex.

## 2012-07-29 ENCOUNTER — Ambulatory Visit
Admission: RE | Admit: 2012-07-29 | Discharge: 2012-07-29 | Disposition: A | Discharge status: Home / Self Care | Payer: Managed Care, Other (non HMO) | Source: Ambulatory Visit | Attending: Radiation Oncology | Admitting: Radiation Oncology

## 2012-07-30 ENCOUNTER — Ambulatory Visit
Admission: RE | Admit: 2012-07-30 | Discharge: 2012-07-30 | Disposition: A | Discharge status: Home / Self Care | Payer: Managed Care, Other (non HMO) | Source: Ambulatory Visit | Attending: Radiation Oncology | Admitting: Radiation Oncology

## 2012-08-02 ENCOUNTER — Ambulatory Visit
Admission: RE | Admit: 2012-08-02 | Discharge: 2012-08-02 | Disposition: A | Discharge status: Home / Self Care | Payer: Managed Care, Other (non HMO) | Source: Ambulatory Visit | Attending: Radiation Oncology | Admitting: Radiation Oncology

## 2012-08-03 ENCOUNTER — Ambulatory Visit
Admission: RE | Admit: 2012-08-03 | Discharge: 2012-08-03 | Disposition: A | Discharge status: Home / Self Care | Payer: Managed Care, Other (non HMO) | Source: Ambulatory Visit | Attending: Radiation Oncology | Admitting: Radiation Oncology

## 2012-08-03 ENCOUNTER — Encounter: Payer: Self-pay | Admitting: Radiation Oncology

## 2012-08-03 ENCOUNTER — Encounter: Payer: Self-pay | Admitting: *Deleted

## 2012-08-03 ENCOUNTER — Encounter: Payer: Self-pay | Admitting: Specialist

## 2012-08-03 VITALS — BP 104/57 | HR 65 | Temp 98.2°F | Resp 16 | Wt 116.8 lb

## 2012-08-03 DIAGNOSIS — C50911 Malignant neoplasm of unspecified site of right female breast: Secondary | ICD-10-CM

## 2012-08-03 MED ORDER — LORAZEPAM 0.5 MG PO TABS
0.5000 mg | ORAL_TABLET | Freq: Once | ORAL | Status: AC
  Administered 2012-08-03: 0.5 mg via SUBLINGUAL
  Filled 2012-08-03: qty 1

## 2012-08-03 MED ORDER — LORAZEPAM 0.5 MG PO TABS: 0.5000 mg | ORAL_TABLET | Freq: Three times a day (TID) | ORAL | Status: DC

## 2012-08-03 MED ORDER — PROCHLORPERAZINE MALEATE 10 MG PO TABS: 10.0000 mg | ORAL_TABLET | Freq: Four times a day (QID) | ORAL | Status: DC | PRN

## 2012-08-03 NOTE — Progress Notes (Signed)
[facility] Brief Psychosocial Assessment Clinical Social Work  Clinical Social Work was referred by Charity fundraiser for brief psychosocial support.  CSW met with patient and patient's neighbor in radonc exam room.  Patient states she is experiencing dizziness, nausea, and anxiety.  She states she has three children ages 51, 25, and 61.  She reports that her two eldest children are coping adequately and providing support, however, her youngest son is having more difficulty adjusting to patient's illness.  She reports she is experiencing communication issues with her spouse and feels they both would benefit from individual counseling and couple counseling.  CSW discussed counseling resources as well as [facility] for patient's son.  CSW will meet with patient tomorrow before radiation treatment to provide resources.  Kathrin Penner, MSW, LCSW Clinical Social Worker [facility] [phone]

## 2012-08-03 NOTE — Progress Notes (Signed)
[facility]    Radiation Oncology [address] [place]     Kelly [name], M.D. [place], [place] [address]               Kelly [name], M.D., Ph.D. Phone: [phone]      Kelly [name], M.D. Fax: [phone]      Kelly [name], M.D., Ph.D.         Kelly [name], M.D.         Kelly [name], M.D Weekly Treatment Management Note  Name: Kelly [name]     MRN: [id number]        CSN: [id number] Date: 08/03/2012      DOB: 1961/07/13  CC: Kelly Boots, MD         Kelly [name]    Status: Outpatient  Diagnosis: The encounter diagnosis was Right breast cancer, IDC, multicentric.  Current Dose: 23.4 Gy  Current Fraction: 13  Planned Dose: 59.4 Gy  Narrative: Kelly [name] was seen today for weekly treatment management. The chart was checked and port films  were reviewed. She is having a significant amount of fatigue at this time. She has had a lot of emotional stress and family stress recently. She did meet with the chaplain today. She is also planning to meet with the psychologist. She has had some nausea and anxiety as above. Patient would like to try something for nausea and I thought Ativan may be a good option to come cover both of her issues.  She also wished to have Compazine.  Patient will be scheduled for routine blood work tomorrow at may possibly explain some of her fatigue issues.  Review of patient's allergies indicates no known allergies.  Current Outpatient Prescriptions  Medication Sig Dispense Refill  . buPROPion (WELLBUTRIN XL) 150 MG 24 hr tablet Take 150 mg by mouth daily.       Kelly [name] Calcium (STOOL SOFTENER PO) Take by mouth daily.      . hyaluronate sodium (RADIAPLEXRX) GEL Apply 1 application topically 2 (two) times daily. Apply to skin after rad tx and bedtime      . non-metallic deodorant (ALRA) MISC Apply 1 application topically daily. Apply after rad tx daily and prn      . polyethylene glycol powder (MIRALAX) powder Take 17 g  by mouth daily.  255 g  0  . traZODone (DESYREL) 50 MG tablet Take 150 mg by mouth at bedtime. scheduled      . VIIBRYD 40 MG TABS Take 40 mg by mouth daily.       Marland Kitchen LORazepam (ATIVAN) 0.5 MG tablet Take 1 tablet (0.5 mg total) by mouth every 8 (eight) hours.  30 tablet  0  . prochlorperazine (COMPAZINE) 10 MG tablet Take 1 tablet (10 mg total) by mouth every 6 (six) hours as needed.  30 tablet  0   Labs:  Lab Results  Component Value Date   WBC 7.0 06/18/2012   HGB 11.6 06/18/2012   HCT 33.0* 06/18/2012   MCV 91.8 06/18/2012   PLT 309 06/18/2012   Lab Results  Component Value Date   CREATININE 0.9 06/18/2012   BUN 18.0 06/18/2012   NA 142 06/18/2012   K 3.8 06/18/2012   CL 105 06/18/2012   CO2 33* 06/18/2012   Lab Results  Component Value Date   ALT 15 06/18/2012   AST 17 06/18/2012   PHOS 3.3 02/28/2011   BILITOT 0.34 06/18/2012    Physical Examination:  weight is 116 lb 12.8 oz (52.98 kg). Her oral temperature is 98.2 F (36.8 C). Her blood pressure is 97/67 and her pulse is 70. Her respiration is 20.    Wt Readings from Last 3 Encounters:  08/03/12 116 lb 12.8 oz (52.98 kg)  07/20/12 115 lb 14.4 oz (52.572 kg)  06/30/12 119 lb (53.978 kg)    The right chest wall axillary and supraclavicular region shows some hyperpigmentation changes and erythema. There appears to be some scar tissue or possibly a small seroma along the lateral aspect of her mastectomy scar. Lungs - Normal respiratory effort, chest expands symmetrically. Lungs are clear to auscultation, no crackles or wheezes.  Heart has regular rhythm and rate  Abdomen is soft and non tender with normal bowel sounds  Assessment:  Patient tolerating treatments well except for issues as above  Plan: Continue treatment per original radiation prescription

## 2012-08-03 NOTE — Progress Notes (Signed)
Patient here for weekly radiation treatments right breast area/chest wall=13/33 completed so far Patient has erythema on breast area and under axilla,skin intact, c/o tightness  Under arm, patient very fatigued,  And poosr appetite, only has had "OJ, figs and nuts today", asked why no appetitive, patient is caring for her mother who has double pneumonia, friend stated she has to take care of everybody , and is drained, needs help, has 2 3 sons age 51,18 and 5, patient saw Chaplain today , and asked if I called Laurenn Sabino Gasser our social worker would she be available to discuss her stress, "yes" patient tearful and exhausted,"saying she doesn't get much support from her husband,"Ithat's part of the problem," there are programs for Children that Lauren could help with her son/s, patient said yes to have Lauren give her a call, , voice mail left on Laurens's phone 2:57 PM

## 2012-08-03 NOTE — Progress Notes (Signed)
I saw the patient in radiation oncology after one of the staff there called to ask if I would come downstairs.  They reported that the patient had been tearful and wanted to talk to someone.  The patient was accompanied by a good friend.  She described the events of the last month and a half, including her diagnosis, surgery, treatment, and also being responsible for her mother who was hospitalized with pneumonia.  She said that she feels she and her husband need marriage counseling and that her husband had told her that she is only concerned with her needs.  She was tearful as she related this information.  I listened, encouraged her to take steps to take care of herself.  She said she is interested in individual counseling for herself and perhaps marriage counseling for herself and her husband.  I told her I would call her with some recommendations for counselors.

## 2012-08-04 ENCOUNTER — Ambulatory Visit
Admission: RE | Admit: 2012-08-04 | Discharge: 2012-08-04 | Disposition: A | Discharge status: Home / Self Care | Payer: Managed Care, Other (non HMO) | Source: Ambulatory Visit | Attending: Radiation Oncology | Admitting: Radiation Oncology

## 2012-08-04 ENCOUNTER — Ambulatory Visit: Payer: BC Managed Care – PPO | Attending: Radiation Oncology

## 2012-08-04 NOTE — Addendum Note (Signed)
Encounter addended by: Agnes Lawrence, RN on: 08/04/2012 10:03 AM<BR>     Documentation filed: Notes Section, Vitals Section

## 2012-08-04 NOTE — Progress Notes (Signed)
Late entry from 08/03/2012 at 1625. Patient resting comfortably on exam table with neighbor at her side. Patient alert and oriented to person, place, and time. No distress noted. Pleasant affect noted. Patient denies pain at this time. Administered Ativan 0.5 mg SL at 1555 as ordered by Dr. Roselind Messier. Now at 1625 patient reports that her nausea has resolved and she is feeling "a little better." Vitals stable. Patient denies an further needs. Assisted patient into wheelchair. Discharged patient home with neighbor.

## 2012-08-05 ENCOUNTER — Encounter: Payer: Self-pay | Admitting: Specialist

## 2012-08-05 ENCOUNTER — Ambulatory Visit
Admission: RE | Admit: 2012-08-05 | Discharge: 2012-08-05 | Disposition: A | Discharge status: Home / Self Care | Payer: Managed Care, Other (non HMO) | Source: Ambulatory Visit | Attending: Radiation Oncology | Admitting: Radiation Oncology

## 2012-08-05 ENCOUNTER — Ambulatory Visit
Admission: RE | Admit: 2012-08-05 | Discharge: 2012-08-05 | Disposition: A | Discharge status: Home / Self Care | Payer: BC Managed Care – PPO | Source: Ambulatory Visit | Attending: Radiation Oncology | Admitting: Radiation Oncology

## 2012-08-05 DIAGNOSIS — R5381 Other malaise: Secondary | ICD-10-CM | POA: Insufficient documentation

## 2012-08-05 DIAGNOSIS — C50919 Malignant neoplasm of unspecified site of unspecified female breast: Secondary | ICD-10-CM | POA: Insufficient documentation

## 2012-08-05 DIAGNOSIS — Z79899 Other long term (current) drug therapy: Secondary | ICD-10-CM | POA: Insufficient documentation

## 2012-08-05 DIAGNOSIS — R110 Nausea: Secondary | ICD-10-CM | POA: Insufficient documentation

## 2012-08-05 DIAGNOSIS — R5383 Other fatigue: Secondary | ICD-10-CM | POA: Insufficient documentation

## 2012-08-05 DIAGNOSIS — C50911 Malignant neoplasm of unspecified site of right female breast: Secondary | ICD-10-CM

## 2012-08-05 LAB — COMPREHENSIVE METABOLIC PANEL (CC13)
ALT: 9 U/L (ref 0–55)
AST: 16 U/L (ref 5–34)
Albumin: 3.1 g/dL — ABNORMAL LOW (ref 3.5–5.0)
Alkaline Phosphatase: 74 U/L (ref 40–150)
BUN: 15 mg/dL (ref 7.0–26.0)
CO2: 30 mEq/L — ABNORMAL HIGH (ref 22–29)
Calcium: 8.8 mg/dL (ref 8.4–10.4)
Chloride: 102 mEq/L (ref 98–107)
Creatinine: 0.8 mg/dL (ref 0.6–1.1)
Glucose: 100 mg/dl — ABNORMAL HIGH (ref 70–99)
Potassium: 4.3 mEq/L (ref 3.5–5.1)
Sodium: 142 mEq/L (ref 136–145)
Total Bilirubin: 0.43 mg/dL (ref 0.20–1.20)
Total Protein: 6.2 g/dL — ABNORMAL LOW (ref 6.4–8.3)

## 2012-08-05 LAB — CBC WITH DIFFERENTIAL/PLATELET
BASO%: 0.1 % (ref 0.0–2.0)
Basophils Absolute: 0 10*3/uL (ref 0.0–0.1)
EOS%: 2.6 % (ref 0.0–7.0)
Eosinophils Absolute: 0.2 10*3/uL (ref 0.0–0.5)
HCT: 37.6 % (ref 34.8–46.6)
HGB: 12.5 g/dL (ref 11.6–15.9)
LYMPH%: 6 % — ABNORMAL LOW (ref 14.0–49.7)
MCH: 30.2 pg (ref 25.1–34.0)
MCHC: 33.2 g/dL (ref 31.5–36.0)
MCV: 90.8 fL (ref 79.5–101.0)
MONO#: 0.5 10*3/uL (ref 0.1–0.9)
MONO%: 6.9 % (ref 0.0–14.0)
NEUT#: 5.9 10*3/uL (ref 1.5–6.5)
NEUT%: 84.4 % — ABNORMAL HIGH (ref 38.4–76.8)
Platelets: 190 10*3/uL (ref 145–400)
RBC: 4.14 10*6/uL (ref 3.70–5.45)
RDW: 13.1 % (ref 11.2–14.5)
WBC: 7 10*3/uL (ref 3.9–10.3)
lymph#: 0.4 10*3/uL — ABNORMAL LOW (ref 0.9–3.3)
nRBC: 0 % (ref 0–0)

## 2012-08-05 LAB — TSH: TSH: 0.807 u[IU]/mL (ref 0.350–4.500)

## 2012-08-05 NOTE — Progress Notes (Signed)
I called the patient to follow-up on my visit with her earlier this week and to give her information about counseling resources.  I will ask one of the counseling interns to call her next week to schedule an appointment.  I also suggested that her husband call the office of Dr. Caralyn Guile to schedule an appointment there.  Jaelle took down the information and contact number for Dr. Dellia Cloud and said she would give it to her husband.

## 2012-08-06 ENCOUNTER — Ambulatory Visit
Admission: RE | Admit: 2012-08-06 | Discharge: 2012-08-06 | Disposition: A | Discharge status: Home / Self Care | Payer: Managed Care, Other (non HMO) | Source: Ambulatory Visit | Attending: Radiation Oncology | Admitting: Radiation Oncology

## 2012-08-09 ENCOUNTER — Ambulatory Visit
Admission: RE | Admit: 2012-08-09 | Discharge: 2012-08-09 | Disposition: A | Discharge status: Home / Self Care | Payer: Managed Care, Other (non HMO) | Source: Ambulatory Visit | Attending: Radiation Oncology | Admitting: Radiation Oncology

## 2012-08-10 ENCOUNTER — Ambulatory Visit
Admission: RE | Admit: 2012-08-10 | Discharge: 2012-08-10 | Disposition: A | Discharge status: Home / Self Care | Payer: Managed Care, Other (non HMO) | Source: Ambulatory Visit | Attending: Radiation Oncology | Admitting: Radiation Oncology

## 2012-08-10 ENCOUNTER — Telehealth: Payer: Self-pay | Admitting: Oncology

## 2012-08-10 NOTE — Telephone Encounter (Signed)
Pt came by office and did not have documentation on bone density.Kelly [name] and s/w [name] appt was scheduled for Jan 15 @ 9:30am

## 2012-08-11 ENCOUNTER — Ambulatory Visit
Admission: RE | Admit: 2012-08-11 | Discharge: 2012-08-11 | Disposition: A | Discharge status: Home / Self Care | Payer: Managed Care, Other (non HMO) | Source: Ambulatory Visit | Attending: Radiation Oncology | Admitting: Radiation Oncology

## 2012-08-11 VITALS — BP 100/46 | HR 77 | Temp 98.6°F | Wt 118.5 lb

## 2012-08-11 DIAGNOSIS — C50911 Malignant neoplasm of unspecified site of right female breast: Secondary | ICD-10-CM

## 2012-08-11 NOTE — Progress Notes (Signed)
[facility]    Radiation Oncology [address] [place]     Maryln Gottron, M.D. [place], [place] [address]               Billie Lade, M.D., Ph.D. Phone: [phone]      Molli Hazard A. Kathrynn Running, M.D. Fax: [phone]      Radene Gunning, M.D., Ph.D.         Lurline Hare, M.D.         Grayland Jack, M.D Weekly Treatment Management Note  Name: Kelly [name]     MRN: [id number]        CSN: [id number] Date: 08/11/2012      DOB: [birth year]  CC: Annamaria Boots, MD         Hyacinth Meeker    Status: Outpatient  Diagnosis: The encounter diagnosis was Right breast cancer, IDC, multicentric.  Current Dose: 34.2 Gy  Current Fraction: 19  Planned Dose: 59.4 Gy  Narrative: Kelly [name] was seen today for weekly treatment management. The chart was checked and port films  were reviewed.  She seems to be doing better emotionally this week.  She denies any significant itching or discomfort along the right chest area. She continues to use her skin moisturizer.  She continues to have significant fatigue.  Review of patient's allergies indicates no known allergies.  Current Outpatient Prescriptions  Medication Sig Dispense Refill  . buPROPion (WELLBUTRIN XL) 150 MG 24 hr tablet Take 150 mg by mouth daily.       Tery Sanfilippo Calcium (STOOL SOFTENER PO) Take by mouth daily.      . hyaluronate sodium (RADIAPLEXRX) GEL Apply 1 application topically 2 (two) times daily. Apply to skin after rad tx and bedtime      . LORazepam (ATIVAN) 0.5 MG tablet Take 1 tablet (0.5 mg total) by mouth every 8 (eight) hours.  30 tablet  0  . non-metallic deodorant (ALRA) MISC Apply 1 application topically daily. Apply after rad tx daily and prn      . polyethylene glycol powder (MIRALAX) powder Take 17 g by mouth daily.  255 g  0  . prochlorperazine (COMPAZINE) 10 MG tablet Take 1 tablet (10 mg total) by mouth every 6 (six) hours as needed.  30 tablet  0  . traZODone (DESYREL) 50 MG tablet Take 150  mg by mouth at bedtime. scheduled      . VIIBRYD 40 MG TABS Take 40 mg by mouth daily.        Labs:  Lab Results  Component Value Date   WBC 7.0 08/05/2012   HGB 12.5 08/05/2012   HCT 37.6 08/05/2012   MCV 90.8 08/05/2012   PLT 190 08/05/2012   Lab Results  Component Value Date   CREATININE 0.8 08/05/2012   BUN 15.0 08/05/2012   NA 142 08/05/2012   K 4.3 08/05/2012   CL 102 08/05/2012   CO2 30* 08/05/2012   Lab Results  Component Value Date   ALT 9 08/05/2012   AST 16 08/05/2012   PHOS 3.3 02/28/2011   BILITOT 0.43 08/05/2012    Physical Examination:  weight is 118 lb 8 oz (53.751 kg). Her temperature is 98.6 F (37 C). Her blood pressure is 100/46 and her pulse is 77.    Wt Readings from Last 3 Encounters:  08/11/12 118 lb 8 oz (53.751 kg)  08/03/12 116 lb 12.8 oz (52.98 kg)  07/20/12 115 lb 14.4 oz (52.572 kg)  The right chest wall axillary and supraclavicular region shows hyperpigmentation changes and erythema but no skin breakdown. Lungs - Normal respiratory effort, chest expands symmetrically. Lungs are clear to auscultation, no crackles or wheezes.  Heart has regular rhythm and rate  Abdomen is soft and non tender with normal bowel sounds  Assessment:  Patient tolerating treatments well  Plan: Continue treatment per original radiation prescription

## 2012-08-11 NOTE — Progress Notes (Signed)
Patient here for routine weekly under treat visit for right breast radiation.Completed 19 treatments thus far.Skin is hyperpigmented.No peeling.Increased fatigue.Affect improved.

## 2012-08-12 ENCOUNTER — Ambulatory Visit
Admission: RE | Admit: 2012-08-12 | Discharge: 2012-08-12 | Disposition: A | Discharge status: Home / Self Care | Payer: Managed Care, Other (non HMO) | Source: Ambulatory Visit | Attending: Radiation Oncology | Admitting: Radiation Oncology

## 2012-08-12 DIAGNOSIS — C50911 Malignant neoplasm of unspecified site of right female breast: Secondary | ICD-10-CM

## 2012-08-12 NOTE — Progress Notes (Signed)
   Department of Radiation Oncology  Phone:  [phone] Fax:        [phone]   Electron beam simulation note  Today the patient underwent additional planning for radiation therapy directed at the right chest wall area. The patient's treatment planning CT scan was reviewed and she subsequently had set up of a custom electron cutout field directed at the mastectomy scar area. The patient will be treated with 6 megavoltage electrons.. She will be treated with a 0.5 cm bolus daily to insure adequate dose to the  superficial aspects of the target area. Prescription will be to the 90% isodose line. She will receive 8 treatments for an additional dose of 1440 cGy. A special port plan is requested for treatment.  -----------------------------------  Billie Lade, PhD, MD

## 2012-08-13 ENCOUNTER — Ambulatory Visit
Admission: RE | Admit: 2012-08-13 | Discharge: 2012-08-13 | Disposition: A | Discharge status: Home / Self Care | Payer: Managed Care, Other (non HMO) | Source: Ambulatory Visit | Attending: Radiation Oncology | Admitting: Radiation Oncology

## 2012-08-16 ENCOUNTER — Ambulatory Visit
Admission: RE | Admit: 2012-08-16 | Discharge: 2012-08-16 | Disposition: A | Discharge status: Home / Self Care | Payer: Managed Care, Other (non HMO) | Source: Ambulatory Visit | Attending: Radiation Oncology | Admitting: Radiation Oncology

## 2012-08-17 ENCOUNTER — Encounter: Payer: Self-pay | Admitting: Radiation Oncology

## 2012-08-17 ENCOUNTER — Ambulatory Visit
Admission: RE | Admit: 2012-08-17 | Discharge: 2012-08-17 | Disposition: A | Discharge status: Home / Self Care | Payer: Managed Care, Other (non HMO) | Source: Ambulatory Visit | Attending: Radiation Oncology | Admitting: Radiation Oncology

## 2012-08-17 VITALS — BP 102/66 | HR 71 | Resp 18 | Wt 117.7 lb

## 2012-08-17 DIAGNOSIS — C50911 Malignant neoplasm of unspecified site of right female breast: Secondary | ICD-10-CM

## 2012-08-17 MED ORDER — PROMETHAZINE HCL 25 MG PO TABS: 25.0000 mg | ORAL_TABLET | Freq: Four times a day (QID) | ORAL | Status: DC | PRN

## 2012-08-17 MED ORDER — PROCHLORPERAZINE MALEATE 10 MG PO TABS
10.0000 mg | ORAL_TABLET | Freq: Four times a day (QID) | ORAL | Status: DC | PRN
  Administered 2012-08-17: 10 mg via ORAL
  Filled 2012-08-17: qty 1

## 2012-08-17 NOTE — Progress Notes (Signed)
[facility]    Radiation Oncology [address] [place]     Maryln Gottron, M.D. [place], [place] [address]               Billie Lade, M.D., Ph.D. Phone: [phone]      Molli Hazard A. Kathrynn Running, M.D. Fax: [phone]      Radene Gunning, M.D., Ph.D.         Lurline Hare, M.D.         Grayland Jack, M.D Weekly Treatment Management Note  Name: Kelly [name]     MRN: [id number]        CSN: [id number] Date: 08/17/2012      DOB: 02-15-1961  CC: Annamaria Boots, MD         Hyacinth Meeker    Status: Outpatient  Diagnosis: The encounter diagnosis was Right breast cancer, IDC, multicentric.  Current Dose: 41.4 Gy  Current Fraction: 23  Planned Dose: 59.4 Gy  Narrative: Kelly [name] was seen today for weekly treatment management. The chart was checked and port films  were reviewed. Patient continues to have some fatigue but overall seems to be doing a little better this week. She has some itching and discomfort in the treatment areas. She is also noticed that her right scapular area seems to be more prominent. This area  does not cause her discomfort however.  She does have good range of movement with her right arm and shoulder area.  Review of patient's allergies indicates no known allergies.  Current Outpatient Prescriptions  Medication Sig Dispense Refill  . buPROPion (WELLBUTRIN XL) 150 MG 24 hr tablet Take 150 mg by mouth daily.       Tery Sanfilippo Calcium (STOOL SOFTENER PO) Take by mouth daily.      . hyaluronate sodium (RADIAPLEXRX) GEL Apply 1 application topically 2 (two) times daily. Apply to skin after rad tx and bedtime      . LORazepam (ATIVAN) 0.5 MG tablet Take 1 tablet (0.5 mg total) by mouth every 8 (eight) hours.  30 tablet  0  . non-metallic deodorant (ALRA) MISC Apply 1 application topically daily. Apply after rad tx daily and prn      . polyethylene glycol powder (MIRALAX) powder Take 17 g by mouth daily.  255 g  0  . prochlorperazine (COMPAZINE)  10 MG tablet Take 1 tablet (10 mg total) by mouth every 6 (six) hours as needed.  30 tablet  0  . traZODone (DESYREL) 50 MG tablet Take 150 mg by mouth at bedtime. scheduled      . VIIBRYD 40 MG TABS Take 40 mg by mouth daily.       . promethazine (PHENERGAN) 25 MG tablet Take 1 tablet (25 mg total) by mouth every 6 (six) hours as needed for nausea.  30 tablet  0   Current Facility-Administered Medications  Medication Dose Route Frequency Provider Last Rate Last Dose  . prochlorperazine (COMPAZINE) tablet 10 mg  10 mg Oral Q6H PRN Billie Lade, MD   10 mg at 08/17/12 1500     Physical Examination:  weight is 117 lb 11.2 oz (53.388 kg). Her blood pressure is 102/66 and her pulse is 71. Her respiration is 18.    Wt Readings from Last 3 Encounters:  08/17/12 117 lb 11.2 oz (53.388 kg)  08/11/12 118 lb 8 oz (53.751 kg)  08/03/12 116 lb 12.8 oz (52.98 kg)    The right chest and axillary region shows  brisk erythema but no moist desquamation.   the supraclavicular and upper right back area shows brisk erythema.  There is some soft tissue swelling noted in the axillary and lateral chest wall area Lungs - Normal respiratory effort, chest expands symmetrically. Lungs are clear to auscultation, no crackles or wheezes.  Heart has regular rhythm and rate  Abdomen is soft and non tender with normal bowel sounds  Assessment:  Patient tolerating treatments well except for issues as above.  Plan: Continue treatment per original radiation prescription.  Patient will be referred to the physical therapy/lymphedema clinic to address potential muscle tightness which might be explaining some of her changes in the scapular region.

## 2012-08-17 NOTE — Progress Notes (Signed)
Patient presents to the clinic today unaccompanied for PUT with Dr. Roselind Messier. Patient is alert and oriented to person, place, and time. NO distress noted. Steady gait noted. Pleasant affect noted. Patient denies pain at this time. Hyperpigmentation without desquamation not of right chest wall/treatment area. Patient reports using radiaplex bid as directed. Patient reports fatigue. Reported all findings to Dr. Roselind Messier.

## 2012-08-18 ENCOUNTER — Ambulatory Visit: Payer: Managed Care, Other (non HMO)

## 2012-08-19 ENCOUNTER — Ambulatory Visit
Admission: RE | Admit: 2012-08-19 | Discharge: 2012-08-19 | Disposition: A | Discharge status: Home / Self Care | Payer: Managed Care, Other (non HMO) | Source: Ambulatory Visit | Attending: Radiation Oncology | Admitting: Radiation Oncology

## 2012-08-20 ENCOUNTER — Other Ambulatory Visit: Payer: Self-pay | Admitting: *Deleted

## 2012-08-20 ENCOUNTER — Ambulatory Visit
Admission: RE | Admit: 2012-08-20 | Discharge: 2012-08-20 | Disposition: A | Discharge status: Home / Self Care | Payer: Managed Care, Other (non HMO) | Source: Ambulatory Visit | Attending: Radiation Oncology | Admitting: Radiation Oncology

## 2012-08-20 ENCOUNTER — Telehealth: Payer: Self-pay | Admitting: Radiation Oncology

## 2012-08-20 DIAGNOSIS — C50911 Malignant neoplasm of unspecified site of right female breast: Secondary | ICD-10-CM

## 2012-08-20 NOTE — Telephone Encounter (Signed)
Faxed NPE 06/16/12, path 06/07/12, 05/05/12 to Burna Mortimer at Dr. Donna Christen Chino's office, [phone].  OK per JK. Received confirmation.  Fed exed CD w/sim images.

## 2012-08-23 ENCOUNTER — Other Ambulatory Visit: Payer: Managed Care, Other (non HMO) | Admitting: Lab

## 2012-08-23 ENCOUNTER — Ambulatory Visit
Admission: RE | Admit: 2012-08-23 | Discharge: 2012-08-23 | Disposition: A | Discharge status: Home / Self Care | Payer: Managed Care, Other (non HMO) | Source: Ambulatory Visit | Attending: Radiation Oncology | Admitting: Radiation Oncology

## 2012-08-23 ENCOUNTER — Ambulatory Visit (HOSPITAL_BASED_OUTPATIENT_CLINIC_OR_DEPARTMENT_OTHER): Payer: BC Managed Care – PPO | Admitting: Genetic Counselor

## 2012-08-23 ENCOUNTER — Encounter: Payer: Self-pay | Admitting: Genetic Counselor

## 2012-08-23 DIAGNOSIS — C50519 Malignant neoplasm of lower-outer quadrant of unspecified female breast: Secondary | ICD-10-CM

## 2012-08-23 DIAGNOSIS — C50219 Malignant neoplasm of upper-inner quadrant of unspecified female breast: Secondary | ICD-10-CM

## 2012-08-23 DIAGNOSIS — C50419 Malignant neoplasm of upper-outer quadrant of unspecified female breast: Secondary | ICD-10-CM

## 2012-08-23 DIAGNOSIS — IMO0002 Reserved for concepts with insufficient information to code with codable children: Secondary | ICD-10-CM

## 2012-08-23 DIAGNOSIS — C50911 Malignant neoplasm of unspecified site of right female breast: Secondary | ICD-10-CM

## 2012-08-23 NOTE — Progress Notes (Signed)
Dr.  Raymond Gurney Magrinat requested a consultation for genetic counseling and risk assessment for Kelly [name], a 52 y.o. female, for discussion of her personal history of breast cancer and family history of breast and brain cancer and lymphoma and leukemia. She presents to clinic today to discuss the possibility of a genetic predisposition to cancer, and to further clarify her risks, as well as her family members' risks for cancer.   HISTORY OF PRESENT ILLNESS: In 2013, at the age of 20, Kelly [name] was diagnosed with breast cancer. This was treated with unilateral mastectomy.    Past Medical History  Diagnosis Date  . Anxiety   . Depression   . Breast cancer 05/05/12    right, ER +, PR -, Her 2 -    Past Surgical History  Procedure Date  . Cesarean section     x3  . Joint replacement     Rt and Lt thumbs  . Cervical conization w/bx   . Breast surgery     several biopsies, needle aspiration  . Mastectomy w/ sentinel node biopsy 06/07/2012    Procedure: MASTECTOMY WITH SENTINEL LYMPH NODE BIOPSY;  Surgeon: Currie Paris, MD;  Location: [facility];  Service: General;  Laterality: Right;  right total mastectomy and sentinel node  . Mastectomy modified radical 06/07/2012    Procedure: MASTECTOMY MODIFIED RADICAL;  Surgeon: Currie Paris, MD;  Location: [facility] OR;  Service: General;  Laterality: Right;    History  Substance Use Topics  . Smoking status: Never Smoker   . Smokeless tobacco: Never Used  . Alcohol Use: Yes     Comment: very rare    REPRODUCTIVE HISTORY AND PERSONAL RISK ASSESSMENT FACTORS: Menarche was at age 61.   Premenopausal Uterus Intact: Yes Ovaries Intact: yes G3P3A0 , first live birth at age 41  She has previously undergone treatment for infertility.   OCP use for 12-14 years   She has not used HRT in the past.    FAMILY HISTORY:  We obtained a detailed, 4-generation family history.  Significant diagnoses are listed below: Family History    Problem Relation Age of Onset  . Breast cancer Mother 74  . Lymphoma Mother 86    non-hodgkins lymphoma in her epiglottis  . Stroke Maternal Grandfather   . Bipolar disorder Brother   . Gout Brother   . Breast cancer Other     Kelly [name]'s sister; dx <50  . Breast cancer Other 40    Kelly [name]'s sister  . Brain cancer Other     Kelly [name]'s sister; dx <50  . Leukemia Other     mother's maternal cousin; dx under 10  The patient was diagnosed with breast cancer at 55.  She has two brothers who have not been diagnosed with cancer.  Her mother was diagnosed with non-hodgkin's lymphoma of the epiglottis around age 9, and then diagnosed with breast cancer at age 66.  The patient's maternal grandfather had a sister with breast cancer at age 61, and maternal grandmother had a sister with breast cancer under the age of 20, another sister with brain cancer under 60, and a third sister who had a son with leukemia diagnosed under the age of 33.  The patient's father was adopted and no family history is known.  Patient's maternal ancestors are of [place], [place] and [place] descent, and paternal ancestors are of unknown descent. There is no reported Ashkenazi Jewish ancestry. There is no  known consanguinity.  GENETIC COUNSELING RISK  ASSESSMENT, DISCUSSION, AND SUGGESTED FOLLOW UP: We reviewed the natural history and genetic etiology of sporadic, familial and hereditary cancer syndromes.  About 5-10% of breast cancer is hereditary.  Of this, about 85% is the result of a BRCA1 or BRCA2 mutation.  We reviewed the red flags of hereditary cancer syndromes and the dominant inheritance patterns.  If the BRCA testing is negative, we discussed that we could be testing for the wrong gene.  We discussed gene panels, and that several cancer genes that are associated with different cancers can be tested at the same time.  Because of the different types of cancer that are in the patient's family, we will consider one of the panel tests if she  is negative for BRCA mutations. Testing positive for a gene mutation can help change medical management in regards to screening recommendations for cancer, risk reducing surgeries, as well as identify if there are other cancers that the patient may be at increased risk for.  The patient's personal history of breast cancer is suggestive of the following possible diagnosis: hereditary cancer syndrome  We discussed that identification of a hereditary cancer syndrome may help her care providers tailor the patients medical management. If a mutation indicating a hereditary cancer syndrome is detected in this case, the [employer] recommendations would include increased cancer surveillance and possible prophylactic surgery. If a mutation is detected, the patient will be referred back to the referring provider and to any additional appropriate care providers to discuss the relevant options.   If a mutation is not found in the patient, this will decrease the likelihood of a hereditary cancer syndrome as the explanation for her breast cancer. Cancer surveillance options would be discussed for the patient according to the appropriate standard National Comprehensive Cancer Network and American Cancer Society guidelines, with consideration of their personal and family history risk factors. In this case, the patient will be referred back to their care providers for discussions of management.   After considering the risks, benefits, and limitations, the patient decided to think about testing and will call back if she decides to pursue it.   The patient was seen for a total of 60 minutes, greater than 50% of which was spent face-to-face counseling.  This plan is being carried out per Dr. Raymond Gurney Magrinat's recommendations.  This note will also be sent to the referring provider via the electronic medical record. The patient will be supplied with a summary of this genetic counseling discussion as  well as educational information on the discussed hereditary cancer syndromes following the conclusion of their visit.   Patient was discussed with Dr. Drue Second.  EDUCATIONAL INFORMATION SUPPLIED TO PATIENT AT ENCOUNTER:  Brochure about Myrisk   _______________________________________________________________________ For Office Staff:  Number of people involved in session: 4 Was an Intern/ student involved with case: yes

## 2012-08-24 ENCOUNTER — Ambulatory Visit (HOSPITAL_BASED_OUTPATIENT_CLINIC_OR_DEPARTMENT_OTHER): Payer: BC Managed Care – PPO | Admitting: Oncology

## 2012-08-24 ENCOUNTER — Other Ambulatory Visit (HOSPITAL_BASED_OUTPATIENT_CLINIC_OR_DEPARTMENT_OTHER): Payer: BC Managed Care – PPO | Admitting: Lab

## 2012-08-24 ENCOUNTER — Ambulatory Visit: Payer: Managed Care, Other (non HMO)

## 2012-08-24 VITALS — BP 107/70 | HR 81 | Temp 97.7°F | Resp 20 | Ht 64.5 in | Wt 117.4 lb

## 2012-08-24 DIAGNOSIS — C50219 Malignant neoplasm of upper-inner quadrant of unspecified female breast: Secondary | ICD-10-CM

## 2012-08-24 DIAGNOSIS — C50911 Malignant neoplasm of unspecified site of right female breast: Secondary | ICD-10-CM

## 2012-08-24 DIAGNOSIS — C50419 Malignant neoplasm of upper-outer quadrant of unspecified female breast: Secondary | ICD-10-CM

## 2012-08-24 DIAGNOSIS — C50519 Malignant neoplasm of lower-outer quadrant of unspecified female breast: Secondary | ICD-10-CM

## 2012-08-24 DIAGNOSIS — C773 Secondary and unspecified malignant neoplasm of axilla and upper limb lymph nodes: Secondary | ICD-10-CM

## 2012-08-24 LAB — CBC WITH DIFFERENTIAL/PLATELET
BASO%: 0.4 % (ref 0.0–2.0)
Basophils Absolute: 0 10*3/uL (ref 0.0–0.1)
EOS%: 10.2 % — ABNORMAL HIGH (ref 0.0–7.0)
Eosinophils Absolute: 0.5 10*3/uL (ref 0.0–0.5)
HCT: 41.1 % (ref 34.8–46.6)
HGB: 13.9 g/dL (ref 11.6–15.9)
LYMPH%: 5.7 % — ABNORMAL LOW (ref 14.0–49.7)
MCH: 30.1 pg (ref 25.1–34.0)
MCHC: 33.8 g/dL (ref 31.5–36.0)
MCV: 89 fL (ref 79.5–101.0)
MONO#: 0.5 10*3/uL (ref 0.1–0.9)
MONO%: 10 % (ref 0.0–14.0)
NEUT#: 3.5 10*3/uL (ref 1.5–6.5)
NEUT%: 73.7 % (ref 38.4–76.8)
Platelets: 152 10*3/uL (ref 145–400)
RBC: 4.62 10*6/uL (ref 3.70–5.45)
RDW: 13.3 % (ref 11.2–14.5)
WBC: 4.7 10*3/uL (ref 3.9–10.3)
lymph#: 0.3 10*3/uL — ABNORMAL LOW (ref 0.9–3.3)
nRBC: 0 % (ref 0–0)

## 2012-08-24 NOTE — Progress Notes (Signed)
ID: Kelly [name]   DOB: 11/30/1960  MR#: [id number]  XBM#:[id number]  PCP:  [name] GYN: Annamaria Boots, SU: Cyndia Bent OTHER MD: Antony Blackbird, Dossie Der   HISTORY OF PRESENT ILLNESS: The patient herself noted a change in her right breast. She brought it to her primary care physician, Dr. Ronalee Red attention, and diagnostic mammography was obtained at [facility] 05/05/2012. The breasts were found to be heterogeneously dense. There was a spiculated mass posteriorly and laterally to the nipple line in the right breast, and a second mass more medially. There was an asymmetric soft tissue density in the upper outer quadrant of the left breast, without any discrete mass identified. There was a cluster of microcalcifications medially in the left breast. Ultrasound of the right breast showed a lobulated mass measuring 2.3 cm and a second mass measuring 4.5 cm. There was also a third mass measuring 1.9 cm which was felt possibly to represent a lymph node. In the right axilla there was a suspicious lymph node measuring 1.1 cm.  Biopsy of this 3 breast masses was obtained the same day, and showed all 3 to represent invasive ductal carcinoma, grade 1. Because the 3 lesions were morphologically similar, only one prognostic panel was obtained (from the mass at "7:00"), showing it to be estrogen receptor 100% positive, progesterone receptor negative, with an MIB-1 of 12%, and no HER-2 amplification ([id number]).  Bilateral breast MRI was obtained 05/10/2012, and confirmed the 3 right breast masses in question, measuring 2.1, 2.8 (at the 7:00 position) and 2.2 cm. In addition a 1.3 cm right axillary lymph node was noted. There were no suspicious findings in the left breast. On October 30 fine-needle aspiration of the suspicious right axilla lymph node was performed, with the results being negative ([id number]).   Finally on 06/07/2012 the patient underwent right modified radical  mastectomy. The three masses in the right breast measured 2.2 cm, 3 cm, and 2.3 cm ([id number]). The 2.3 cm mass was morphologically slightly different (grade 2) and a prognostic profile was obtained from this mass ("superior mid-medial"). It was 100% estrogen and 98% progesterone receptor positive. MIB-1 was 16%. There was no HER-2 amplification. A total of 19 lymph nodes were sampled, of which 9 were positive. Margins were negative, but close (1 mm). The patient's subsequent history is as detailed below.  INTERVAL HISTORY: Mischell returns today with her husband Loraine Leriche and their friend Laurine for followup of Shandrika breast cancer. Since her last visit here she started her radiation, which is scheduled to be completed 09/03/2012. She also sought a second opinion at [facility], and met with Kandice Hams and her team for a further discussion of systemic therapy options. According to the patient's notes, Dr. Amie Critchley quoted Bronson Ing a benefit from chemotherapy in the 16% range. Perhaps more importantly, she was very convincing in her advice to the patient regarding consideration of chemotherapy. If that is here today for further discussion.  REVIEW OF SYSTEMS: She is tolerating radiation generally well, with some redness and peeling. She feels moderately tired and occasionally takes naps.. She has had some nausea and vomiting associated with the radiation. Overall a detailed review of systems today was negative except as noted.   PAST MEDICAL HISTORY: Past Medical History  Diagnosis Date  . Anxiety   . Depression   . Breast cancer 05/05/12    right, ER +, PR -, Her 2 -   sleep apnea; migraines;  PAST SURGICAL HISTORY: Past Surgical History  Procedure Date  . Cesarean section     x3  . Joint replacement     Rt and Lt thumbs  . Cervical conization w/bx   . Breast surgery     several biopsies, needle aspiration  . Mastectomy w/ sentinel node biopsy 06/07/2012    Procedure: MASTECTOMY WITH SENTINEL LYMPH  NODE BIOPSY;  Surgeon: Currie Paris, MD;  Location: [facility] OR;  Service: General;  Laterality: Right;  right total mastectomy and sentinel node  . Mastectomy modified radical 06/07/2012    Procedure: MASTECTOMY MODIFIED RADICAL;  Surgeon: Currie Paris, MD;  Location: [facility] OR;  Service: General;  Laterality: Right;    FAMILY HISTORY Family History  Problem Relation Age of Onset  . Breast cancer Mother 39  . Lymphoma Mother 10    non-hodgkins lymphoma in her epiglottis  . Stroke Maternal Grandfather   . Bipolar disorder Brother   . Gout Brother   . Breast cancer Other     MGM's sister; dx <50  . Breast cancer Other 40    MGF's sister  . Brain cancer Other     MGM's sister; dx <50  . Leukemia Other     mother's maternal cousin; dx under 10   the patient's mother, Reisa Coppola, is also my patient and has a history of breast cancer. The patient's father was adopted. Amylynn has 2 brothers, no sisters. There is no history of ovarian cancer in the family.  GYNECOLOGIC HISTORY: She had menarche age 32, first live birth age 50. She is GX P3. Her periods are regular.  SOCIAL HISTORY: Jamieka volunteers at the [employer], at church [facility]) and for other causes. Her husband Loraine Leriche                         . Their children are currently 17 years old (doing the [place] trail), 79 (freshman at [employer]), and 11.   ADVANCED DIRECTIVES: Not in place  HEALTH MAINTENANCE: History  Substance Use Topics  . Smoking status: Never Smoker   . Smokeless tobacco: Never Used  . Alcohol Use: Yes     Comment: very rare     Colonoscopy:  PAP:  Bone density:  Lipid panel:  No Known Allergies  Current Outpatient Prescriptions  Medication Sig Dispense Refill  . buPROPion (WELLBUTRIN XL) 150 MG 24 hr tablet Take 150 mg by mouth daily.       Tery Sanfilippo Calcium (STOOL SOFTENER PO) Take by mouth daily.      . hyaluronate sodium (RADIAPLEXRX) GEL Apply 1  application topically 2 (two) times daily. Apply to skin after rad tx and bedtime      . LORazepam (ATIVAN) 0.5 MG tablet Take 1 tablet (0.5 mg total) by mouth every 8 (eight) hours.  30 tablet  0  . non-metallic deodorant (ALRA) MISC Apply 1 application topically daily. Apply after rad tx daily and prn      . polyethylene glycol powder (MIRALAX) powder Take 17 g by mouth daily.  255 g  0  . prochlorperazine (COMPAZINE) 10 MG tablet Take 1 tablet (10 mg total) by mouth every 6 (six) hours as needed.  30 tablet  0  . promethazine (PHENERGAN) 25 MG tablet Take 1 tablet (25 mg total) by mouth every 6 (six) hours as needed for nausea.  30 tablet  0  . traZODone (DESYREL) 50 MG tablet Take 150 mg by mouth at bedtime. scheduled      .  VIIBRYD 40 MG TABS Take 40 mg by mouth daily.         OBJECTIVE: Middle-aged white woman who appears stressed Filed Vitals:   08/24/12 0916  BP: 107/70  Pulse: 81  Temp: 97.7 F (36.5 C)  Resp: 20     Body mass index is 19.84 kg/(m^2).    ECOG FS: 1  Sclerae unicteric Oropharynx clear No cervical or supraclavicular adenopathy Lungs no rales or rhonchi Heart regular rate and rhythm Abd benign MSK no focal spinal tenderness, no peripheral edema Neuro: nonfocal, well oriented, upper. Affect Breasts: The right breast is status post mastectomy. There is dry desquamation over the radiation port area. The right axilla is benign. The left breast is unremarkable   LAB RESULTS: Lab Results  Component Value Date   WBC 4.7 08/24/2012   NEUTROABS 3.5 08/24/2012   HGB 13.9 08/24/2012   HCT 41.1 08/24/2012   MCV 89.0 08/24/2012   PLT 152 08/24/2012      Chemistry      Component Value Date/Time   NA 142 08/05/2012 1454   NA 137 03/01/2011 0315   K 4.3 08/05/2012 1454   K 4.1 03/01/2011 0315   CL 102 08/05/2012 1454   CL 106 03/01/2011 0315   CO2 30* 08/05/2012 1454   CO2 26 03/01/2011 0315   BUN 15.0 08/05/2012 1454   BUN 8 03/01/2011 0315   CREATININE 0.8 08/05/2012 1454    CREATININE 0.60 03/01/2011 0315      Component Value Date/Time   CALCIUM 8.8 08/05/2012 1454   CALCIUM 8.5 03/01/2011 0315   ALKPHOS 74 08/05/2012 1454   ALKPHOS 63 02/28/2011 0540   AST 16 08/05/2012 1454   AST 28 02/28/2011 0540   ALT 9 08/05/2012 1454   ALT 19 02/28/2011 0540   BILITOT 0.43 08/05/2012 1454   BILITOT 1.3* 02/28/2011 0540       Lab Results  Component Value Date   LABCA2 31 06/18/2012    No components found with this basename: [id number]    No results found for this basename: INR:1;PROTIME:1 in the last 168 hours  Urinalysis    Component Value Date/Time   COLORURINE YELLOW 06/09/2012 0509   APPEARANCEUR CLEAR 06/09/2012 0509   LABSPEC 1.012 06/09/2012 0509   PHURINE 7.5 06/09/2012 0509   GLUCOSEU NEGATIVE 06/09/2012 0509   HGBUR NEGATIVE 06/09/2012 0509   BILIRUBINUR NEGATIVE 06/09/2012 0509   KETONESUR NEGATIVE 06/09/2012 0509   PROTEINUR NEGATIVE 06/09/2012 0509   UROBILINOGEN 0.2 06/09/2012 0509   NITRITE NEGATIVE 06/09/2012 0509   LEUKOCYTESUR NEGATIVE 06/09/2012 0509    STUDIES: PET scan obtained at [facility] 05/14/2012 showed uptake only in the right breast area (SUV. of 4.1 and 2.9). In addition, a left pelvis area of uptake, poorly characterized, had an SUV of 3.8. This was felt likely to represent a loop of unopacified small bowel.  ASSESSMENT: 52 y.o. [place] woman status post right modified radical mastectomy 06/07/2012 for a multifocal invasive ductal carcinoma, the largest of 3 masses measuring 3.0 cm, grade 1; a second mass measuring 2.3 cm, grade 2; and a third mass measuring 2.2 cm, grade 1. The 2 larger masses were both 100% estrogen receptor positive, the grade 2 mass being also progesterone receptor positive at 98%, with an MIB-1 of 16% (the larger mass was estrogen receptor negative, with an Mib-1 of 12%). Both showed no HER-2 amplification. 9 of 19 axillary lymph nodes sampled were involved. 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  (5) the patient is considering genetic testing   PLAN: We spent the better part of today's hour-plus visit going over Kenidi's situation in detail. I again printed out the adjuvant! Online data which would quote her a 67% risk of relapse with local treatment only, dropping by 18% with hormone treatment, and an additional 15% with chemotherapy added on top of the hormone treatment. (The chemotherapy without hormone treatment would also give her an 18% decrease in risk.) I interpreted this data for her and her family and gave him the information in writing.  We then discussed standard chemotherapy, which could consist of doxorubicin and cyclophosphamide in dose dense fashion followed by paclitaxel in dose dense fashion, or cyclophosphamide, doxorubicin and docetaxel given every 3 weeks x6. The concern I have with these standard regimens is the risk of radiation recall with anthracyclines. The risk is fair with other agents, of course, but less so. For that reason I think it may be attractive for her to consider cyclophosphamide and docetaxel given 6 times, every 3 weeks. I have a note out to Dr. Amie Critchley regarding her experience giving chemotherapy after radiation and whether she feels anthracyclines are to be avoided at this point or should be given a trial.  After much discussion what Lyllie is interested in is nonstandard treatment that might keep her from losing her hair and from gaining weight. This suggests CMF, though in my experience about half the patient's lose enough hair to make a difference to them, and of course the actual benefit in terms of  risk reduction may be less (9% as opposed to 15 or 16% according to the adjuvant! Program, though in some studies at least the benefit of adding anthracyclines in estrogen receptor positive cases has not been marked).  At this point he did just wants to think about it. If she decides for treatment she will need a port, and she is seeing Cicero Duck the second week in February. She is seeing me shortly thereafter. If she has decided against chemotherapy, we will start tamoxifen at that time.   MAGRINAT,GUSTAV C    08/24/2012

## 2012-08-25 ENCOUNTER — Ambulatory Visit: Payer: Managed Care, Other (non HMO)

## 2012-08-26 ENCOUNTER — Telehealth (INDEPENDENT_AMBULATORY_CARE_PROVIDER_SITE_OTHER): Payer: Self-pay

## 2012-08-26 ENCOUNTER — Ambulatory Visit
Admission: RE | Admit: 2012-08-26 | Discharge: 2012-08-26 | Disposition: A | Discharge status: Home / Self Care | Payer: Managed Care, Other (non HMO) | Source: Ambulatory Visit | Attending: Radiation Oncology | Admitting: Radiation Oncology

## 2012-08-26 ENCOUNTER — Telehealth: Payer: Self-pay | Admitting: Oncology

## 2012-08-26 NOTE — Telephone Encounter (Signed)
Pt would like a sooner appt with Dr. Jamey Ripa.  She has many issues to discuss.  Please call her.

## 2012-08-26 NOTE — Telephone Encounter (Signed)
Pt would like to see Dr. Dwain Sarna sooner than scheduled appt.  She has several issues to discuss regarding her f/u care.

## 2012-08-26 NOTE — Telephone Encounter (Signed)
Last message sent in error.  Dr. Tenna Child patient.

## 2012-08-26 NOTE — Telephone Encounter (Signed)
S/w the pt and she is aware of her chemo educ class along with the md visit. S/w Kelly [name] at the [employer] and she stated that she wanted me to fax over the work order for the biopsy and that she will call dr [name] to see exactly what he wants and take it from that point and she will call the pt directly with an appt.

## 2012-08-26 NOTE — Telephone Encounter (Signed)
Left message on machine for patient to call back and ask for me. To offer appt for tomorrow with Dr Jamey Ripa - if she can not come tomorrow the only other option is to keep her appt on 09/10/12. Dr Jamey Ripa has no clinics next week and has bumped part of his clinic on 09/07/12. Awaiting call back.

## 2012-08-27 ENCOUNTER — Ambulatory Visit
Admission: RE | Admit: 2012-08-27 | Discharge: 2012-08-27 | Disposition: A | Discharge status: Home / Self Care | Payer: Managed Care, Other (non HMO) | Source: Ambulatory Visit | Attending: Radiation Oncology | Admitting: Radiation Oncology

## 2012-08-27 ENCOUNTER — Ambulatory Visit: Payer: BC Managed Care – PPO | Admitting: Lab

## 2012-08-27 ENCOUNTER — Ambulatory Visit (HOSPITAL_COMMUNITY)
Admission: RE | Admit: 2012-08-27 | Discharge: 2012-08-27 | Disposition: A | Discharge status: Home / Self Care | Payer: BC Managed Care – PPO | Source: Ambulatory Visit | Attending: Oncology | Admitting: Oncology

## 2012-08-27 DIAGNOSIS — H539 Unspecified visual disturbance: Secondary | ICD-10-CM | POA: Insufficient documentation

## 2012-08-27 DIAGNOSIS — C50919 Malignant neoplasm of unspecified site of unspecified female breast: Secondary | ICD-10-CM | POA: Insufficient documentation

## 2012-08-27 DIAGNOSIS — R51 Headache: Secondary | ICD-10-CM | POA: Insufficient documentation

## 2012-08-27 DIAGNOSIS — C50911 Malignant neoplasm of unspecified site of right female breast: Secondary | ICD-10-CM

## 2012-08-27 MED ORDER — GADOBENATE DIMEGLUMINE 529 MG/ML IV SOLN
10.0000 mL | Freq: Once | INTRAVENOUS | Status: AC | PRN
  Administered 2012-08-27: 10 mL via INTRAVENOUS

## 2012-08-28 ENCOUNTER — Encounter: Payer: Self-pay | Admitting: Oncology

## 2012-08-28 ENCOUNTER — Encounter (INDEPENDENT_AMBULATORY_CARE_PROVIDER_SITE_OTHER): Payer: Self-pay | Admitting: Surgery

## 2012-08-29 ENCOUNTER — Other Ambulatory Visit: Payer: Self-pay | Admitting: Oncology

## 2012-08-30 ENCOUNTER — Other Ambulatory Visit: Payer: Self-pay | Admitting: Oncology

## 2012-08-30 ENCOUNTER — Ambulatory Visit
Admission: RE | Admit: 2012-08-30 | Discharge: 2012-08-30 | Disposition: A | Discharge status: Home / Self Care | Payer: Managed Care, Other (non HMO) | Source: Ambulatory Visit | Attending: Radiation Oncology | Admitting: Radiation Oncology

## 2012-08-30 NOTE — Progress Notes (Signed)
Kelly [name] dropped in today so I could evaluate a "bump" on her tongue. She denies biting it and has had no ulcers or pain associated with this.  On exam there is a slight swelling on the anterior lateral left tongue, which is not read, tender, or forearm. It does not look like a bite.  I reassured her that this was a mild inflammation of some sort, otherwise not entirely sure what it is. It seems to be getting better already over the past 24 hours, and it has not completely disappeared by week from today she will let me know  Incidentally she has definitively decided not to receive chemotherapy. She tells me she is at peace with that decision. She is having her left breast removed in the near future. She will not need a port to be placed at that time.  She has an appointment with me on February 18 but she may cancel that at her discretion. I wrote her a prescription for tamoxifen to start on March 1 (she finishes radiation this week). Ideally she would see me again in mid April after having been on tamoxifen at least 6 weeks.

## 2012-08-31 ENCOUNTER — Ambulatory Visit: Payer: Managed Care, Other (non HMO)

## 2012-08-31 ENCOUNTER — Ambulatory Visit: Payer: BC Managed Care – PPO | Attending: Radiation Oncology | Admitting: Physical Therapy

## 2012-08-31 ENCOUNTER — Encounter: Payer: Self-pay | Admitting: Radiation Oncology

## 2012-08-31 ENCOUNTER — Ambulatory Visit
Admission: RE | Admit: 2012-08-31 | Discharge: 2012-08-31 | Disposition: A | Discharge status: Home / Self Care | Payer: Managed Care, Other (non HMO) | Source: Ambulatory Visit | Attending: Radiation Oncology | Admitting: Radiation Oncology

## 2012-08-31 VITALS — BP 98/60 | HR 63 | Resp 16 | Wt 117.0 lb

## 2012-08-31 DIAGNOSIS — C50911 Malignant neoplasm of unspecified site of right female breast: Secondary | ICD-10-CM

## 2012-08-31 DIAGNOSIS — M25519 Pain in unspecified shoulder: Secondary | ICD-10-CM | POA: Insufficient documentation

## 2012-08-31 DIAGNOSIS — IMO0001 Reserved for inherently not codable concepts without codable children: Secondary | ICD-10-CM | POA: Insufficient documentation

## 2012-08-31 DIAGNOSIS — M24519 Contracture, unspecified shoulder: Secondary | ICD-10-CM | POA: Insufficient documentation

## 2012-08-31 DIAGNOSIS — M6281 Muscle weakness (generalized): Secondary | ICD-10-CM | POA: Insufficient documentation

## 2012-08-31 NOTE — Progress Notes (Signed)
Patient presents to the clinic today unaccompanied for PUT with Dr. Roselind Messier. Patient is alert and oriented to person, place, and time. No distress noted. Steady gait noted. Pleasant affect noted. Patient denies pain at this time. Patient obtained a second opinion at [facility]. Patient has decided to have her left breast removed. Also, she has decided against chemotherapy. She is awaiting Dr. Gaston Islam to return her call reference left breast mastectomy. Patient reports fatigue. Right chest wall with hyperpigmentation and dry desquamation. Patient reports using radiaplex bid. Skin looks great! Reported all findings to Dr. Roselind Messier.

## 2012-08-31 NOTE — Progress Notes (Signed)
[facility]    Radiation Oncology [address] [place]     Maryln Gottron, M.D. [place], [place] [address]               Billie Lade, M.D., Ph.D. Phone: [phone]      Molli Hazard A. Kathrynn Running, M.D. Fax: [phone]      Radene Gunning, M.D., Ph.D.         Lurline Hare, M.D.         Grayland Jack, M.D Weekly Treatment Management Note  Name: Kelly [name]     MRN: [id number]        CSN: [id number] Date: 08/31/2012      DOB: Jun 04, 1961  CC: Hollice Espy, MD         Kevan Ny    Status: Outpatient  Diagnosis: The encounter diagnosis was Right breast cancer, IDC, multicentric.  Current Dose: 54 Gy  Current Fraction: 30  Planned Dose: 59.4 Gy  Narrative: Kelly [name] was seen today for weekly treatment management. The chart was checked and port films  were reviewed and electron setup checked.  She is having some itching and discomfort along the treatment area.  She seems to be in much better spirits today. She did undergo consultation at [facility] who also recommended adjuvant chemotherapy but she has decided against this therapy.  At this time the patient has decided to proceed with prophylactic left mastectomy and will be meeting with Dr. Jamey Ripa in the next few days.  she will proceed with tamoxifen after completion of her radiation therapy.  Review of patient's allergies indicates no known allergies.  Current Outpatient Prescriptions  Medication Sig Dispense Refill  . buPROPion (WELLBUTRIN XL) 150 MG 24 hr tablet Take 150 mg by mouth daily.       Tery Sanfilippo Calcium (STOOL SOFTENER PO) Take by mouth daily.      . hyaluronate sodium (RADIAPLEXRX) GEL Apply 1 application topically 2 (two) times daily. Apply to skin after rad tx and bedtime      . IOPHEN C-NR 100-10 MG/5ML syrup       . LORazepam (ATIVAN) 0.5 MG tablet Take 1 tablet (0.5 mg total) by mouth every 8 (eight) hours.  30 tablet  0  . non-metallic deodorant (ALRA) MISC Apply 1 application topically  daily. Apply after rad tx daily and prn      . polyethylene glycol powder (MIRALAX) powder Take 17 g by mouth daily.  255 g  0  . prochlorperazine (COMPAZINE) 10 MG tablet Take 1 tablet (10 mg total) by mouth every 6 (six) hours as needed.  30 tablet  0  . promethazine (PHENERGAN) 25 MG tablet Take 1 tablet (25 mg total) by mouth every 6 (six) hours as needed for nausea.  30 tablet  0  . traZODone (DESYREL) 50 MG tablet Take 150 mg by mouth at bedtime. scheduled      . azithromycin (ZITHROMAX) 250 MG tablet       . benzonatate (TESSALON) 100 MG capsule       . VIIBRYD 40 MG TABS Take 40 mg by mouth daily.        Labs:  Lab Results  Component Value Date   WBC 4.7 08/24/2012   HGB 13.9 08/24/2012   HCT 41.1 08/24/2012   MCV 89.0 08/24/2012   PLT 152 08/24/2012   Lab Results  Component Value Date   CREATININE 0.8 08/05/2012   BUN 15.0 08/05/2012   NA 142 08/05/2012  K 4.3 08/05/2012   CL 102 08/05/2012   CO2 30* 08/05/2012   Lab Results  Component Value Date   ALT 9 08/05/2012   AST 16 08/05/2012   PHOS 3.3 02/28/2011   BILITOT 0.43 08/05/2012    Physical Examination:  weight is 117 lb (53.071 kg). Her blood pressure is 98/60 and her pulse is 63. Her respiration is 16.    Wt Readings from Last 3 Encounters:  08/31/12 117 lb (53.071 kg)  08/24/12 117 lb 6.4 oz (53.252 kg)  08/17/12 117 lb 11.2 oz (53.388 kg)    The right chest wall area shows erythema hyperpigmentation changes and dry desquamation but no moist desquamation. Lungs - Normal respiratory effort, chest expands symmetrically. Lungs are clear to auscultation, no crackles or wheezes.  Heart has regular rhythm and rate  Abdomen is soft and non tender with normal bowel sounds  Assessment:  Patient tolerating treatments well except for issues as above.  Plan: Continue treatment per original radiation prescription.  I have advised her to use the triple antibiotic ointment in the event she develops moist desquamation in the treatment area.

## 2012-09-01 ENCOUNTER — Ambulatory Visit: Payer: Managed Care, Other (non HMO)

## 2012-09-01 ENCOUNTER — Ambulatory Visit: Payer: BC Managed Care – PPO

## 2012-09-01 ENCOUNTER — Ambulatory Visit
Admission: RE | Admit: 2012-09-01 | Discharge: 2012-09-01 | Disposition: A | Discharge status: Home / Self Care | Payer: Managed Care, Other (non HMO) | Source: Ambulatory Visit | Attending: Radiation Oncology | Admitting: Radiation Oncology

## 2012-09-01 ENCOUNTER — Other Ambulatory Visit (INDEPENDENT_AMBULATORY_CARE_PROVIDER_SITE_OTHER): Payer: Self-pay | Admitting: Surgery

## 2012-09-02 ENCOUNTER — Ambulatory Visit
Admission: RE | Admit: 2012-09-02 | Discharge: 2012-09-02 | Disposition: A | Discharge status: Home / Self Care | Payer: Managed Care, Other (non HMO) | Source: Ambulatory Visit | Attending: Radiation Oncology | Admitting: Radiation Oncology

## 2012-09-02 ENCOUNTER — Telehealth (INDEPENDENT_AMBULATORY_CARE_PROVIDER_SITE_OTHER): Payer: Self-pay | Admitting: General Surgery

## 2012-09-02 NOTE — Telephone Encounter (Signed)
Pt called with questions about surgery scheduled with Dr. Jamey Ripa on 09/17/12.  She is having a Lt mastectomy and wants to be assured it is "medically necessary" for her insurance company to pay for it.  No recent office notes to review.  Please call her.

## 2012-09-03 ENCOUNTER — Ambulatory Visit
Admission: RE | Admit: 2012-09-03 | Discharge: 2012-09-03 | Disposition: A | Discharge status: Home / Self Care | Payer: Managed Care, Other (non HMO) | Source: Ambulatory Visit | Attending: Radiation Oncology | Admitting: Radiation Oncology

## 2012-09-03 ENCOUNTER — Encounter: Payer: Self-pay | Admitting: Radiation Oncology

## 2012-09-03 ENCOUNTER — Ambulatory Visit: Payer: Managed Care, Other (non HMO)

## 2012-09-03 VITALS — BP 104/67 | HR 80 | Resp 18 | Wt 113.1 lb

## 2012-09-03 DIAGNOSIS — C50911 Malignant neoplasm of unspecified site of right female breast: Secondary | ICD-10-CM

## 2012-09-03 NOTE — Telephone Encounter (Signed)
I would imagine with her multicentric disease in the other breast we could make a good argument for left prophylactic mastectomy but that is something she will need to discuss with Dr Jamey Ripa at her appt with him next week.

## 2012-09-03 NOTE — Progress Notes (Signed)
Patient presents to the clinic today accompanied by several family members for a PUT following her final treatment. Patient alert and oriented to person, place, and time. No distress noted. Steady gait noted. Pleasant affect noted. Patient denies pain at this time. Hyperpigmentation of right chest wall noted without desquamation. Encouraged patient to continue to use radiaplex gel bid for next two weeks. Reviewed FYNN and ABC flyer with patient. Encouraged patient to contact staff with future needs. Appointment card for one month follow up given. Patient verbalized understanding of all reviewed.

## 2012-09-03 NOTE — Telephone Encounter (Signed)
Left detailed VM on identified phone number, with the information regarding her upcoming surgery.  Reiterated the need to come in to see Dr. Jamey Ripa for discussion and to have her questions answered.

## 2012-09-07 ENCOUNTER — Other Ambulatory Visit: Payer: BC Managed Care – PPO

## 2012-09-07 ENCOUNTER — Other Ambulatory Visit (INDEPENDENT_AMBULATORY_CARE_PROVIDER_SITE_OTHER): Payer: Self-pay | Admitting: Surgery

## 2012-09-08 ENCOUNTER — Ambulatory Visit: Payer: BC Managed Care – PPO | Admitting: Physical Therapy

## 2012-09-10 ENCOUNTER — Encounter (INDEPENDENT_AMBULATORY_CARE_PROVIDER_SITE_OTHER): Payer: Managed Care, Other (non HMO) | Admitting: Surgery

## 2012-09-11 ENCOUNTER — Other Ambulatory Visit: Payer: Self-pay

## 2012-09-13 ENCOUNTER — Telehealth (INDEPENDENT_AMBULATORY_CARE_PROVIDER_SITE_OTHER): Payer: Self-pay | Admitting: General Surgery

## 2012-09-13 NOTE — Telephone Encounter (Signed)
LMOM for patient making her aware appt has opened tomorrow pm with Dr Jamey Ripa at 4:30pm. I have asked patient to call and confirm if she can make it to that appt.

## 2012-09-13 NOTE — Telephone Encounter (Signed)
The pt called back and she confirmed that she can come tomorrow.

## 2012-09-14 ENCOUNTER — Ambulatory Visit (INDEPENDENT_AMBULATORY_CARE_PROVIDER_SITE_OTHER): Payer: BC Managed Care – PPO | Admitting: Surgery

## 2012-09-14 ENCOUNTER — Encounter (INDEPENDENT_AMBULATORY_CARE_PROVIDER_SITE_OTHER): Payer: Self-pay | Admitting: Surgery

## 2012-09-14 ENCOUNTER — Ambulatory Visit: Payer: BC Managed Care – PPO | Admitting: Oncology

## 2012-09-14 VITALS — BP 118/65 | HR 65 | Temp 98.4°F | Resp 12 | Ht 64.0 in | Wt 116.4 lb

## 2012-09-14 DIAGNOSIS — C50919 Malignant neoplasm of unspecified site of unspecified female breast: Secondary | ICD-10-CM

## 2012-09-14 DIAGNOSIS — C50911 Malignant neoplasm of unspecified site of right female breast: Secondary | ICD-10-CM

## 2012-09-14 NOTE — Patient Instructions (Signed)
We will make a final decision about surgery after you have seen a plastic surgeon

## 2012-09-14 NOTE — Progress Notes (Signed)
CC: Desires prophylactic left mastectomy  HPI: Patient recently finished radiation following right mastectomy for multi-focal right breast cancer. She has declined chemo. At dx she had some suspicious clacs n the left breast but the MRI was negative. She wishes a left mastectomy and is tentatively scheduled for that. PFSH: See prior notes and Epic  Exam: Vital:BP 118/65  Pulse 65  Temp(Src) 98.4 F (36.9 C) (Temporal)  Resp 12  Ht 5\' 4"  (1.626 m)  Wt 116 lb 6.4 oz (52.799 kg)  BMI 19.97 kg/m2  LMP 08/03/2012 VITAL SIGNS: BP 118/65  Pulse 65  Temp(Src) 98.4 F (36.9 C) (Temporal)  Resp 12  Ht 5\' 4"  (1.626 m)  Wt 116 lb 6.4 oz (52.799 kg)  BMI 19.97 kg/m2  LMP 08/03/2012 GENERAL:  The patient is alert, oriented, and generally healthy-appearing, NAD. Mood and affect are normal.  HEENT:  The head is normocephalic, the eyes nonicteric, the pupils were round regular and equal. EOMs are normal. Pharynx normal. Dentition good.  NECK:  The neck is supple and there are no masses or thyromegaly.  LUNGS: Normal respirations and clear to auscultation.  HEART: Regular rhythm, with no murmurs rubs or gallops. Pulses are intact carotid dorsalis pedis and posterior tibial. No significant varicosities are noted.  BREASTS: Right is surgically absent with recent radiation. Left is normal  ABDOMEN: Soft, flat, and nontender. No masses or organomegaly is noted. No hernias are noted. Bowel sounds are normal.  EXTREMITIES:  Good range of motion, no edema.  IMP: Right breast cancer, S/P mastectomy, radiation Calcs, left breast, prob benign based on MRI  Rec. Long talk with her and husband. Explained that no long term improved survival with prophylactic mastectomy, but if no surgery would re-evaluate the calcs for bx. She is interested in reconstruction and might want to do that at the time of mastectomy. She knows she will need a delay on the right to allow recovery from the radiation. She  could have left mastectomy at time of reconstruction on the right, have mastectomy and delayed reconstruction, or mastectomy and reconstruction with the right being done later. If we do immediate reconstruction she might be candidate for nipple sparing.  We will arrange a plastic surgery consult to help her decide.

## 2012-09-15 ENCOUNTER — Encounter: Payer: Self-pay | Admitting: Radiation Oncology

## 2012-09-15 ENCOUNTER — Telehealth (INDEPENDENT_AMBULATORY_CARE_PROVIDER_SITE_OTHER): Payer: Self-pay | Admitting: General Surgery

## 2012-09-15 NOTE — Progress Notes (Signed)
  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     Indication for treatment:  Post mastectomy to reduce chances for local regional recurrence       Radiation treatment dates:   07/14/12-09/03/12  Site/dose:   Right chest wall, axilla/right supraclav fossa-45 Gy in 25 Fxs                      Chest wall/mastectomy scar boosted to 59.4 Gy  Beams/energy:   4 field setup initially 6 MV photons, electron boost to chest wall 6 MeV electrons  Narrative: The patient tolerated radiation treatment relatively well.   She did experience fatigue and itching/discomfort in the chest wall/axillary area but no moist desq.  Plan: The patient has completed radiation treatment. The patient will return to radiation oncology clinic for routine followup in one month. I advised them to call or return sooner if they have any questions or concerns related to their recovery or treatment.  -----------------------------------  Billie Lade, PhD, MD

## 2012-09-15 NOTE — Addendum Note (Signed)
Addended byLiliana Cline on: 09/15/2012 09:37 AM   Modules accepted: Orders

## 2012-09-15 NOTE — Telephone Encounter (Signed)
Left message with patient on voicemail day and time  09/20/12 3pm

## 2012-09-20 NOTE — Progress Notes (Signed)
No show

## 2012-09-21 ENCOUNTER — Telehealth (INDEPENDENT_AMBULATORY_CARE_PROVIDER_SITE_OTHER): Payer: Self-pay | Admitting: General Surgery

## 2012-09-21 ENCOUNTER — Telehealth: Payer: Self-pay | Admitting: Genetic Counselor

## 2012-09-21 NOTE — Telephone Encounter (Signed)
Left message on machine for patient to call back and ask for me. To make her aware I received note from Dr Kelly Splinter which stated they were leaning towards going ahead with mastectomy and re-discussing reconstruction after she undergoes radiation. I wanted to check with patient and make sure this is the plan. Awaiting call back.

## 2012-09-21 NOTE — Telephone Encounter (Signed)
Revealed negative genetic testing.    

## 2012-09-21 NOTE — Telephone Encounter (Signed)
Spoke with patient. She is going to wait on any reconstruction until at least 6 months after radiation if she decides to have this done. She does want to proceed with scheduled mastectomy. She will call with any questions.

## 2012-09-27 ENCOUNTER — Telehealth (INDEPENDENT_AMBULATORY_CARE_PROVIDER_SITE_OTHER): Payer: Self-pay | Admitting: General Surgery

## 2012-09-27 ENCOUNTER — Encounter (HOSPITAL_BASED_OUTPATIENT_CLINIC_OR_DEPARTMENT_OTHER): Payer: Self-pay | Admitting: *Deleted

## 2012-09-27 NOTE — Progress Notes (Signed)
Pt had a rt mastectomy with axillary node dissection 10/13 in main hospital-she was upset she went home next day and had such severe constipation and had to go to ER. She has chronic constipation. Told her all narcotics constipate, so she may want to take a stool softner/senna tab 2 x daily and miralax if needed. She does not understand why she is not having sent node bx this time. I told her she needs to call dr Weldon Inches office to talk about that. She can come see the RCC rooms if she likes. No labs are needed.

## 2012-09-27 NOTE — Telephone Encounter (Signed)
Patient calling wondering why she was not going to get a sentinel node biopsy during her prophylactic mastectomy. I told her they would not plan a node removal without a known cancer in her breast. Even to remove a couple lymph nodes there is a risk of lymphedema and they would not want to do that if it is not necessary. She expressed understanding.

## 2012-09-30 ENCOUNTER — Encounter (HOSPITAL_BASED_OUTPATIENT_CLINIC_OR_DEPARTMENT_OTHER)
Admission: RE | Disposition: A | Discharge status: Home / Self Care | Payer: Self-pay | Source: Ambulatory Visit | Attending: Surgery

## 2012-09-30 ENCOUNTER — Ambulatory Visit (HOSPITAL_BASED_OUTPATIENT_CLINIC_OR_DEPARTMENT_OTHER): Payer: BC Managed Care – PPO | Admitting: *Deleted

## 2012-09-30 ENCOUNTER — Encounter (HOSPITAL_BASED_OUTPATIENT_CLINIC_OR_DEPARTMENT_OTHER): Payer: Self-pay | Admitting: *Deleted

## 2012-09-30 ENCOUNTER — Ambulatory Visit (HOSPITAL_BASED_OUTPATIENT_CLINIC_OR_DEPARTMENT_OTHER)
Admission: RE | Admit: 2012-09-30 | Discharge: 2012-10-01 | Disposition: A | Discharge status: Home / Self Care | Payer: BC Managed Care – PPO | Source: Ambulatory Visit | Attending: Surgery | Admitting: Surgery

## 2012-09-30 DIAGNOSIS — Z4001 Encounter for prophylactic removal of breast: Secondary | ICD-10-CM | POA: Insufficient documentation

## 2012-09-30 DIAGNOSIS — Z853 Personal history of malignant neoplasm of breast: Secondary | ICD-10-CM | POA: Insufficient documentation

## 2012-09-30 DIAGNOSIS — N6019 Diffuse cystic mastopathy of unspecified breast: Secondary | ICD-10-CM

## 2012-09-30 DIAGNOSIS — C50911 Malignant neoplasm of unspecified site of right female breast: Secondary | ICD-10-CM

## 2012-09-30 DIAGNOSIS — R920 Mammographic microcalcification found on diagnostic imaging of breast: Secondary | ICD-10-CM

## 2012-09-30 HISTORY — PX: SIMPLE MASTECTOMY WITH AXILLARY SENTINEL NODE BIOPSY: SHX6098

## 2012-09-30 SURGERY — SIMPLE MASTECTOMY
Anesthesia: General | Site: Breast | Laterality: Left | Wound class: Clean

## 2012-09-30 MED ORDER — HYDROMORPHONE HCL PF 1 MG/ML IJ SOLN
0.2500 mg | INTRAMUSCULAR | Status: DC | PRN
  Administered 2012-09-30: 0.5 mg via INTRAVENOUS
  Administered 2012-09-30: 0.25 mg via INTRAVENOUS
  Administered 2012-09-30: 0.5 mg via INTRAVENOUS

## 2012-09-30 MED ORDER — ACETAMINOPHEN 10 MG/ML IV SOLN
1000.0000 mg | Freq: Once | INTRAVENOUS | Status: AC
  Administered 2012-09-30: 1000 mg via INTRAVENOUS

## 2012-09-30 MED ORDER — PROMETHAZINE HCL 25 MG/ML IJ SOLN
6.2500 mg | INTRAMUSCULAR | Status: DC | PRN
  Administered 2012-09-30: 6.25 mg via INTRAVENOUS

## 2012-09-30 MED ORDER — DEXTROSE IN LACTATED RINGERS 5 % IV SOLN
INTRAVENOUS | Status: DC
  Administered 2012-09-30: 75 mL/h via INTRAVENOUS
  Administered 2012-10-01: 03:00:00 via INTRAVENOUS

## 2012-09-30 MED ORDER — GLYCOPYRROLATE 0.2 MG/ML IJ SOLN
INTRAMUSCULAR | Status: DC | PRN
  Administered 2012-09-30: 0.2 mg via INTRAVENOUS

## 2012-09-30 MED ORDER — EPHEDRINE SULFATE 50 MG/ML IJ SOLN
INTRAMUSCULAR | Status: DC | PRN
  Administered 2012-09-30 (×2): 10 mg via INTRAVENOUS
  Administered 2012-09-30: 5 mg via INTRAVENOUS
  Administered 2012-09-30: 10 mg via INTRAVENOUS

## 2012-09-30 MED ORDER — PROPOFOL 10 MG/ML IV BOLUS
INTRAVENOUS | Status: DC | PRN
  Administered 2012-09-30: 20 mg via INTRAVENOUS
  Administered 2012-09-30: 140 mg via INTRAVENOUS

## 2012-09-30 MED ORDER — ACETAMINOPHEN 10 MG/ML IV SOLN
INTRAVENOUS | Status: DC | PRN
  Administered 2012-09-30: 1000 mg via INTRAVENOUS

## 2012-09-30 MED ORDER — OXYCODONE-ACETAMINOPHEN 5-325 MG PO TABS
1.0000 | ORAL_TABLET | ORAL | Status: DC | PRN
  Administered 2012-09-30 – 2012-10-01 (×4): 2 via ORAL

## 2012-09-30 MED ORDER — POLYETHYLENE GLYCOL 3350 17 G PO PACK: 17.0000 g | PACK | Freq: Every day | ORAL | Status: DC

## 2012-09-30 MED ORDER — HEPARIN SODIUM (PORCINE) 5000 UNIT/ML IJ SOLN: 5000.0000 [IU] | Freq: Three times a day (TID) | INTRAMUSCULAR | Status: DC

## 2012-09-30 MED ORDER — LACTATED RINGERS IV SOLN
INTRAVENOUS | Status: DC
  Administered 2012-09-30: 20 mL/h via INTRAVENOUS
  Administered 2012-09-30 (×2): via INTRAVENOUS

## 2012-09-30 MED ORDER — CHLORHEXIDINE GLUCONATE 4 % EX LIQD: 1.0000 "application " | Freq: Once | CUTANEOUS | Status: DC

## 2012-09-30 MED ORDER — CEFAZOLIN SODIUM-DEXTROSE 2-3 GM-% IV SOLR
2.0000 g | INTRAVENOUS | Status: AC
  Administered 2012-09-30: 2 g via INTRAVENOUS

## 2012-09-30 MED ORDER — ONDANSETRON HCL 4 MG/2ML IJ SOLN
INTRAMUSCULAR | Status: DC | PRN
  Administered 2012-09-30: 4 mg via INTRAVENOUS

## 2012-09-30 MED ORDER — FENTANYL CITRATE 0.05 MG/ML IJ SOLN: 50.0000 ug | INTRAMUSCULAR | Status: DC | PRN

## 2012-09-30 MED ORDER — LIDOCAINE HCL (CARDIAC) 20 MG/ML IV SOLN
INTRAVENOUS | Status: DC | PRN
  Administered 2012-09-30: 60 mg via INTRAVENOUS

## 2012-09-30 MED ORDER — OXYCODONE HCL 5 MG PO TABS: 5.0000 mg | ORAL_TABLET | Freq: Once | ORAL | Status: AC | PRN

## 2012-09-30 MED ORDER — MIDAZOLAM HCL 5 MG/5ML IJ SOLN
INTRAMUSCULAR | Status: DC | PRN
  Administered 2012-09-30: 1 mg via INTRAVENOUS

## 2012-09-30 MED ORDER — FENTANYL CITRATE 0.05 MG/ML IJ SOLN
INTRAMUSCULAR | Status: DC | PRN
  Administered 2012-09-30: 50 ug via INTRAVENOUS

## 2012-09-30 MED ORDER — MIDAZOLAM HCL 2 MG/2ML IJ SOLN: 1.0000 mg | INTRAMUSCULAR | Status: DC | PRN

## 2012-09-30 MED ORDER — OXYCODONE HCL 5 MG/5ML PO SOLN: 5.0000 mg | Freq: Once | ORAL | Status: AC | PRN

## 2012-09-30 MED ORDER — MORPHINE SULFATE 2 MG/ML IJ SOLN: 2.0000 mg | INTRAMUSCULAR | Status: DC | PRN

## 2012-09-30 MED ORDER — DEXAMETHASONE SODIUM PHOSPHATE 4 MG/ML IJ SOLN
INTRAMUSCULAR | Status: DC | PRN
  Administered 2012-09-30: 10 mg via INTRAVENOUS

## 2012-09-30 MED ORDER — MEPERIDINE HCL 25 MG/ML IJ SOLN: 6.2500 mg | INTRAMUSCULAR | Status: DC | PRN

## 2012-09-30 SURGICAL SUPPLY — 69 items
APPLIER CLIP 11 MED OPEN (CLIP)
APPLIER CLIP 9.375 MED OPEN (MISCELLANEOUS) ×2
BANDAGE ELASTIC 6 VELCRO ST LF (GAUZE/BANDAGES/DRESSINGS) IMPLANT
BENZOIN TINCTURE PRP APPL 2/3 (GAUZE/BANDAGES/DRESSINGS) IMPLANT
BINDER BREAST MEDIUM (GAUZE/BANDAGES/DRESSINGS) ×2 IMPLANT
BIOPATCH RED 1 DISK 7.0 (GAUZE/BANDAGES/DRESSINGS) ×2 IMPLANT
BLADE HEX COATED 2.75 (ELECTRODE) ×2 IMPLANT
BLADE SURG 10 STRL SS (BLADE) ×2 IMPLANT
BLADE SURG 15 STRL LF DISP TIS (BLADE) ×2 IMPLANT
BLADE SURG 15 STRL SS (BLADE) ×2
BLADE SURG ROTATE 9660 (MISCELLANEOUS) IMPLANT
CANISTER SUCTION 1200CC (MISCELLANEOUS) IMPLANT
CHLORAPREP W/TINT 26ML (MISCELLANEOUS) ×2 IMPLANT
CLIP APPLIE 11 MED OPEN (CLIP) IMPLANT
CLIP APPLIE 9.375 MED OPEN (MISCELLANEOUS) ×1 IMPLANT
CLOTH BEACON ORANGE TIMEOUT ST (SAFETY) ×2 IMPLANT
COVER MAYO STAND STRL (DRAPES) ×2 IMPLANT
COVER PROBE W GEL 5X96 (DRAPES) ×2 IMPLANT
COVER TABLE BACK 60X90 (DRAPES) ×2 IMPLANT
DECANTER SPIKE VIAL GLASS SM (MISCELLANEOUS) IMPLANT
DERMABOND ADVANCED (GAUZE/BANDAGES/DRESSINGS) ×2
DERMABOND ADVANCED .7 DNX12 (GAUZE/BANDAGES/DRESSINGS) ×2 IMPLANT
DRAIN CHANNEL 19F RND (DRAIN) ×2 IMPLANT
DRAIN HEMOVAC 1/8 X 5 (WOUND CARE) IMPLANT
DRAPE LAPAROSCOPIC ABDOMINAL (DRAPES) ×2 IMPLANT
DRAPE SURG 17X23 STRL (DRAPES) ×2 IMPLANT
DRAPE U-SHAPE 76X120 STRL (DRAPES) IMPLANT
DRAPE UTILITY XL STRL (DRAPES) ×2 IMPLANT
DRSG PAD ABDOMINAL 8X10 ST (GAUZE/BANDAGES/DRESSINGS) ×4 IMPLANT
DRSG TEGADERM 2-3/8X2-3/4 SM (GAUZE/BANDAGES/DRESSINGS) ×2 IMPLANT
DRSG TEGADERM 4X10 (GAUZE/BANDAGES/DRESSINGS) IMPLANT
ELECT BLADE 4.0 EZ CLEAN MEGAD (MISCELLANEOUS) ×2
ELECT REM PT RETURN 9FT ADLT (ELECTROSURGICAL) ×2
ELECTRODE BLDE 4.0 EZ CLN MEGD (MISCELLANEOUS) ×1 IMPLANT
ELECTRODE REM PT RTRN 9FT ADLT (ELECTROSURGICAL) ×1 IMPLANT
EVACUATOR SILICONE 100CC (DRAIN) ×2 IMPLANT
GAUZE SPONGE 4X4 12PLY STRL LF (GAUZE/BANDAGES/DRESSINGS) IMPLANT
GLOVE BIO SURGEON STRL SZ 6.5 (GLOVE) ×2 IMPLANT
GLOVE BIO SURGEON STRL SZ7 (GLOVE) ×4 IMPLANT
GLOVE EUDERMIC 7 POWDERFREE (GLOVE) ×2 IMPLANT
GOWN PREVENTION PLUS XLARGE (GOWN DISPOSABLE) ×4 IMPLANT
GOWN PREVENTION PLUS XXLARGE (GOWN DISPOSABLE) ×4 IMPLANT
NDL SAFETY ECLIPSE 18X1.5 (NEEDLE) ×1 IMPLANT
NEEDLE HYPO 18GX1.5 SHARP (NEEDLE) ×1
NEEDLE HYPO 25X1 1.5 SAFETY (NEEDLE) IMPLANT
NS IRRIG 1000ML POUR BTL (IV SOLUTION) ×2 IMPLANT
PACK BASIN DAY SURGERY FS (CUSTOM PROCEDURE TRAY) ×2 IMPLANT
PEN SKIN MARKING BROAD TIP (MISCELLANEOUS) ×2 IMPLANT
PENCIL BUTTON HOLSTER BLD 10FT (ELECTRODE) ×2 IMPLANT
PIN SAFETY STERILE (MISCELLANEOUS) ×2 IMPLANT
SHEET MEDIUM DRAPE 40X70 STRL (DRAPES) IMPLANT
SLEEVE SCD COMPRESS KNEE MED (MISCELLANEOUS) ×2 IMPLANT
SPONGE LAP 18X18 X RAY DECT (DISPOSABLE) ×4 IMPLANT
SPONGE LAP 4X18 X RAY DECT (DISPOSABLE) IMPLANT
STAPLER VISISTAT 35W (STAPLE) IMPLANT
STRIP CLOSURE SKIN 1/2X4 (GAUZE/BANDAGES/DRESSINGS) IMPLANT
SUT ETHILON 2 0 FS 18 (SUTURE) IMPLANT
SUT MNCRL AB 3-0 PS2 18 (SUTURE) ×8 IMPLANT
SUT MNCRL AB 4-0 PS2 18 (SUTURE) IMPLANT
SUT SILK 2 0 SH (SUTURE) IMPLANT
SUT VIC AB 3-0 SH 27 (SUTURE) ×1
SUT VIC AB 3-0 SH 27X BRD (SUTURE) ×1 IMPLANT
SUT VICRYL 3-0 CR8 SH (SUTURE) ×2 IMPLANT
SYR CONTROL 10ML LL (SYRINGE) IMPLANT
TOWEL OR 17X24 6PK STRL BLUE (TOWEL DISPOSABLE) ×4 IMPLANT
TOWEL OR NON WOVEN STRL DISP B (DISPOSABLE) ×2 IMPLANT
TUBE CONNECTING 20X1/4 (TUBING) ×2 IMPLANT
WATER STERILE IRR 1000ML POUR (IV SOLUTION) IMPLANT
YANKAUER SUCT BULB TIP NO VENT (SUCTIONS) ×2 IMPLANT

## 2012-09-30 NOTE — Transfer of Care (Signed)
Immediate Anesthesia Transfer of Care Note  Patient: Kelly [name]  Procedure(s) Performed: Procedure(s) with comments: SIMPLE MASTECTOMY (Left) - left total mastectomy  Patient Location: PACU  Anesthesia Type:General  Level of Consciousness: sedated and responds to stimulation  Airway & Oxygen Therapy: Patient Spontanous Breathing and Patient connected to face mask oxygen  Post-op Assessment: Report given to PACU RN and Post -op Vital signs reviewed and stable  Post vital signs: Reviewed and stable  Complications: No apparent anesthesia complications

## 2012-09-30 NOTE — Op Note (Signed)
Kelly [name] 12-May-1961 [id number] 09/02/2012  Preoperative diagnosis: Desires prophylactic mastectomy  Postoperative diagnosis: Same  Procedure: Left total mastectomy  Surgeon: Currie Paris  Assistant surgeon: Ralene Muskrat, PA-S  Anesthesia:General  Clinical History and Indications:The patient is seen in the holding area and we reviewed the plans for the procedure as noted above. We reviewed the risks and complications a final time. She had no further questions. I marked theleft side as the operative side.  Description of Procedure: The patient was taken to the operating room. After satisfactory general anesthesia was obtained the breast was prepped and draped and the time out done.  I outlined an elliptical incision and marked the inframammary fold and midline. The incision was made. The usual skin flaps were raised. I went to the clavicle superiorly and sternum medially and towards the latissimus laterally.  .   The inferior flap was then made going to the inframammary fold and out to the latissimus. The breast was then removed from the pectoralis starting medially and working laterally. When I got to the lateral aspect of the pectoralis major muscle I opened the clavipectoral fascia. I finished removing the breast from the serratus muscles.  At this point the pathologist reported on the sentinel node and that it was negative.  I thencompleted the mastectomy by dividing the remaining attachments from the breast to the chest wall and latissimus but not taking any tissue from the axilla. The specimen was marked to orient it for the pathologist and passed off the table.  I spent several minutes irrigating and making sure everything was dry. I then placed a 54 [place] Blake drain through a small incision in the inferior flap and laid along the pectoralis muscle. It was secured with a 2-0 nylon suture.  Another irrigation and check for hemostasis was made. Everything appeared to  be dry. Incision was then closed with 3-0 Monocryl and Dermabond  The patient tolerated the procedure well. There were no operative complications. All counts were correct. Estimated blood loss was Mininmal. Sterile dressings were applied and the patient taken to the PACU in satisfactory condition.  Currie Paris, MD, FACS 09/30/2012 11:54 AM

## 2012-09-30 NOTE — Progress Notes (Signed)
[facility] providers in with patient prior to pre op

## 2012-09-30 NOTE — Anesthesia Postprocedure Evaluation (Signed)
  Anesthesia Post-op Note  Patient: Kelly [name]  Procedure(s) Performed: Procedure(s) with comments: SIMPLE MASTECTOMY (Left) - left total mastectomy  Patient Location: PACU  Anesthesia Type:General  Level of Consciousness: awake and alert   Airway and Oxygen Therapy: Patient Spontanous Breathing and Patient connected to face mask oxygen  Post-op Pain: mild  Post-op Assessment: Post-op Vital signs reviewed, Patient's Cardiovascular Status Stable, Respiratory Function Stable and Patent Airway  Post-op Vital Signs: Reviewed and stable  Complications: No apparent anesthesia complications

## 2012-09-30 NOTE — Anesthesia Procedure Notes (Signed)
Procedure Name: LMA Insertion Date/Time: 09/30/2012 10:26 AM Performed by: Meyer Russel Pre-anesthesia Checklist: Patient identified, Emergency Drugs available, Suction available and Patient being monitored Patient Re-evaluated:Patient Re-evaluated prior to inductionOxygen Delivery Method: Circle System Utilized Preoxygenation: Pre-oxygenation with 100% oxygen Intubation Type: IV induction Ventilation: Mask ventilation without difficulty LMA: LMA inserted LMA Size: 3.0 Number of attempts: 1 Airway Equipment and Method: bite block Placement Confirmation: positive ETCO2 and breath sounds checked- equal and bilateral Tube secured with: Tape Dental Injury: Teeth and Oropharynx as per pre-operative assessment

## 2012-09-30 NOTE — Anesthesia Preprocedure Evaluation (Addendum)
Anesthesia Evaluation  Patient identified by MRN, date of birth, ID band Patient awake    Reviewed: Allergy & Precautions, H&P , NPO status , Patient's Chart, lab work & pertinent test results  History of Anesthesia Complications Negative for: history of anesthetic complications  Airway Mallampati: I      Dental no notable dental hx. (+) Teeth Intact   Pulmonary neg pulmonary ROS,  breath sounds clear to auscultation  Pulmonary exam normal       Cardiovascular negative cardio ROS  IRhythm:regular Rate:Normal     Neuro/Psych Anxiety Depression negative neurological ROS  negative psych ROS   GI/Hepatic negative GI ROS, Neg liver ROS,   Endo/Other  negative endocrine ROS  Renal/GU negative Renal ROS  negative genitourinary   Musculoskeletal   Abdominal   Peds  Hematology negative hematology ROS (+)   Anesthesia Other Findings   Reproductive/Obstetrics negative OB ROS                           Anesthesia Physical Anesthesia Plan  ASA: I  Anesthesia Plan: General   Post-op Pain Management:    Induction:   Airway Management Planned:   Additional Equipment:   Intra-op Plan:   Post-operative Plan:   Informed Consent: I have reviewed the patients History and Physical, chart, labs and discussed the procedure including the risks, benefits and alternatives for the proposed anesthesia with the patient or authorized representative who has indicated his/her understanding and acceptance.     Plan Discussed with: CRNA and Surgeon  Anesthesia Plan Comments:         Anesthesia Quick Evaluation

## 2012-09-30 NOTE — H&P (View-Only) (Signed)
CC: Desires prophylactic left mastectomy  HPI: Patient recently finished radiation following right mastectomy for multi-focal right breast cancer. She has declined chemo. At dx she had some suspicious clacs n the left breast but the MRI was negative. She wishes a left mastectomy and is tentatively scheduled for that. PFSH: See prior notes and Epic  Exam: Vital:BP 118/65  Pulse 65  Temp(Src) 98.4 F (36.9 C) (Temporal)  Resp 12  Ht 5' 4" (1.626 m)  Wt 116 lb 6.4 oz (52.799 kg)  BMI 19.97 kg/m2  LMP 08/03/2012 VITAL SIGNS: BP 118/65  Pulse 65  Temp(Src) 98.4 F (36.9 C) (Temporal)  Resp 12  Ht 5' 4" (1.626 m)  Wt 116 lb 6.4 oz (52.799 kg)  BMI 19.97 kg/m2  LMP 08/03/2012 GENERAL:  The patient is alert, oriented, and generally healthy-appearing, NAD. Mood and affect are normal.  HEENT:  The head is normocephalic, the eyes nonicteric, the pupils were round regular and equal. EOMs are normal. Pharynx normal. Dentition good.  NECK:  The neck is supple and there are no masses or thyromegaly.  LUNGS: Normal respirations and clear to auscultation.  HEART: Regular rhythm, with no murmurs rubs or gallops. Pulses are intact carotid dorsalis pedis and posterior tibial. No significant varicosities are noted.  BREASTS: Right is surgically absent with recent radiation. Left is normal  ABDOMEN: Soft, flat, and nontender. No masses or organomegaly is noted. No hernias are noted. Bowel sounds are normal.  EXTREMITIES:  Good range of motion, no edema.  IMP: Right breast cancer, S/P mastectomy, radiation Calcs, left breast, prob benign based on MRI  Rec. Long talk with her and husband. Explained that no long term improved survival with prophylactic mastectomy, but if no surgery would re-evaluate the calcs for bx. She is interested in reconstruction and might want to do that at the time of mastectomy. She knows she will need a delay on the right to allow recovery from the radiation. She  could have left mastectomy at time of reconstruction on the right, have mastectomy and delayed reconstruction, or mastectomy and reconstruction with the right being done later. If we do immediate reconstruction she might be candidate for nipple sparing.  We will arrange a plastic surgery consult to help her decide.    

## 2012-09-30 NOTE — Interval H&P Note (Signed)
History and Physical Interval Note:  09/30/2012 9:53 AM  Kelly [name]  has presented today for surgery, with the diagnosis of right breast cancer prophylactic mastectomy  The various methods of treatment have been discussed with the patient and family. After consideration of risks, benefits and other options for treatment, the patient has consented to  Procedure(s) with comments: SIMPLE MASTECTOMY (Left) - left total mastectomy as a surgical intervention .  The patient's history has been reviewed, patient examined, no change in status, stable for surgery.  I have reviewed the patient's chart and labs.  Questions were answered to the patient's satisfaction. The patient elected no immediate reconstruction. I marked the left breast as the operative site    STRECK,CHRISTIAN J

## 2012-10-01 ENCOUNTER — Encounter: Payer: Self-pay | Admitting: Genetic Counselor

## 2012-10-01 ENCOUNTER — Ambulatory Visit: Payer: BC Managed Care – PPO | Attending: Radiation Oncology | Admitting: Physical Therapy

## 2012-10-01 ENCOUNTER — Encounter (HOSPITAL_BASED_OUTPATIENT_CLINIC_OR_DEPARTMENT_OTHER): Payer: Self-pay | Admitting: Surgery

## 2012-10-01 DIAGNOSIS — M24519 Contracture, unspecified shoulder: Secondary | ICD-10-CM | POA: Insufficient documentation

## 2012-10-01 DIAGNOSIS — M25519 Pain in unspecified shoulder: Secondary | ICD-10-CM | POA: Insufficient documentation

## 2012-10-01 DIAGNOSIS — M6281 Muscle weakness (generalized): Secondary | ICD-10-CM | POA: Insufficient documentation

## 2012-10-01 DIAGNOSIS — IMO0001 Reserved for inherently not codable concepts without codable children: Secondary | ICD-10-CM | POA: Insufficient documentation

## 2012-10-01 MED ORDER — OXYCODONE-ACETAMINOPHEN 5-325 MG PO TABS: 1.0000 | ORAL_TABLET | ORAL | Status: DC | PRN

## 2012-10-01 NOTE — Progress Notes (Signed)
1 Day Post-Op   Assessment: s/p Procedure(s): SIMPLE MASTECTOMY Patient Active Problem List  Diagnosis  . Right breast cancer, IDC, multicentric    Doing well  Plan: Discharge  Subjective: Feels OK, pain control good  Objective: Vital signs in last 24 hours: Temp:  [97.6 F (36.4 C)-98.5 F (36.9 C)] 98 F (36.7 C) (03/06 1827) Pulse Rate:  [56-92] 58 (03/07 0200) Resp:  [9-18] 16 (03/07 0200) BP: (84-105)/(42-67) 84/48 mmHg (03/07 0200) SpO2:  [92 %-100 %] 97 % (03/07 0200) Weight:  [114 lb 2 oz (51.767 kg)] 114 lb 2 oz (51.767 kg) (03/06 0932)   Intake/Output from previous day: 03/06 0701 - 03/07 0700 In: 2570 [P.O.:720; I.V.:1850] Out: 2205 [Urine:2150; Drains:55]  General appearance: alert, cooperative and no distress Resp: clear to auscultation bilaterally  Incision: healing well, JP minimal, no hematoma  Lab Results:  No results found for this basename: WBC, HGB, HCT, PLT,  in the last 72 hours BMET No results found for this basename: NA, K, CL, CO2, GLUCOSE, BUN, CREATININE, CALCIUM,  in the last 72 hours  MEDS, Scheduled . heparin  5,000 Units Subcutaneous Q8H  . polyethylene glycol  17 g Oral Daily    Studies/Results: No results found.    LOS: 1 day     Currie Paris, MD, [facility], [place] [phone]   10/01/2012 7:29 AM     Doing well

## 2012-10-04 ENCOUNTER — Telehealth (INDEPENDENT_AMBULATORY_CARE_PROVIDER_SITE_OTHER): Payer: Self-pay

## 2012-10-04 NOTE — Telephone Encounter (Signed)
Called pt and notified her of the path report to be benign per Dr Jamey Ripa these were benign calcifications. The pt understands.

## 2012-10-07 ENCOUNTER — Telehealth: Payer: Self-pay | Admitting: Oncology

## 2012-10-07 NOTE — Telephone Encounter (Signed)
PT called to r/s appt to 04/03 @ 3:45 pt confirmed appt.

## 2012-10-08 ENCOUNTER — Ambulatory Visit (INDEPENDENT_AMBULATORY_CARE_PROVIDER_SITE_OTHER): Payer: BC Managed Care – PPO

## 2012-10-08 ENCOUNTER — Encounter (INDEPENDENT_AMBULATORY_CARE_PROVIDER_SITE_OTHER): Payer: Self-pay

## 2012-10-08 VITALS — BP 118/62 | HR 86 | Temp 97.7°F | Resp 18 | Ht 65.0 in | Wt 115.0 lb

## 2012-10-08 DIAGNOSIS — Z4803 Encounter for change or removal of drains: Secondary | ICD-10-CM

## 2012-10-08 NOTE — Progress Notes (Signed)
Pt arrived to in good spirits accompanied by a close friend. Jp drain in place with  light yellow drainage noted. No redness, Denies discomfort ,fever. Cleansed area with cholra prep ; Removed JP drain ; applied 4x4 dressing; secured with tape; Patient tolerated well. Advised patient to call if any changes in condition, fever, redness around the site, large amt of drainage, swelling , pain. She has a follow up visit in one week with DR. Streck

## 2012-10-11 ENCOUNTER — Ambulatory Visit: Payer: BC Managed Care – PPO | Admitting: Radiation Oncology

## 2012-10-11 ENCOUNTER — Telehealth: Payer: Self-pay | Admitting: *Deleted

## 2012-10-11 NOTE — Telephone Encounter (Signed)
Received phone call from patient, stating that she wants to cancel today's appt. Due to the weather, she wants to come tomorrow to see Dr. Roselind Messier for this visit, spoke with Dr. Roselind Messier and he has agreed to see her, she will be here tomorrow at 11:30 a.m., patient understands that she possibly might have to wait due to it being his under treat day, Nurse Lelon Mast is aware of this

## 2012-10-12 ENCOUNTER — Ambulatory Visit
Admission: RE | Admit: 2012-10-12 | Discharge: 2012-10-12 | Disposition: A | Discharge status: Home / Self Care | Payer: BC Managed Care – PPO | Source: Ambulatory Visit | Attending: Radiation Oncology | Admitting: Radiation Oncology

## 2012-10-12 ENCOUNTER — Encounter: Payer: Self-pay | Admitting: Radiation Oncology

## 2012-10-12 VITALS — BP 112/77 | HR 81 | Temp 98.3°F | Resp 20 | Wt 114.9 lb

## 2012-10-12 DIAGNOSIS — C50911 Malignant neoplasm of unspecified site of right female breast: Secondary | ICD-10-CM

## 2012-10-12 NOTE — Progress Notes (Signed)
Patient here f/u s/p rad tx right breast 07/14/12-09/03/12, well healed, slight erythema and tightness under axilla, alert,otiented x3, she had a left mastectomy 09/30/12, well healing, bandage  Dry under left chest  Wall where j/p drain was,it pulled last Friday,patient says still drainage, started tamoxifen 20mg  po daily took first pill last night stated patient, no nausea, some fatigue 11:48 AM

## 2012-10-12 NOTE — Progress Notes (Signed)
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  She seems to be doing reasonably well. She did undergo a prophylactic left simple mastectomy earlier this month. There were no signs of malignancy or atypia in the left breast. Patient does wear a lymphedema sleeve along the right arm. She is accompanied by her husband on evaluation today. She denies any cough or breathing problems.   she denies any pain or itching along the right chest wall area.                              ALLERGIES:  has No Known Allergies.  Meds: Current Outpatient Prescriptions  Medication Sig Dispense Refill  . buPROPion (WELLBUTRIN XL) 150 MG 24 hr tablet Take 150 mg by mouth daily.       Tery Sanfilippo Calcium (STOOL SOFTENER PO) Take by mouth as needed.       Marland Kitchen LORazepam (ATIVAN) 0.5 MG tablet Take 0.5 mg by mouth every 8 (eight) hours as needed.      Marland Kitchen oxyCODONE-acetaminophen (PERCOCET/ROXICET) 5-325 MG per tablet Take 1-2 tablets by mouth every 4 (four) hours as needed.  30 tablet  0  . tamoxifen (NOLVADEX) 20 MG tablet Take 20 mg by mouth daily.      . traZODone (DESYREL) 50 MG tablet Take 150 mg by mouth at bedtime. scheduled      . VIIBRYD 40 MG TABS Take 40 mg by mouth daily.       . benzonatate (TESSALON) 100 MG capsule       . non-metallic deodorant (ALRA) MISC Apply 1 application topically daily. Apply after rad tx daily and prn      . polyethylene glycol powder (MIRALAX) powder Take 17 g by mouth daily.  255 g  0  . prochlorperazine (COMPAZINE) 10 MG tablet Take 1 tablet (10 mg total) by mouth every 6 (six) hours as needed.   30 tablet  0  . promethazine (PHENERGAN) 25 MG tablet Take 1 tablet (25 mg total) by mouth every 6 (six) hours as needed for nausea.  30 tablet  0   No current facility-administered medications for this encounter.    Physical Findings: The patient is in no acute distress. Patient is alert and oriented.  weight is 114 lb 14.4 oz (52.118 kg). Her oral temperature is 98.3 F (36.8 C). Her blood pressure is 112/77 and her pulse is 81. Her respiration is 20. Marland Kitchen  No palpable supraclavicular or axillary adenopathy. The lungs are clear. The heart has a regular rhythm and rate. The right chest wall area shows some hyperpigmentation changes. There is no palpable or visible signs recurrence. The left chest wall area is healing nicely.  Lab Findings: Lab Results  Component Value Date   WBC 4.7 08/24/2012   HGB 13.9 08/24/2012   HCT 41.1 08/24/2012   MCV 89.0 08/24/2012   PLT 152 08/24/2012     Impression:  The patient is recovering from the effects of radiation.  No signs of recurrence on clinical exam today.  Plan:  Routine followup in 6 months.  _____________________________________  -----------------------------------  Billie Lade, PhD, MD

## 2012-10-15 ENCOUNTER — Ambulatory Visit (INDEPENDENT_AMBULATORY_CARE_PROVIDER_SITE_OTHER): Payer: BC Managed Care – PPO | Admitting: Surgery

## 2012-10-15 ENCOUNTER — Encounter (INDEPENDENT_AMBULATORY_CARE_PROVIDER_SITE_OTHER): Payer: Self-pay | Admitting: Surgery

## 2012-10-15 VITALS — BP 104/62 | HR 64 | Temp 97.8°F | Resp 14 | Ht 64.5 in | Wt 116.8 lb

## 2012-10-15 DIAGNOSIS — Z09 Encounter for follow-up examination after completed treatment for conditions other than malignant neoplasm: Secondary | ICD-10-CM

## 2012-10-15 NOTE — Progress Notes (Signed)
Patient in for followup post left mastectomy. Her drains removed last week. She's had a little bit more discomfort on the left side than the right but otherwise doing okay.  On exam the patient does have a small stromal on the left side. I aspirated out and retrieved 15 cc of clear fluid  Impression doing well but small seroma  Plan: I'll see back next week.

## 2012-10-18 ENCOUNTER — Encounter (HOSPITAL_COMMUNITY): Payer: Self-pay | Admitting: Emergency Medicine

## 2012-10-18 ENCOUNTER — Inpatient Hospital Stay (HOSPITAL_COMMUNITY)
Admission: EM | Admit: 2012-10-18 | Discharge: 2012-10-22 | DRG: 418 | Disposition: A | Discharge status: Home / Self Care | Payer: BC Managed Care – PPO | Attending: Surgery | Admitting: Surgery

## 2012-10-18 ENCOUNTER — Telehealth (INDEPENDENT_AMBULATORY_CARE_PROVIDER_SITE_OTHER): Payer: Self-pay

## 2012-10-18 ENCOUNTER — Emergency Department (HOSPITAL_COMMUNITY): Payer: BC Managed Care – PPO

## 2012-10-18 DIAGNOSIS — F411 Generalized anxiety disorder: Secondary | ICD-10-CM | POA: Diagnosis present

## 2012-10-18 DIAGNOSIS — N61 Mastitis without abscess: Secondary | ICD-10-CM | POA: Diagnosis present

## 2012-10-18 DIAGNOSIS — A4902 Methicillin resistant Staphylococcus aureus infection, unspecified site: Secondary | ICD-10-CM | POA: Diagnosis present

## 2012-10-18 DIAGNOSIS — Y836 Removal of other organ (partial) (total) as the cause of abnormal reaction of the patient, or of later complication, without mention of misadventure at the time of the procedure: Secondary | ICD-10-CM | POA: Diagnosis present

## 2012-10-18 DIAGNOSIS — IMO0002 Reserved for concepts with insufficient information to code with codable children: Principal | ICD-10-CM

## 2012-10-18 DIAGNOSIS — Y92009 Unspecified place in unspecified non-institutional (private) residence as the place of occurrence of the external cause: Secondary | ICD-10-CM

## 2012-10-18 DIAGNOSIS — F329 Major depressive disorder, single episode, unspecified: Secondary | ICD-10-CM | POA: Diagnosis present

## 2012-10-18 DIAGNOSIS — I959 Hypotension, unspecified: Secondary | ICD-10-CM | POA: Diagnosis present

## 2012-10-18 DIAGNOSIS — Z853 Personal history of malignant neoplasm of breast: Secondary | ICD-10-CM

## 2012-10-18 DIAGNOSIS — F3289 Other specified depressive episodes: Secondary | ICD-10-CM | POA: Diagnosis present

## 2012-10-18 DIAGNOSIS — Z803 Family history of malignant neoplasm of breast: Secondary | ICD-10-CM

## 2012-10-18 DIAGNOSIS — IMO0001 Reserved for inherently not codable concepts without codable children: Secondary | ICD-10-CM

## 2012-10-18 DIAGNOSIS — Z9660 Presence of unspecified orthopedic joint implant: Secondary | ICD-10-CM

## 2012-10-18 DIAGNOSIS — T792XXD Traumatic secondary and recurrent hemorrhage and seroma, subsequent encounter: Secondary | ICD-10-CM

## 2012-10-18 DIAGNOSIS — Z9010 Acquired absence of unspecified breast and nipple: Secondary | ICD-10-CM

## 2012-10-18 HISTORY — DX: Unspecified osteoarthritis, unspecified site: M19.90

## 2012-10-18 LAB — CBC WITH DIFFERENTIAL/PLATELET
Basophils Absolute: 0 10*3/uL (ref 0.0–0.1)
Basophils Relative: 0 % (ref 0–1)
Eosinophils Absolute: 0 10*3/uL (ref 0.0–0.7)
Eosinophils Relative: 0 % (ref 0–5)
HCT: 36.2 % (ref 36.0–46.0)
Hemoglobin: 12.8 g/dL (ref 12.0–15.0)
Lymphocytes Relative: 2 % — ABNORMAL LOW (ref 12–46)
Lymphs Abs: 0.3 10*3/uL — ABNORMAL LOW (ref 0.7–4.0)
MCH: 31.1 pg (ref 26.0–34.0)
MCHC: 35.4 g/dL (ref 30.0–36.0)
MCV: 88.1 fL (ref 78.0–100.0)
Monocytes Absolute: 0.9 10*3/uL (ref 0.1–1.0)
Monocytes Relative: 6 % (ref 3–12)
Neutro Abs: 13.1 10*3/uL — ABNORMAL HIGH (ref 1.7–7.7)
Neutrophils Relative %: 91 % — ABNORMAL HIGH (ref 43–77)
Platelets: 181 10*3/uL (ref 150–400)
RBC: 4.11 MIL/uL (ref 3.87–5.11)
RDW: 13.7 % (ref 11.5–15.5)
WBC: 14.4 10*3/uL — ABNORMAL HIGH (ref 4.0–10.5)

## 2012-10-18 LAB — URINALYSIS, ROUTINE W REFLEX MICROSCOPIC
Bilirubin Urine: NEGATIVE
Glucose, UA: NEGATIVE mg/dL
Hgb urine dipstick: NEGATIVE
Ketones, ur: NEGATIVE mg/dL
Leukocytes, UA: NEGATIVE
Nitrite: NEGATIVE
Protein, ur: NEGATIVE mg/dL
Specific Gravity, Urine: 1.011 (ref 1.005–1.030)
Urobilinogen, UA: 0.2 mg/dL (ref 0.0–1.0)
pH: 7.5 (ref 5.0–8.0)

## 2012-10-18 LAB — COMPREHENSIVE METABOLIC PANEL
ALT: 25 U/L (ref 0–35)
AST: 37 U/L (ref 0–37)
Albumin: 3.6 g/dL (ref 3.5–5.2)
Alkaline Phosphatase: 87 U/L (ref 39–117)
BUN: 14 mg/dL (ref 6–23)
CO2: 29 mEq/L (ref 19–32)
Calcium: 9.3 mg/dL (ref 8.4–10.5)
Chloride: 99 mEq/L (ref 96–112)
Creatinine, Ser: 0.86 mg/dL (ref 0.50–1.10)
GFR calc Af Amer: 89 mL/min — ABNORMAL LOW (ref >90)
GFR calc non Af Amer: 77 mL/min — ABNORMAL LOW (ref >90)
Glucose, Bld: 93 mg/dL (ref 70–99)
Potassium: 3.7 mEq/L (ref 3.5–5.1)
Sodium: 137 mEq/L (ref 135–145)
Total Bilirubin: 1 mg/dL (ref 0.3–1.2)
Total Protein: 6.8 g/dL (ref 6.0–8.3)

## 2012-10-18 LAB — CG4 I-STAT (LACTIC ACID): Lactic Acid, Venous: 1.34 mmol/L (ref 0.5–2.2)

## 2012-10-18 MED ORDER — ACETAMINOPHEN 325 MG PO TABS
650.0000 mg | ORAL_TABLET | Freq: Four times a day (QID) | ORAL | Status: DC | PRN
  Administered 2012-10-18 – 2012-10-20 (×3): 650 mg via ORAL
  Filled 2012-10-18 (×3): qty 2

## 2012-10-18 MED ORDER — SODIUM CHLORIDE 0.9 % IV BOLUS (SEPSIS)
1000.0000 mL | Freq: Once | INTRAVENOUS | Status: AC
  Administered 2012-10-18: 1000 mL via INTRAVENOUS

## 2012-10-18 NOTE — ED Provider Notes (Signed)
History     CSN: [id number]  Arrival date & time 10/18/12  7829   First MD Initiated Contact with Patient 10/18/12 2257      Chief Complaint  Patient presents with  . Wound Infection    (Consider location/radiation/quality/duration/timing/severity/associated sxs/prior treatment) HPI This 52 year old female who had a right mastectomy in November due to to breast cancer. She subsequently underwent radiation therapy. She had a prophylactic left mastectomy on the sixth of this month. For the past week she has had increasing pain, swelling and erythema at the incision site. This became worse today, accompanied by general malaise, fatigue, lightheadedness, fever and headache. The symptoms are moderate to severe. Pain is worse with palpation. She is noted to have a temperature 102.3 at this time with a systolic blood pressure of 83.  Past Medical History  Diagnosis Date  . Anxiety   . Depression   . Breast cancer 05/05/12    right, ER +, PR -, Her 2 -  . Arthritis     Past Surgical History  Procedure Laterality Date  . Cesarean section      x3  . Joint replacement      Rt and Lt thumbs  . Cervical conization w/bx    . Breast surgery      several biopsies, needle aspiration  . Mastectomy w/ sentinel node biopsy  06/07/2012    Procedure: MASTECTOMY WITH SENTINEL LYMPH NODE BIOPSY;  Surgeon: Currie Paris, MD;  Location: [facility] OR;  Service: General;  Laterality: Right;  right total mastectomy and sentinel node  . Mastectomy modified radical  06/07/2012    Procedure: MASTECTOMY MODIFIED RADICAL;  Surgeon: Currie Paris, MD;  Location: [facility] OR;  Service: General;  Laterality: Right;  . Tubal ligation    . Simple mastectomy with axillary sentinel node biopsy Left 09/30/2012    Procedure: SIMPLE MASTECTOMY;  Surgeon: Currie Paris, MD;  Location: [facility];  Service: General;  Laterality: Left;  left total mastectomy    Family History  Problem Relation Age of  Onset  . Breast cancer Mother 28  . Lymphoma Mother 1    non-hodgkins lymphoma in her epiglottis  . Cancer Mother     non-hodkins lymphoma, breast  . Stroke Maternal Grandfather   . Bipolar disorder Brother   . Gout Brother   . Breast cancer Other     MGM's sister; dx <50  . Breast cancer Other 40    MGF's sister  . Brain cancer Other     MGM's sister; dx <50  . Leukemia Other     mother's maternal cousin; dx under 10    History  Substance Use Topics  . Smoking status: Never Smoker   . Smokeless tobacco: Never Used  . Alcohol Use: Yes     Comment: very rare    OB History   Grav Para Term Preterm Abortions TAB SAB Ect Mult Living                  Review of Systems  All other systems reviewed and are negative.    Allergies  Review of patient's allergies indicates no known allergies.  Home Medications   Current Outpatient Rx  Name  Route  Sig  Dispense  Refill  . buPROPion (WELLBUTRIN XL) 150 MG 24 hr tablet   Oral   Take 150 mg by mouth daily.          . tamoxifen (NOLVADEX) 20 MG tablet  Oral   Take 20 mg by mouth daily.         . traZODone (DESYREL) 50 MG tablet   Oral   Take 150 mg by mouth at bedtime. scheduled         . VIIBRYD 40 MG TABS   Oral   Take 40 mg by mouth daily.            BP 93/43  Pulse 98  Temp(Src) 102.3 F (39.1 C) (Oral)  Resp 14  Ht 5\' 4"  (1.626 m)  Wt 115 lb (52.164 kg)  BMI 19.73 kg/m2  SpO2 95%  LMP 07/28/2012  Physical Exam General: Well-developed, well-nourished female in no acute distress; appearance consistent with age of record HENT: normocephalic, atraumatic Eyes: pupils equal round and reactive to light; extraocular muscles intact Neck: supple Heart: regular rate and rhythm; no murmurs, rubs or gallops Lungs: clear to auscultation bilaterally Chest: Bilateral mastectomy; right mastectomy incision well-healed; left mastectomy incision with fluid collection underlying the lateral aspect under the  left axilla, a large area of erythema, tenderness and warmth surrounding the incision Abdomen: soft; nondistended; nontender; no masses or hepatosplenomegaly; bowel sounds present Extremities: No deformity; full range of motion; pulses normal Neurologic: Awake, alert and oriented; motor function intact in all extremities and symmetric; no facial droop Skin: Warm and dry Psychiatric: Depressed affect    ED Course  Procedures (including critical care time)  CRITICAL CARE Performed by: Ellyce Lafevers L   Total critical care time: 30 minutes  Critical care time was exclusive of separately billable procedures and treating other patients.  Critical care was necessary to treat or prevent imminent or life-threatening deterioration.  Critical care was time spent personally by me on the following activities: development of treatment plan with patient and/or surrogate as well as nursing, discussions with consultants, evaluation of patient's response to treatment, examination of patient, obtaining history from patient or surrogate, ordering and performing treatments and interventions, ordering and review of laboratory studies, ordering and review of radiographic studies, pulse oximetry and re-evaluation of patient's condition.    MDM   Nursing notes and vitals signs, including pulse oximetry, reviewed.  Summary of this visit's results, reviewed by myself:  Labs:  Results for orders placed during the hospital encounter of 10/18/12 (from the past 24 hour(s))  CBC WITH DIFFERENTIAL     Status: Abnormal   Collection Time    10/18/12  7:52 PM      Result Value Range   WBC 14.4 (*) 4.0 - 10.5 K/uL   RBC 4.11  3.87 - 5.11 MIL/uL   Hemoglobin 12.8  12.0 - 15.0 g/dL   HCT 16.1  09.6 - 04.5 %   MCV 88.1  78.0 - 100.0 fL   MCH 31.1  26.0 - 34.0 pg   MCHC 35.4  30.0 - 36.0 g/dL   RDW 40.9  81.1 - 91.4 %   Platelets 181  150 - 400 K/uL   Neutrophils Relative 91 (*) 43 - 77 %   Neutro Abs 13.1 (*)  1.7 - 7.7 K/uL   Lymphocytes Relative 2 (*) 12 - 46 %   Lymphs Abs 0.3 (*) 0.7 - 4.0 K/uL   Monocytes Relative 6  3 - 12 %   Monocytes Absolute 0.9  0.1 - 1.0 K/uL   Eosinophils Relative 0  0 - 5 %   Eosinophils Absolute 0.0  0.0 - 0.7 K/uL   Basophils Relative 0  0 - 1 %   Basophils Absolute  0.0  0.0 - 0.1 K/uL  COMPREHENSIVE METABOLIC PANEL     Status: Abnormal   Collection Time    10/18/12  7:52 PM      Result Value Range   Sodium 137  135 - 145 mEq/L   Potassium 3.7  3.5 - 5.1 mEq/L   Chloride 99  96 - 112 mEq/L   CO2 29  19 - 32 mEq/L   Glucose, Bld 93  70 - 99 mg/dL   BUN 14  6 - 23 mg/dL   Creatinine, Ser 1.61  0.50 - 1.10 mg/dL   Calcium 9.3  8.4 - 09.6 mg/dL   Total Protein 6.8  6.0 - 8.3 g/dL   Albumin 3.6  3.5 - 5.2 g/dL   AST 37  0 - 37 U/L   ALT 25  0 - 35 U/L   Alkaline Phosphatase 87  39 - 117 U/L   Total Bilirubin 1.0  0.3 - 1.2 mg/dL   GFR calc non Af Amer 77 (*) >90 mL/min   GFR calc Af Amer 89 (*) >90 mL/min  CG4 I-STAT (LACTIC ACID)     Status: None   Collection Time    10/18/12  8:25 PM      Result Value Range   Lactic Acid, Venous 1.34  0.5 - 2.2 mmol/L  URINALYSIS, ROUTINE W REFLEX MICROSCOPIC     Status: None   Collection Time    10/18/12  8:35 PM      Result Value Range   Color, Urine YELLOW  YELLOW   APPearance CLEAR  CLEAR   Specific Gravity, Urine 1.011  1.005 - 1.030   pH 7.5  5.0 - 8.0   Glucose, UA NEGATIVE  NEGATIVE mg/dL   Hgb urine dipstick NEGATIVE  NEGATIVE   Bilirubin Urine NEGATIVE  NEGATIVE   Ketones, ur NEGATIVE  NEGATIVE mg/dL   Protein, ur NEGATIVE  NEGATIVE mg/dL   Urobilinogen, UA 0.2  0.0 - 1.0 mg/dL   Nitrite NEGATIVE  NEGATIVE   Leukocytes, UA NEGATIVE  NEGATIVE    Imaging Studies: Dg Chest 2 View  10/18/2012  *RADIOLOGY REPORT*  Clinical Data: Dizziness.  Mastectomy.  Possible wound infection.  CHEST - 2 VIEW  Comparison: 02/27/2011  Findings: Postoperative changes over the chest are noted.  Lungs are hyperaerated  and grossly clear.  Increased density at the left lung base likely reflects overlying soft tissue fullness.  No pneumothorax.  Normal heart size.  IMPRESSION: No active cardiopulmonary disease.  Postoperative and chronic changes are noted.   Original Report Authenticated By: Jolaine Click, M.D.    12:08 AM Blood cultures redrawn to 2 spiking fever. Blood pressure improved with IV fluid bolus. Dr. Andrey Campanile of general surgery consulted and will admit patient. Initial lactate are suggestive of sepsis but repeat lactate drawn given the patient's hypotension. Margins of cellulitis marked on patient.   Hanley Seamen, MD 10/19/12 [phone]

## 2012-10-18 NOTE — ED Notes (Signed)
Mastectomy March 6. Onset 2 days ago of swelling and redness at incision site, worsened today. Scant amount of blood at incision site noted by patient today. Currently skin is light pink to red at left side of chest. Dizziness onset today. Saw surgeon this past Friday for follow-up.

## 2012-10-18 NOTE — Telephone Encounter (Signed)
The pt states the area below her mastectomy incision is swollen and painful.  It is also now red.  She has an appointment  3/28.  She wants it checked asap.  She asked if a nurse could check her and I suggested it would be best for the doctor to see her.  I will check with Jade/ Dr Jamey Ripa if she can be worked in [employer] account [employer].  We will call her back.

## 2012-10-18 NOTE — Telephone Encounter (Signed)
I spoke to Dr Jamey Ripa.  He would like to see the pt tomorrow near the beginning of his office.  She may be getting a seroma.  I called the pt and gave her an appointment for 1:30 tomorrow and asked her to be here at 1:15 to check in.

## 2012-10-18 NOTE — ED Notes (Signed)
Pt ambulatory to restroom

## 2012-10-18 NOTE — ED Notes (Signed)
Reddened area around incision marked with skin marker by Suszanne Conners. Pt also presents with raised area under left axilla, area marked with skin marker by Christianne Borrow RN.

## 2012-10-19 ENCOUNTER — Encounter (INDEPENDENT_AMBULATORY_CARE_PROVIDER_SITE_OTHER): Payer: BC Managed Care – PPO | Admitting: Surgery

## 2012-10-19 LAB — CBC
HCT: 32.3 % — ABNORMAL LOW (ref 36.0–46.0)
Hemoglobin: 11.1 g/dL — ABNORMAL LOW (ref 12.0–15.0)
MCH: 29.9 pg (ref 26.0–34.0)
MCHC: 34.4 g/dL (ref 30.0–36.0)
MCV: 87.1 fL (ref 78.0–100.0)
Platelets: 155 10*3/uL (ref 150–400)
RBC: 3.71 MIL/uL — ABNORMAL LOW (ref 3.87–5.11)
RDW: 13.8 % (ref 11.5–15.5)
WBC: 12.7 10*3/uL — ABNORMAL HIGH (ref 4.0–10.5)

## 2012-10-19 LAB — BASIC METABOLIC PANEL
BUN: 9 mg/dL (ref 6–23)
CO2: 24 mEq/L (ref 19–32)
Calcium: 7.9 mg/dL — ABNORMAL LOW (ref 8.4–10.5)
Chloride: 107 mEq/L (ref 96–112)
Creatinine, Ser: 0.73 mg/dL (ref 0.50–1.10)
GFR calc Af Amer: >90 mL/min (ref >90)
GFR calc non Af Amer: >90 mL/min (ref >90)
Glucose, Bld: 98 mg/dL (ref 70–99)
Potassium: 3.9 mEq/L (ref 3.5–5.1)
Sodium: 140 mEq/L (ref 135–145)

## 2012-10-19 LAB — MRSA PCR SCREENING: MRSA by PCR: NEGATIVE

## 2012-10-19 LAB — CG4 I-STAT (LACTIC ACID): Lactic Acid, Venous: 0.55 mmol/L (ref 0.5–2.2)

## 2012-10-19 MED ORDER — DOCUSATE SODIUM 100 MG PO CAPS
100.0000 mg | ORAL_CAPSULE | Freq: Two times a day (BID) | ORAL | Status: DC
  Administered 2012-10-19 – 2012-10-22 (×7): 100 mg via ORAL
  Filled 2012-10-19 (×5): qty 1

## 2012-10-19 MED ORDER — TRAZODONE HCL 150 MG PO TABS
150.0000 mg | ORAL_TABLET | Freq: Every day | ORAL | Status: DC
  Administered 2012-10-19 – 2012-10-21 (×3): 150 mg via ORAL
  Filled 2012-10-19 (×4): qty 1

## 2012-10-19 MED ORDER — ONDANSETRON HCL 4 MG/2ML IJ SOLN: 4.0000 mg | Freq: Four times a day (QID) | INTRAMUSCULAR | Status: DC | PRN

## 2012-10-19 MED ORDER — POTASSIUM CHLORIDE IN NACL 20-0.9 MEQ/L-% IV SOLN
INTRAVENOUS | Status: DC
  Administered 2012-10-19 – 2012-10-21 (×5): via INTRAVENOUS
  Administered 2012-10-21: 150 mL/h via INTRAVENOUS
  Filled 2012-10-19 (×10): qty 1000

## 2012-10-19 MED ORDER — SODIUM CHLORIDE 0.9 % IJ SOLN
10.0000 mL | INTRAMUSCULAR | Status: DC | PRN
  Administered 2012-10-20 – 2012-10-21 (×3): 10 mL

## 2012-10-19 MED ORDER — ENOXAPARIN SODIUM 40 MG/0.4ML ~~LOC~~ SOLN
40.0000 mg | SUBCUTANEOUS | Status: DC
  Administered 2012-10-21 – 2012-10-22 (×2): 40 mg via SUBCUTANEOUS
  Filled 2012-10-19 (×3): qty 0.4

## 2012-10-19 MED ORDER — HYDROCODONE-ACETAMINOPHEN 5-325 MG PO TABS
1.0000 | ORAL_TABLET | ORAL | Status: DC | PRN
  Administered 2012-10-19 (×2): 2 via ORAL
  Administered 2012-10-19: 1 via ORAL
  Administered 2012-10-20 – 2012-10-21 (×4): 2 via ORAL
  Administered 2012-10-21: 1 via ORAL
  Administered 2012-10-21 – 2012-10-22 (×3): 2 via ORAL
  Filled 2012-10-19 (×4): qty 2
  Filled 2012-10-19 (×2): qty 1
  Filled 2012-10-19 (×5): qty 2

## 2012-10-19 MED ORDER — DIPHENHYDRAMINE HCL 50 MG/ML IJ SOLN
12.5000 mg | Freq: Four times a day (QID) | INTRAMUSCULAR | Status: DC | PRN
  Filled 2012-10-19: qty 1

## 2012-10-19 MED ORDER — VILAZODONE HCL 40 MG PO TABS
40.0000 mg | ORAL_TABLET | Freq: Every day | ORAL | Status: DC
  Administered 2012-10-19 – 2012-10-22 (×4): 40 mg via ORAL
  Filled 2012-10-19 (×5): qty 1

## 2012-10-19 MED ORDER — MORPHINE SULFATE 2 MG/ML IJ SOLN: 1.0000 mg | INTRAMUSCULAR | Status: DC | PRN

## 2012-10-19 MED ORDER — OXYMETAZOLINE HCL 0.05 % NA SOLN
2.0000 | Freq: Two times a day (BID) | NASAL | Status: DC | PRN
  Administered 2012-10-19: 2 via NASAL
  Filled 2012-10-19: qty 15

## 2012-10-19 MED ORDER — DIPHENHYDRAMINE HCL 12.5 MG/5ML PO ELIX
12.5000 mg | ORAL_SOLUTION | Freq: Four times a day (QID) | ORAL | Status: DC | PRN
  Administered 2012-10-19: 25 mg via ORAL
  Filled 2012-10-19 (×3): qty 10

## 2012-10-19 MED ORDER — PIPERACILLIN-TAZOBACTAM 3.375 G IVPB
3.3750 g | Freq: Three times a day (TID) | INTRAVENOUS | Status: DC
  Administered 2012-10-19 – 2012-10-22 (×11): 3.375 g via INTRAVENOUS
  Filled 2012-10-19 (×14): qty 50

## 2012-10-19 MED ORDER — BUPROPION HCL ER (XL) 150 MG PO TB24
150.0000 mg | ORAL_TABLET | Freq: Every day | ORAL | Status: DC
  Administered 2012-10-19 – 2012-10-22 (×4): 150 mg via ORAL
  Filled 2012-10-19 (×4): qty 1

## 2012-10-19 MED ORDER — TAMOXIFEN CITRATE 20 MG PO TABS: 20.0000 mg | ORAL_TABLET | Freq: Every day | ORAL | Status: DC

## 2012-10-19 MED ORDER — SODIUM CHLORIDE 0.9 % IV BOLUS (SEPSIS)
1000.0000 mL | Freq: Once | INTRAVENOUS | Status: AC
  Administered 2012-10-19: 1000 mL via INTRAVENOUS

## 2012-10-19 MED ORDER — VANCOMYCIN HCL IN DEXTROSE 1-5 GM/200ML-% IV SOLN
1000.0000 mg | Freq: Two times a day (BID) | INTRAVENOUS | Status: DC
  Administered 2012-10-19 – 2012-10-22 (×7): 1000 mg via INTRAVENOUS
  Filled 2012-10-19 (×8): qty 200

## 2012-10-19 MED ORDER — WHITE PETROLATUM GEL
Status: AC
  Administered 2012-10-19: 0.2
  Filled 2012-10-19: qty 5

## 2012-10-19 MED ORDER — VANCOMYCIN HCL IN DEXTROSE 1-5 GM/200ML-% IV SOLN
1000.0000 mg | Freq: Once | INTRAVENOUS | Status: AC
  Administered 2012-10-19: 1000 mg via INTRAVENOUS
  Filled 2012-10-19: qty 200

## 2012-10-19 MED ORDER — FENTANYL CITRATE 0.05 MG/ML IJ SOLN
50.0000 ug | Freq: Once | INTRAMUSCULAR | Status: AC
  Administered 2012-10-19: 50 ug via INTRAVENOUS
  Filled 2012-10-19: qty 2

## 2012-10-19 MED ORDER — TAMOXIFEN CITRATE 10 MG PO TABS
20.0000 mg | ORAL_TABLET | Freq: Every day | ORAL | Status: DC
  Administered 2012-10-19 – 2012-10-22 (×4): 20 mg via ORAL
  Filled 2012-10-19 (×4): qty 2

## 2012-10-19 NOTE — Progress Notes (Signed)
ANTIBIOTIC CONSULT NOTE - INITIAL  Pharmacy Consult for Vancomycin and Zosyn  Indication: cellulitis  No Known Allergies  Patient Measurements: Height: 5\' 4"  (162.6 cm) Weight: 115 lb (52.164 kg) IBW/kg (Calculated) : 54.7  Vital Signs: Temp: 102.3 F (39.1 C) (03/24 2320) Temp src: Oral (03/24 2320) BP: 81/43 mmHg (03/25 0120) Pulse Rate: 85 (03/25 0120)  Labs:  Recent Labs  10/18/12 1952  WBC 14.4*  HGB 12.8  PLT 181  CREATININE 0.86   Estimated Creatinine Clearance: 63.8 ml/min (by C-G formula based on Cr of 0.86). No results found for this basename: VANCOTROUGH, VANCOPEAK, VANCORANDOM, GENTTROUGH, GENTPEAK, GENTRANDOM, TOBRATROUGH, TOBRAPEAK, TOBRARND, AMIKACINPEAK, AMIKACINTROU, AMIKACIN,  in the last 72 hours   Microbiology: No results found for this or any previous visit (from the past 720 hour(s)).  Medical History: Past Medical History  Diagnosis Date  . Anxiety   . Depression   . Breast cancer 05/05/12    right, ER +, PR -, Her 2 -  . Arthritis     Medications:  Wellbutrin XL  Tamoxifen  Trazodone  Viibryd  Assessment: 52 yo female with swelling and redness around mastectomy site for empiric antibiotics  Vancomycin 1 g IV given in ED at 0030  Goal of Therapy:  Vancomycin trough level 10-15 mcg/ml  Plan:  Vancomycin 1 g IV q12h Zosyn 3.375 g IV q8h  Kelly [name], Gary Fleet 10/19/2012,1:55 AM

## 2012-10-19 NOTE — Progress Notes (Signed)
Pt transferred to room 6N20, pt alert and oriented, pt oriented to room and unit, pt resting in bed with call bell in reach

## 2012-10-19 NOTE — Plan of Care (Signed)
Problem: Phase I Progression Outcomes Goal: Pain controlled with appropriate interventions Outcome: Progressing Some relief with pain control measures Goal: Voiding-avoid urinary catheter unless indicated Outcome: Progressing Pt voided clear yellow urine Goal: Hemodynamically stable Outcome: Progressing bp 86-90's systolic, asymptomatic

## 2012-10-19 NOTE — Progress Notes (Signed)
IV infiltrated, IV team called, unable to start IV, Dr. Dwain Sarna called order to insert PICC line obtained, IV nurse at 7pm will insert PiCC line

## 2012-10-19 NOTE — Progress Notes (Signed)
Pt complaining of itching, no redness noted or any other complaints, benadryl given with relief

## 2012-10-19 NOTE — ED Notes (Signed)
Dr. Andrey Campanile at bedside to perform needle aspiration. Pt tolerated well. Dry Gauze applied to area.

## 2012-10-19 NOTE — ED Notes (Signed)
Dr Wilson at bedside.

## 2012-10-19 NOTE — H&P (Signed)
Kelly [name] is an 52 y.o. female.   Chief Complaint: fever, malaise, increasing pain at surgery site  HPI: 52 yo WF with h/o Right Breast cancer s/p MRM 05/2012 for T2N2a right breast cancer s/p XRT who underwent prophylactic Left mastectomy with SLNBx on 3/6 came to the ED earlier this evening c/o increasing pain, swelling, and redness at recent L mastectomy site. She had formed a small seroma which was aspirated on 3/21 by Dr Jamey Ripa.  She states that she noticed increased swelling and discomfort over the weekend. Today she developed fever, chills, malaise and headache. She also felt weak. She felt so bad that she felt she needed to come to the emergency room. In the emergency room she was found to be febrile with a temperature of 102, a systolic blood pressure in the mid 80s, and a white blood cell count of 14,000.  Past Medical History  Diagnosis Date  . Anxiety   . Depression   . Breast cancer 05/05/12    right, ER +, PR -, Her 2 -  . Arthritis     Past Surgical History  Procedure Laterality Date  . Cesarean section      x3  . Joint replacement      Rt and Lt thumbs  . Cervical conization w/bx    . Breast surgery      several biopsies, needle aspiration  . Mastectomy w/ sentinel node biopsy  06/07/2012    Procedure: MASTECTOMY WITH SENTINEL LYMPH NODE BIOPSY;  Surgeon: Currie Paris, MD;  Location: [facility] OR;  Service: General;  Laterality: Right;  right total mastectomy and sentinel node  . Mastectomy modified radical  06/07/2012    Procedure: MASTECTOMY MODIFIED RADICAL;  Surgeon: Currie Paris, MD;  Location: [facility] OR;  Service: General;  Laterality: Right;  . Tubal ligation    . Simple mastectomy with axillary sentinel node biopsy Left 09/30/2012    Procedure: SIMPLE MASTECTOMY;  Surgeon: Currie Paris, MD;  Location: [facility];  Service: General;  Laterality: Left;  left total mastectomy    Family History  Problem Relation Age of Onset  . Breast  cancer Mother 36  . Lymphoma Mother 5    non-hodgkins lymphoma in her epiglottis  . Cancer Mother     non-hodkins lymphoma, breast  . Stroke Maternal Grandfather   . Bipolar disorder Brother   . Gout Brother   . Breast cancer Other     MGM's sister; dx <50  . Breast cancer Other 40    MGF's sister  . Brain cancer Other     MGM's sister; dx <50  . Leukemia Other     mother's maternal cousin; dx under 10   Social History:  reports that she has never smoked. She has never used smokeless tobacco. She reports that  drinks alcohol. She reports that she does not use illicit drugs.  Allergies: No Known Allergies   (Not in a hospital admission)  Results for orders placed during the hospital encounter of 10/18/12 (from the past 48 hour(s))  CBC WITH DIFFERENTIAL     Status: Abnormal   Collection Time    10/18/12  7:52 PM      Result Value Range   WBC 14.4 (*) 4.0 - 10.5 K/uL   RBC 4.11  3.87 - 5.11 MIL/uL   Hemoglobin 12.8  12.0 - 15.0 g/dL   HCT 16.1  09.6 - 04.5 %   MCV 88.1  78.0 - 100.0 fL  MCH 31.1  26.0 - 34.0 pg   MCHC 35.4  30.0 - 36.0 g/dL   RDW 16.1  09.6 - 04.5 %   Platelets 181  150 - 400 K/uL   Neutrophils Relative 91 (*) 43 - 77 %   Neutro Abs 13.1 (*) 1.7 - 7.7 K/uL   Lymphocytes Relative 2 (*) 12 - 46 %   Lymphs Abs 0.3 (*) 0.7 - 4.0 K/uL   Monocytes Relative 6  3 - 12 %   Monocytes Absolute 0.9  0.1 - 1.0 K/uL   Eosinophils Relative 0  0 - 5 %   Eosinophils Absolute 0.0  0.0 - 0.7 K/uL   Basophils Relative 0  0 - 1 %   Basophils Absolute 0.0  0.0 - 0.1 K/uL  COMPREHENSIVE METABOLIC PANEL     Status: Abnormal   Collection Time    10/18/12  7:52 PM      Result Value Range   Sodium 137  135 - 145 mEq/L   Potassium 3.7  3.5 - 5.1 mEq/L   Chloride 99  96 - 112 mEq/L   CO2 29  19 - 32 mEq/L   Glucose, Bld 93  70 - 99 mg/dL   BUN 14  6 - 23 mg/dL   Creatinine, Ser 4.09  0.50 - 1.10 mg/dL   Calcium 9.3  8.4 - 81.1 mg/dL   Total Protein 6.8  6.0 - 8.3 g/dL     Albumin 3.6  3.5 - 5.2 g/dL   AST 37  0 - 37 U/L   ALT 25  0 - 35 U/L   Alkaline Phosphatase 87  39 - 117 U/L   Total Bilirubin 1.0  0.3 - 1.2 mg/dL   GFR calc non Af Amer 77 (*) >90 mL/min   GFR calc Af Amer 89 (*) >90 mL/min   Comment:            The eGFR has been calculated     using the CKD EPI equation.     This calculation has not been     validated in all clinical     situations.     eGFR's persistently     <90 mL/min signify     possible Chronic Kidney Disease.  CG4 I-STAT (LACTIC ACID)     Status: None   Collection Time    10/18/12  8:25 PM      Result Value Range   Lactic Acid, Venous 1.34  0.5 - 2.2 mmol/L  URINALYSIS, ROUTINE W REFLEX MICROSCOPIC     Status: None   Collection Time    10/18/12  8:35 PM      Result Value Range   Color, Urine YELLOW  YELLOW   APPearance CLEAR  CLEAR   Specific Gravity, Urine 1.011  1.005 - 1.030   pH 7.5  5.0 - 8.0   Glucose, UA NEGATIVE  NEGATIVE mg/dL   Hgb urine dipstick NEGATIVE  NEGATIVE   Bilirubin Urine NEGATIVE  NEGATIVE   Ketones, ur NEGATIVE  NEGATIVE mg/dL   Protein, ur NEGATIVE  NEGATIVE mg/dL   Urobilinogen, UA 0.2  0.0 - 1.0 mg/dL   Nitrite NEGATIVE  NEGATIVE   Leukocytes, UA NEGATIVE  NEGATIVE   Comment: MICROSCOPIC NOT DONE ON URINES WITH NEGATIVE PROTEIN, BLOOD, LEUKOCYTES, NITRITE, OR GLUCOSE <1000 mg/dL.  CG4 I-STAT (LACTIC ACID)     Status: None   Collection Time    10/19/12 12:10 AM      Result Value Range  Lactic Acid, Venous 0.55  0.5 - 2.2 mmol/L   Dg Chest 2 View  10/18/2012  *RADIOLOGY REPORT*  Clinical Data: Dizziness.  Mastectomy.  Possible wound infection.  CHEST - 2 VIEW  Comparison: 02/27/2011  Findings: Postoperative changes over the chest are noted.  Lungs are hyperaerated and grossly clear.  Increased density at the left lung base likely reflects overlying soft tissue fullness.  No pneumothorax.  Normal heart size.  IMPRESSION: No active cardiopulmonary disease.  Postoperative and chronic  changes are noted.   Original Report Authenticated By: Jolaine Click, M.D.     Review of Systems  Constitutional: Positive for fever, chills and diaphoresis.  HENT: Negative for hearing loss and nosebleeds.   Eyes: Negative for blurred vision and double vision.  Respiratory: Positive for shortness of breath.   Cardiovascular: Negative for chest pain, palpitations and leg swelling.  Gastrointestinal: Negative for nausea, vomiting and abdominal pain.  Genitourinary: Positive for frequency. Negative for dysuria, urgency and hematuria.  Musculoskeletal: Positive for myalgias. Negative for joint pain and falls.  Skin: Negative for itching and rash.  Neurological: Positive for weakness and headaches. Negative for tingling, tremors, sensory change, seizures and loss of consciousness.  Psychiatric/Behavioral: Negative for substance abuse.    Blood pressure 95/51, pulse 99, temperature 102.3 F (39.1 C), temperature source Oral, resp. rate 21, height 5\' 4"  (1.626 m), weight 115 lb (52.164 kg), last menstrual period 07/28/2012, SpO2 95.00%. Physical Exam  Vitals reviewed. Constitutional: She is oriented to person, place, and time. She appears well-developed and well-nourished. She is cooperative. She has a sickly appearance.  A little groggy  HENT:  Head: Normocephalic and atraumatic.  Right Ear: External ear normal.  Left Ear: External ear normal.  Eyes: Conjunctivae and EOM are normal.  Neck: Neck supple. No tracheal deviation present. No thyromegaly present.  Cardiovascular: Normal rate, regular rhythm, normal heart sounds and intact distal pulses.   Respiratory: Effort normal and breath sounds normal. No stridor. No respiratory distress. She has no wheezes. Left breast exhibits skin change and tenderness.    Well healed L mastectomy scar with surrounding cellulitis - extending approx 3.5cm sup and inf to incision and into axilla. Soft, +seroma. Mild TTP. No induration. Rt mastectomy - well  healed. No fluid.   GI: Soft. She exhibits no distension. There is no tenderness. There is no rebound and no guarding.  Musculoskeletal: She exhibits no edema and no tenderness.  Lymphadenopathy:    She has no cervical adenopathy.  Neurological: She is alert and oriented to person, place, and time.  Skin: Skin is warm and dry. No rash noted. There is erythema. There is pallor.  Psychiatric: She has a normal mood and affect. Her behavior is normal. Judgment and thought content normal.     Assessment/Plan Postoperative cellulitis and seroma of left mastectomy site Fever Hypotension Leukocytosis Right Breast Cancer  Because of her cellulitis, mild hypotension and malaise I believe she needs to be admitted for IV antibiotics as well as for monitoring. I will drain some of the seroma fluid and send it for Gram stain and culture. She'll be started on broad-spectrum IV antibiotics. Dr. Jamey Ripa will see her in the morning  I was able to aspirate about 15cc of pinkish-straw colored fluid - sent for G stain and culture.   Mary Sella. Andrey Campanile, MD, FACS General, Bariatric, & Minimally Invasive Surgery [facility], [place]    [facility] M 10/19/2012, 12:57 AM

## 2012-10-19 NOTE — Progress Notes (Signed)
Peripherally Inserted Central Catheter/Midline Placement  The IV Nurse has discussed with the patient and/or persons authorized to consent for the patient, the purpose of this procedure and the potential benefits and risks involved with this procedure.  The benefits include less needle sticks, lab draws from the catheter and patient may be discharged home with the catheter.  Risks include, but not limited to, infection, bleeding, blood clot (thrombus formation), and puncture of an artery; nerve damage and irregular heat beat.  Alternatives to this procedure were also discussed.  PICC/Midline Placement Documentation  PICC / Midline Double Lumen 10/19/12 PICC Left Basilic (Active)       Kelly [name], Lajean Manes 10/19/2012, 6:58 PM

## 2012-10-19 NOTE — ED Notes (Signed)
Dr. Andrey Campanile made aware of pt's BP 80/42 after second fluid bolus. Received verbal order from Dr. Andrey Campanile to change level of care to Step-Down.

## 2012-10-19 NOTE — ED Notes (Signed)
Pt requests Makinlee Awwad, significant other, be contacted with status changes. [phone]

## 2012-10-19 NOTE — Progress Notes (Signed)
<principal problem not specified>  Assessment: Infected seroma right mastectomy site, wbc improved, on antibiotics  Plan: Continue antibiotics for now. Cultures pending. Aspirated 120cc slightly cloudy fluid. Should be able to go to floor today   Subjective: Feels slightly better, still fatigued, anxious.  Objective: Vital signs in last 24 hours: Temp:  [97.9 F (36.6 C)-102.3 F (39.1 C)] 97.9 F (36.6 C) (03/25 0735) Pulse Rate:  [65-101] 65 (03/25 0800) Resp:  [10-25] 10 (03/25 0800) BP: (79-114)/(38-57) 79/51 mmHg (03/25 0800) SpO2:  [94 %-99 %] 96 % (03/25 0630) Weight:  [115 lb (52.164 kg)-119 lb 0.8 oz (54 kg)] 119 lb 0.8 oz (54 kg) (03/25 0514) Last BM Date: 10/18/12  Intake/Output from previous day: 03/24 0701 - 03/25 0700 In: 420 [P.O.:120; I.V.:300] Out: 500 [Urine:500]  General appearance: alert, cooperative, mild distress and tired. Does not appear toxic Breasts: normal appearance, no masses or tenderness, Slight erythema of the mastectomy site, but less than noted buy Dr Andrey Campanile last night. Seroma present  Lab Results:  Results for orders placed during the hospital encounter of 10/18/12 (from the past 24 hour(s))  CBC WITH DIFFERENTIAL     Status: Abnormal   Collection Time    10/18/12  7:52 PM      Result Value Range   WBC 14.4 (*) 4.0 - 10.5 K/uL   RBC 4.11  3.87 - 5.11 MIL/uL   Hemoglobin 12.8  12.0 - 15.0 g/dL   HCT 84.6  96.2 - 95.2 %   MCV 88.1  78.0 - 100.0 fL   MCH 31.1  26.0 - 34.0 pg   MCHC 35.4  30.0 - 36.0 g/dL   RDW 84.1  32.4 - 40.1 %   Platelets 181  150 - 400 K/uL   Neutrophils Relative 91 (*) 43 - 77 %   Neutro Abs 13.1 (*) 1.7 - 7.7 K/uL   Lymphocytes Relative 2 (*) 12 - 46 %   Lymphs Abs 0.3 (*) 0.7 - 4.0 K/uL   Monocytes Relative 6  3 - 12 %   Monocytes Absolute 0.9  0.1 - 1.0 K/uL   Eosinophils Relative 0  0 - 5 %   Eosinophils Absolute 0.0  0.0 - 0.7 K/uL   Basophils Relative 0  0 - 1 %   Basophils Absolute 0.0  0.0 - 0.1  K/uL  COMPREHENSIVE METABOLIC PANEL     Status: Abnormal   Collection Time    10/18/12  7:52 PM      Result Value Range   Sodium 137  135 - 145 mEq/L   Potassium 3.7  3.5 - 5.1 mEq/L   Chloride 99  96 - 112 mEq/L   CO2 29  19 - 32 mEq/L   Glucose, Bld 93  70 - 99 mg/dL   BUN 14  6 - 23 mg/dL   Creatinine, Ser 0.27  0.50 - 1.10 mg/dL   Calcium 9.3  8.4 - 25.3 mg/dL   Total Protein 6.8  6.0 - 8.3 g/dL   Albumin 3.6  3.5 - 5.2 g/dL   AST 37  0 - 37 U/L   ALT 25  0 - 35 U/L   Alkaline Phosphatase 87  39 - 117 U/L   Total Bilirubin 1.0  0.3 - 1.2 mg/dL   GFR calc non Af Amer 77 (*) >90 mL/min   GFR calc Af Amer 89 (*) >90 mL/min  CG4 I-STAT (LACTIC ACID)     Status: None   Collection Time  10/18/12  8:25 PM      Result Value Range   Lactic Acid, Venous 1.34  0.5 - 2.2 mmol/L  URINALYSIS, ROUTINE W REFLEX MICROSCOPIC     Status: None   Collection Time    10/18/12  8:35 PM      Result Value Range   Color, Urine YELLOW  YELLOW   APPearance CLEAR  CLEAR   Specific Gravity, Urine 1.011  1.005 - 1.030   pH 7.5  5.0 - 8.0   Glucose, UA NEGATIVE  NEGATIVE mg/dL   Hgb urine dipstick NEGATIVE  NEGATIVE   Bilirubin Urine NEGATIVE  NEGATIVE   Ketones, ur NEGATIVE  NEGATIVE mg/dL   Protein, ur NEGATIVE  NEGATIVE mg/dL   Urobilinogen, UA 0.2  0.0 - 1.0 mg/dL   Nitrite NEGATIVE  NEGATIVE   Leukocytes, UA NEGATIVE  NEGATIVE  CG4 I-STAT (LACTIC ACID)     Status: None   Collection Time    10/19/12 12:10 AM      Result Value Range   Lactic Acid, Venous 0.55  0.5 - 2.2 mmol/L  MRSA PCR SCREENING     Status: None   Collection Time    10/19/12  5:06 AM      Result Value Range   MRSA by PCR NEGATIVE  NEGATIVE  BASIC METABOLIC PANEL     Status: Abnormal   Collection Time    10/19/12  5:10 AM      Result Value Range   Sodium 140  135 - 145 mEq/L   Potassium 3.9  3.5 - 5.1 mEq/L   Chloride 107  96 - 112 mEq/L   CO2 24  19 - 32 mEq/L   Glucose, Bld 98  70 - 99 mg/dL   BUN 9  6 - 23  mg/dL   Creatinine, Ser 1.61  0.50 - 1.10 mg/dL   Calcium 7.9 (*) 8.4 - 10.5 mg/dL   GFR calc non Af Amer >90  >90 mL/min   GFR calc Af Amer >90  >90 mL/min  CBC     Status: Abnormal   Collection Time    10/19/12  5:10 AM      Result Value Range   WBC 12.7 (*) 4.0 - 10.5 K/uL   RBC 3.71 (*) 3.87 - 5.11 MIL/uL   Hemoglobin 11.1 (*) 12.0 - 15.0 g/dL   HCT 09.6 (*) 04.5 - 40.9 %   MCV 87.1  78.0 - 100.0 fL   MCH 29.9  26.0 - 34.0 pg   MCHC 34.4  30.0 - 36.0 g/dL   RDW 81.1  91.4 - 78.2 %   Platelets 155  150 - 400 K/uL     Studies/Results Radiology     MEDS, Scheduled . buPROPion  150 mg Oral Daily  . docusate sodium  100 mg Oral BID  . [START ON 10/20/2012] enoxaparin (LOVENOX) injection  40 mg Subcutaneous Q24H  . piperacillin-tazobactam (ZOSYN)  IV  3.375 g Intravenous Q8H  . vancomycin  1,000 mg Intravenous Q12H       LOS: 1 day    Currie Paris, MD, [facility], [place] [phone]   10/19/2012 9:01 AM

## 2012-10-19 NOTE — Progress Notes (Signed)
Nutrition Brief Note  Patient identified on the Malnutrition Screening Tool (MST) Report. This RD discussed patient's weight history. She confirms that her weight has remained relatively stable, as confirmed below.  Wt Readings from Last 10 Encounters:  10/19/12 119 lb 0.8 oz (54 kg)  10/15/12 116 lb 12.5 oz (52.971 kg)  10/12/12 114 lb 14.4 oz (52.118 kg)  10/08/12 115 lb (52.164 kg)  09/30/12 114 lb 2 oz (51.767 kg)  09/30/12 114 lb 2 oz (51.767 kg)  09/14/12 116 lb 6.4 oz (52.799 kg)  09/03/12 113 lb 1.6 oz (51.302 kg)  08/31/12 117 lb (53.071 kg)  08/24/12 117 lb 6.4 oz (53.252 kg)   Pt states that she is hungry and ready to eat. She denies ever seeing a nutritionist during her cancer treatment however she notes that a lot of her friends are healthy eaters and give her lots of advice. She denies any specific questions at this time.  Body mass index is 20.42 kg/(m^2). Patient meets criteria for normal weight based on current BMI.   Current diet order is Regular. Labs and medications reviewed.   No nutrition interventions warranted at this time. If nutrition issues arise, please consult RD.   Jarold Motto MS, RD, LDN Pager: [phone] After-hours pager: [phone]

## 2012-10-19 NOTE — Care Management Note (Unsigned)
    Page 1 of 1   10/19/2012     10:39:09 AM   CARE MANAGEMENT NOTE 10/19/2012  Patient:  CARLETTA, FEASEL   Account Number:  [id number]  Date Initiated:  10/19/2012  Documentation initiated by:  GRAVES-BIGELOW,Dorlis Judice  Subjective/Objective Assessment:   Pt admitted with Infected  right mastectomy site, wbc improved, on antibiotics. Pt had low bp and was given fluid bolus.     Action/Plan:   Cm will conitnue to monitor for disposition needs.   Anticipated DC Date:  10/21/2012   Anticipated DC Plan:  HOME/SELF CARE      DC Planning Services  CM consult      Choice offered to / List presented to:             Status of service:  In process, will continue to follow Medicare Important Message given?   (If response is "NO", the following Medicare IM given date fields will be blank) Date Medicare IM given:   Date Additional Medicare IM given:    Discharge Disposition:    Per UR Regulation:  Reviewed for med. necessity/level of care/duration of stay  If discussed at Long Length of Stay Meetings, dates discussed:    Comments:

## 2012-10-19 NOTE — Progress Notes (Signed)
UR Completed Sora Vrooman Graves-Bigelow, RN,BSN [phone]  

## 2012-10-20 ENCOUNTER — Other Ambulatory Visit: Payer: Self-pay | Admitting: Oncology

## 2012-10-20 LAB — CBC
HCT: 31.6 % — ABNORMAL LOW (ref 36.0–46.0)
Hemoglobin: 10.9 g/dL — ABNORMAL LOW (ref 12.0–15.0)
MCH: 31.2 pg (ref 26.0–34.0)
MCHC: 34.5 g/dL (ref 30.0–36.0)
MCV: 90.5 fL (ref 78.0–100.0)
Platelets: 170 10*3/uL (ref 150–400)
RBC: 3.49 MIL/uL — ABNORMAL LOW (ref 3.87–5.11)
RDW: 14 % (ref 11.5–15.5)
WBC: 10 10*3/uL (ref 4.0–10.5)

## 2012-10-20 MED ORDER — POLYETHYLENE GLYCOL 3350 17 G PO PACK
17.0000 g | PACK | Freq: Once | ORAL | Status: DC
  Filled 2012-10-20 (×2): qty 1

## 2012-10-20 MED ORDER — BISACODYL 10 MG RE SUPP
10.0000 mg | Freq: Once | RECTAL | Status: AC
  Administered 2012-10-20: 10 mg via RECTAL
  Filled 2012-10-20: qty 1

## 2012-10-20 NOTE — Progress Notes (Signed)
Infected seroma, postoperative  Assessment: Improved, recurrent fluid  Plan: Continue IV ab pending culture report Will place drain later today - discused with her   Subjective: Feels better, less fatigued, eating ok not much pain  Objective: Vital signs in last 24 hours: Temp:  [97.5 F (36.4 C)-100 F (37.8 C)] 99.3 F (37.4 C) (03/26 0700) Pulse Rate:  [65-96] 96 (03/26 0545) Resp:  [9-17] 16 (03/26 0545) BP: (79-115)/(36-54) 115/49 mmHg (03/26 0545) SpO2:  [97 %-100 %] 100 % (03/26 0545) Weight:  [117 lb 3.2 oz (53.162 kg)] 117 lb 3.2 oz (53.162 kg) (03/25 1200) Last BM Date: 10/18/12  Intake/Output from previous day: 03/25 0701 - 03/26 0700 In: 2830 [P.O.:480; I.V.:1800; IV Piggyback:550] Out: 3750 [Urine:3750]  General appearance: alert, cooperative and no distress Incision/Wound:Less erythema than yesterday but has reaccumulated fluid  Lab Results:  Results for orders placed during the hospital encounter of 10/18/12 (from the past 24 hour(s))  CBC     Status: Abnormal   Collection Time    10/20/12  6:48 AM      Result Value Range   WBC 10.0  4.0 - 10.5 K/uL   RBC 3.49 (*) 3.87 - 5.11 MIL/uL   Hemoglobin 10.9 (*) 12.0 - 15.0 g/dL   HCT 16.1 (*) 09.6 - 04.5 %   MCV 90.5  78.0 - 100.0 fL   MCH 31.2  26.0 - 34.0 pg   MCHC 34.5  30.0 - 36.0 g/dL   RDW 40.9  81.1 - 91.4 %   Platelets 170  150 - 400 K/uL     Studies/Results Radiology     MEDS, Scheduled . buPROPion  150 mg Oral Daily  . docusate sodium  100 mg Oral BID  . enoxaparin (LOVENOX) injection  40 mg Subcutaneous Q24H  . piperacillin-tazobactam (ZOSYN)  IV  3.375 g Intravenous Q8H  . tamoxifen  20 mg Oral Daily  . traZODone  150 mg Oral QHS  . vancomycin  1,000 mg Intravenous Q12H  . Vilazodone HCl  40 mg Oral Daily       LOS: 2 days    Currie Paris, MD, [facility], [place] [phone]   10/20/2012 7:25 AM

## 2012-10-20 NOTE — Progress Notes (Signed)
Under sterile prep and 1% xylo I placed a seroma cath into the seroma cavity with good function.Sutured with 3-0 Nylon X 2.

## 2012-10-21 ENCOUNTER — Telehealth (INDEPENDENT_AMBULATORY_CARE_PROVIDER_SITE_OTHER): Payer: Self-pay | Admitting: General Surgery

## 2012-10-21 LAB — BODY FLUID CULTURE

## 2012-10-21 MED ORDER — POLYETHYLENE GLYCOL 3350 17 G PO PACK
17.0000 g | PACK | Freq: Every day | ORAL | Status: DC | PRN
  Administered 2012-10-21: 17 g via ORAL
  Filled 2012-10-21: qty 1

## 2012-10-21 NOTE — Telephone Encounter (Signed)
Nurse given appt adn will let patient know at discharge.

## 2012-10-21 NOTE — Telephone Encounter (Signed)
Message copied by Liliana Cline on Thu Oct 21, 2012  1:20 PM ------      Message from: Currie Paris      Created: Thu Oct 21, 2012 12:23 PM       She will probably go home tomorrow -Canyou give her an appointment for early next week.            She is on [address], but I don't know the direct dial. The nurses station is [phone].            Thanks ------

## 2012-10-21 NOTE — Progress Notes (Signed)
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 She told me tha tDr Magrinat was going to order one, but he reports to me that he was not. I told her there is no scan appropriate at this time, that clinical follow up is best means, other thatn perhaps a PET scan in the future to monitor for distant mets since she has continued to decline chemo   Subjective: Feels better, less pain. constipated  Objective: Vital signs in last 24 hours: Temp:  [97.8 F (36.6 C)-98.4 F (36.9 C)] 97.9 F (36.6 C) (03/27 0616) Pulse Rate:  [70-78] 70 (03/27 0616) Resp:  [16-17] 16 (03/27 0616) BP: (92-120)/(45-53) 92/48 mmHg (03/27 0616) SpO2:  [98 %-100 %] 100 % (03/27 0616) Last BM Date: 10/18/12  Intake/Output from previous day: 03/26 0701 - 03/27 0700 In: 4395 [P.O.:600; I.V.:3795] Out: 2330 [Urine:2200; Drains:130]  General appearance: alert, cooperative and no distress wound - erythema resolved and no residual seroma  Lab Results:  No results found for this or any previous visit (from the past 24 hour(s)).   Studies/Results Radiology     MEDS, Scheduled . buPROPion  150 mg Oral Daily  . docusate sodium  100 mg Oral BID  . enoxaparin (LOVENOX) injection  40 mg Subcutaneous Q24H  . piperacillin-tazobactam (ZOSYN)  IV  3.375 g Intravenous Q8H  . polyethylene glycol  17 g Oral Once  . tamoxifen  20 mg Oral Daily  . traZODone  150 mg Oral QHS  . vancomycin  1,000 mg Intravenous Q12H  . Vilazodone HCl  40 mg Oral Daily       LOS: 3 days    Currie Paris, MD, [facility], [place] [phone]   10/21/2012 9:27 AM

## 2012-10-22 ENCOUNTER — Encounter (INDEPENDENT_AMBULATORY_CARE_PROVIDER_SITE_OTHER): Payer: BC Managed Care – PPO | Admitting: Surgery

## 2012-10-22 MED ORDER — DOXYCYCLINE HYCLATE 100 MG PO TABS: 100.0000 mg | ORAL_TABLET | Freq: Two times a day (BID) | ORAL | Status: DC

## 2012-10-22 MED ORDER — HYDROCODONE-ACETAMINOPHEN 5-325 MG PO TABS: 1.0000 | ORAL_TABLET | Freq: Four times a day (QID) | ORAL | Status: DC | PRN

## 2012-10-22 NOTE — Discharge Summary (Signed)
Patient ID: Kelly [name] [id number] 52 y.o. 10/08/1960  Admission  Date: 10/18/2012  Discharge date and time: No discharge date for patient encounter. 10/22/12  Admitting Physician: Currie Paris  Discharge Physician: Currie Paris  Admission Diagnoses: Postoperative wound infection, initial encounter [998.59] Seroma, postoperative, subsequent encounter [V58.89]  Discharge Diagnoses: Infected seroma, s/p left mastectomy  Operations:   Discharged Condition: good  Hospital Course: The patient was admitted and begun on IV antibiotics, Vanc and Zosyn. The next AM the seroma was aspitated of 120 cc. She clinicaally improved but had recurence of the seroma the next morning so a seroma-cath was placed. The seroma remained empty for the next two days, the patient was afebrile and the erythema noted on admission almost totally resolved. The culture showed MRSA sensitive to Doxy, so will discharge on doxy 100 BID X 14 days.  Consults: None  Significant Diagnostic Studies: labs: C&S = MRSA   Disposition: Home

## 2012-10-22 NOTE — Progress Notes (Signed)
Infected seroma, postoperative  Assessment: Improved with IV antibiotics  Plan: Will d/c today on oral antibiotics (doxycycline)   Subjective: Feels OK and would like to go home  Objective: Vital signs in last 24 hours: Temp:  [97.4 F (36.3 C)-98.7 F (37.1 C)] 97.4 F (36.3 C) (03/28 0612) Pulse Rate:  [65-80] 65 (03/28 0612) Resp:  [16-18] 18 (03/28 0612) BP: (105-116)/(44-65) 105/55 mmHg (03/28 0612) SpO2:  [98 %-100 %] 100 % (03/28 0612) Last BM Date: 10/21/12  Intake/Output from previous day: 03/27 0701 - 03/28 0700 In: 2873.5 [P.O.:420; I.V.:1253.5; IV Piggyback:1200] Out: 3355 [Urine:3350; Drains:5]  General appearance: alert, cooperative and no distress Incision/Wound:Erythema almost resolved. Drain slowed down and no re-accumulation of fluid  Lab Results:  No results found for this or any previous visit (from the past 24 hour(s)).   Studies/Results Radiology     MEDS, Scheduled . buPROPion  150 mg Oral Daily  . docusate sodium  100 mg Oral BID  . enoxaparin (LOVENOX) injection  40 mg Subcutaneous Q24H  . piperacillin-tazobactam (ZOSYN)  IV  3.375 g Intravenous Q8H  . polyethylene glycol  17 g Oral Once  . tamoxifen  20 mg Oral Daily  . traZODone  150 mg Oral QHS  . vancomycin  1,000 mg Intravenous Q12H  . Vilazodone HCl  40 mg Oral Daily       LOS: 4 days    Currie Paris, MD, [facility], [place] [phone]   10/22/2012 8:03 AM

## 2012-10-22 NOTE — Progress Notes (Signed)
Patient discharged to home with instructions, verbalized understanding. 

## 2012-10-25 LAB — CULTURE, BLOOD (ROUTINE X 2)
Culture: NO GROWTH
Culture: NO GROWTH
Culture: NO GROWTH
Culture: NO GROWTH

## 2012-10-26 ENCOUNTER — Encounter (INDEPENDENT_AMBULATORY_CARE_PROVIDER_SITE_OTHER): Payer: Self-pay | Admitting: Surgery

## 2012-10-26 ENCOUNTER — Ambulatory Visit (INDEPENDENT_AMBULATORY_CARE_PROVIDER_SITE_OTHER): Payer: BC Managed Care – PPO | Admitting: Surgery

## 2012-10-26 VITALS — BP 120/64 | HR 60 | Temp 97.0°F | Resp 16 | Ht 64.0 in | Wt 115.0 lb

## 2012-10-26 DIAGNOSIS — Z09 Encounter for follow-up examination after completed treatment for conditions other than malignant neoplasm: Secondary | ICD-10-CM

## 2012-10-26 NOTE — Progress Notes (Signed)
CC: Wound check  HPI she was in last week with infected seroma, rx antibiotics and seroma cath. Still discomfort in mastectmy site, heavy feeling. Notes that the left has hurt and een more uncomfortable than the right was. No fevers. Almost no drainage ouot the seroma cath > Removed. ? Of fluid medially -aspirated none found.  Imp: Improved  Plan RTC two days.

## 2012-10-26 NOTE — Patient Instructions (Signed)
Continue antibiotics and see me again Thursday

## 2012-10-28 ENCOUNTER — Ambulatory Visit (HOSPITAL_BASED_OUTPATIENT_CLINIC_OR_DEPARTMENT_OTHER): Payer: BC Managed Care – PPO | Admitting: Oncology

## 2012-10-28 ENCOUNTER — Ambulatory Visit (INDEPENDENT_AMBULATORY_CARE_PROVIDER_SITE_OTHER): Payer: BC Managed Care – PPO | Admitting: Surgery

## 2012-10-28 ENCOUNTER — Encounter (INDEPENDENT_AMBULATORY_CARE_PROVIDER_SITE_OTHER): Payer: Self-pay | Admitting: Surgery

## 2012-10-28 ENCOUNTER — Other Ambulatory Visit (HOSPITAL_BASED_OUTPATIENT_CLINIC_OR_DEPARTMENT_OTHER): Payer: BC Managed Care – PPO | Admitting: Lab

## 2012-10-28 ENCOUNTER — Other Ambulatory Visit: Payer: Self-pay | Admitting: Oncology

## 2012-10-28 VITALS — BP 112/64 | HR 68 | Temp 97.2°F | Resp 18 | Ht 64.0 in | Wt 115.0 lb

## 2012-10-28 VITALS — BP 100/65 | HR 76 | Temp 98.7°F | Resp 20 | Ht 64.0 in | Wt 115.5 lb

## 2012-10-28 DIAGNOSIS — C50919 Malignant neoplasm of unspecified site of unspecified female breast: Secondary | ICD-10-CM

## 2012-10-28 DIAGNOSIS — C50519 Malignant neoplasm of lower-outer quadrant of unspecified female breast: Secondary | ICD-10-CM

## 2012-10-28 DIAGNOSIS — C50911 Malignant neoplasm of unspecified site of right female breast: Secondary | ICD-10-CM

## 2012-10-28 DIAGNOSIS — IMO0001 Reserved for inherently not codable concepts without codable children: Secondary | ICD-10-CM

## 2012-10-28 DIAGNOSIS — C50419 Malignant neoplasm of upper-outer quadrant of unspecified female breast: Secondary | ICD-10-CM

## 2012-10-28 DIAGNOSIS — Z170 Estrogen receptor positive status [ER+]: Secondary | ICD-10-CM

## 2012-10-28 DIAGNOSIS — Z09 Encounter for follow-up examination after completed treatment for conditions other than malignant neoplasm: Secondary | ICD-10-CM

## 2012-10-28 DIAGNOSIS — C50219 Malignant neoplasm of upper-inner quadrant of unspecified female breast: Secondary | ICD-10-CM

## 2012-10-28 DIAGNOSIS — I890 Lymphedema, not elsewhere classified: Secondary | ICD-10-CM

## 2012-10-28 LAB — CBC WITH DIFFERENTIAL/PLATELET
BASO%: 0.3 % (ref 0.0–2.0)
Basophils Absolute: 0 10*3/uL (ref 0.0–0.1)
EOS%: 1.3 % (ref 0.0–7.0)
Eosinophils Absolute: 0.1 10*3/uL (ref 0.0–0.5)
HCT: 37.6 % (ref 34.8–46.6)
HGB: 12.8 g/dL (ref 11.6–15.9)
LYMPH%: 12.3 % — ABNORMAL LOW (ref 14.0–49.7)
MCH: 30.7 pg (ref 25.1–34.0)
MCHC: 34 g/dL (ref 31.5–36.0)
MCV: 90.2 fL (ref 79.5–101.0)
MONO#: 0.4 10*3/uL (ref 0.1–0.9)
MONO%: 6.3 % (ref 0.0–14.0)
NEUT#: 5.5 10*3/uL (ref 1.5–6.5)
NEUT%: 79.8 % — ABNORMAL HIGH (ref 38.4–76.8)
Platelets: 261 10*3/uL (ref 145–400)
RBC: 4.17 10*6/uL (ref 3.70–5.45)
RDW: 13.4 % (ref 11.2–14.5)
WBC: 6.9 10*3/uL (ref 3.9–10.3)
lymph#: 0.8 10*3/uL — ABNORMAL LOW (ref 0.9–3.3)

## 2012-10-28 NOTE — Progress Notes (Signed)
Patient comes in for wound check, overall feeling better. No fever.  Mastectomy site clean, no re accumulation of fluid, no erythema  She has lymphedema sleeve on and no obvious swelling today.  Imp   Improved from infected seroma Mild lymphedema  Plan Continue antibiotic RTC 4-5 days Re-eval by PT for lymphedema

## 2012-10-28 NOTE — Progress Notes (Signed)
ID: Kelly [name]   DOB: 03-17-1961  MR#: [id number]  AOZ#:[id number]  PCP:  [name] GYN: Annamaria Boots, SU: Cyndia Bent OTHER MD: Antony Blackbird, Dossie Der   HISTORY OF PRESENT ILLNESS: The patient herself noted a change in her right breast. She brought it to her primary care physician, Dr. Ronalee Red attention, and diagnostic mammography was obtained at [facility] 05/05/2012. The breasts were found to be heterogeneously dense. There was a spiculated mass posteriorly and laterally to the nipple line in the right breast, and a second mass more medially. There was an asymmetric soft tissue density in the upper outer quadrant of the left breast, without any discrete mass identified. There was a cluster of microcalcifications medially in the left breast. Ultrasound of the right breast showed a lobulated mass measuring 2.3 cm and a second mass measuring 4.5 cm. There was also a third mass measuring 1.9 cm which was felt possibly to represent a lymph node. In the right axilla there was a suspicious lymph node measuring 1.1 cm.  Biopsy of this 3 breast masses was obtained the same day, and showed all 3 to represent invasive ductal carcinoma, grade 1. Because the 3 lesions were morphologically similar, only one prognostic panel was obtained (from the mass at "7:00"), showing it to be estrogen receptor 100% positive, progesterone receptor negative, with an MIB-1 of 12%, and no HER-2 amplification ([id number]).  Bilateral breast MRI was obtained 05/10/2012, and confirmed the 3 right breast masses in question, measuring 2.1, 2.8 (at the 7:00 position) and 2.2 cm. In addition a 1.3 cm right axillary lymph node was noted. There were no suspicious findings in the left breast. On October 30 fine-needle aspiration of the suspicious right axilla lymph node was performed, with the results being negative ([id number]).   Finally on 06/07/2012 the patient underwent right modified radical  mastectomy. The three masses in the right breast measured 2.2 cm, 3 cm, and 2.3 cm ([id number]). The 2.3 cm mass was morphologically slightly different (grade 2) and a prognostic profile was obtained from this mass ("superior mid-medial"). It was 100% estrogen and 98% progesterone receptor positive. MIB-1 was 16%. There was no HER-2 amplification. A total of 19 lymph nodes were sampled, of which 9 were positive. Margins were negative, but close (1 mm). The patient's subsequent history is as detailed below.  INTERVAL HISTORY: Damica returns today for followup of her breast cancer, accompanied by friends. Since her last visit here she completed her radiation treatments and underwent left mastectomy, unfortunately complicated by an infected seroma. She received intravenous vancomycin and is currently on oral antibiotics.  REVIEW OF SYSTEMS: She still has significant tenderness in the left breast area, but she takes Vicodin only at night and not every night. This does not constipate her. She tells me her appetite is poor, she feels mildly fatigued, she has chronic stress incontinence, and feels forgetful and somewhat depressed. A detailed review of systems today was otherwise negative  PAST MEDICAL HISTORY: Past Medical History  Diagnosis Date  . Anxiety   . Depression   . Breast cancer 05/05/12    right, ER +, PR -, Her 2 -  . Arthritis    sleep apnea; migraines;  PAST SURGICAL HISTORY: Past Surgical History  Procedure Laterality Date  . Cesarean section      x3  . Joint replacement      Rt and Lt thumbs  . Cervical conization w/bx    . Breast surgery  several biopsies, needle aspiration  . Mastectomy w/ sentinel node biopsy  06/07/2012    Procedure: MASTECTOMY WITH SENTINEL LYMPH NODE BIOPSY;  Surgeon: Currie Paris, MD;  Location: [facility] OR;  Service: General;  Laterality: Right;  right total mastectomy and sentinel node  . Mastectomy modified radical  06/07/2012    Procedure:  MASTECTOMY MODIFIED RADICAL;  Surgeon: Currie Paris, MD;  Location: [facility] OR;  Service: General;  Laterality: Right;  . Tubal ligation    . Simple mastectomy with axillary sentinel node biopsy Left 09/30/2012    Procedure: SIMPLE MASTECTOMY;  Surgeon: Currie Paris, MD;  Location: [facility];  Service: General;  Laterality: Left;  left total mastectomy    FAMILY HISTORY Family History  Problem Relation Age of Onset  . Breast cancer Mother 59  . Lymphoma Mother 31    non-hodgkins lymphoma in her epiglottis  . Cancer Mother     non-hodkins lymphoma, breast  . Stroke Maternal Grandfather   . Bipolar disorder Brother   . Gout Brother   . Breast cancer Other     MGM's sister; dx <50  . Breast cancer Other 40    MGF's sister  . Brain cancer Other     MGM's sister; dx <50  . Leukemia Other     mother's maternal cousin; dx under 10  the patient's mother, Britta Louth, is also my patient and has a history of breast cancer. The patient's father was adopted. Bristyl has 2 brothers, no sisters. There is no history of ovarian cancer in the family.  GYNECOLOGIC HISTORY: She had menarche age 70, first live birth age 79. She is GX P3. Her periods are regular.  SOCIAL HISTORY: Josely volunteers at the [employer], at church [facility]) and for other causes. Her husband Loraine Leriche                         . Their children are currently 65 years old (doing the [place] trail), 26 (freshman at [employer]), and 11.   ADVANCED DIRECTIVES: Not in place  HEALTH MAINTENANCE: History  Substance Use Topics  . Smoking status: Never Smoker   . Smokeless tobacco: Never Used  . Alcohol Use: Yes     Comment: very rare     Colonoscopy:  PAP:  Bone density:  Lipid panel:  No Known Allergies  Current Outpatient Prescriptions  Medication Sig Dispense Refill  . buPROPion (WELLBUTRIN XL) 150 MG 24 hr tablet Take 150 mg by mouth daily.       Marland Kitchen doxycycline  (VIBRA-TABS) 100 MG tablet Take 1 tablet (100 mg total) by mouth 2 (two) times daily.  28 tablet  1  . HYDROcodone-acetaminophen (NORCO) 5-325 MG per tablet Take 1-2 tablets by mouth every 6 (six) hours as needed for pain.  40 tablet  0  . tamoxifen (NOLVADEX) 20 MG tablet Take 20 mg by mouth daily.      . traZODone (DESYREL) 50 MG tablet Take 150 mg by mouth at bedtime. scheduled      . VIIBRYD 40 MG TABS Take 40 mg by mouth daily.        No current facility-administered medications for this visit.    OBJECTIVE: Middle-aged white woman who appears anxious Filed Vitals:   10/28/12 1603  BP: 100/65  Pulse: 76  Temp: 98.7 F (37.1 C)  Resp: 20     Body mass index is 19.82 kg/(m^2).    ECOG  FS: 1  Sclerae unicteric Oropharynx clear No cervical or supraclavicular adenopathy Lungs no rales or rhonchi Heart regular rate and rhythm Abd soft, nontender, positive bowel sounds MSK no focal spinal tenderness, no peripheral edema, right upper extremity compression sleeve in place Neuro: nonfocal, well oriented Breasts: The right breast is status post mastectomy and radiation. The skin changes from radiation have largely resolved. There is no evidence of local recurrence. The right axilla is benign. The left breast is status post mastectomy, with some swelling noted in the medial aspect of the incision, but no dehiscence or erythema. The left axilla is also benign.  LAB RESULTS: Lab Results  Component Value Date   WBC 6.9 10/28/2012   NEUTROABS 5.5 10/28/2012   HGB 12.8 10/28/2012   HCT 37.6 10/28/2012   MCV 90.2 10/28/2012   PLT 261 10/28/2012      Chemistry      Component Value Date/Time   NA 140 10/19/2012 0510   NA 142 08/05/2012 1454   K 3.9 10/19/2012 0510   K 4.3 08/05/2012 1454   CL 107 10/19/2012 0510   CL 102 08/05/2012 1454   CO2 24 10/19/2012 0510   CO2 30* 08/05/2012 1454   BUN 9 10/19/2012 0510   BUN 15.0 08/05/2012 1454   CREATININE 0.73 10/19/2012 0510   CREATININE 0.8 08/05/2012 1454       Component Value Date/Time   CALCIUM 7.9* 10/19/2012 0510   CALCIUM 8.8 08/05/2012 1454   ALKPHOS 87 10/18/2012 1952   ALKPHOS 74 08/05/2012 1454   AST 37 10/18/2012 1952   AST 16 08/05/2012 1454   ALT 25 10/18/2012 1952   ALT 9 08/05/2012 1454   BILITOT 1.0 10/18/2012 1952   BILITOT 0.43 08/05/2012 1454       Lab Results  Component Value Date   LABCA2 31 06/18/2012    No components found with this basename: LABCA125    No results found for this basename: INR,  in the last 168 hours  Urinalysis    Component Value Date/Time   COLORURINE YELLOW 10/18/2012 2035   APPEARANCEUR CLEAR 10/18/2012 2035   LABSPEC 1.011 10/18/2012 2035   PHURINE 7.5 10/18/2012 2035   GLUCOSEU NEGATIVE 10/18/2012 2035   HGBUR NEGATIVE 10/18/2012 2035   BILIRUBINUR NEGATIVE 10/18/2012 2035   KETONESUR NEGATIVE 10/18/2012 2035   PROTEINUR NEGATIVE 10/18/2012 2035   UROBILINOGEN 0.2 10/18/2012 2035   NITRITE NEGATIVE 10/18/2012 2035   LEUKOCYTESUR NEGATIVE 10/18/2012 2035    STUDIES: PET scan obtained at [facility] 05/14/2012 showed uptake only in the right breast area (SUV. of 4.1 and 2.9). In addition, a left pelvis area of uptake, poorly characterized, had an SUV of 3.8. This was felt likely to represent a loop of unopacified small bowel.  ASSESSMENT: 52 y.o. BRCA negative [place] woman status post right modified radical mastectomy 06/07/2012 for a multifocal invasive ductal carcinoma, the largest of 3 masses measuring 3.0 cm, grade 1; a second mass measuring 2.3 cm, grade 2; and a third mass measuring 2.2 cm, grade 1. The 2 larger masses were both 100% estrogen receptor positive, the grade 2 mass being also progesterone receptor positive at 98%, with an MIB-1 of 16% (the larger mass was estrogen receptor negative, with an Mib-1 of 12%). Both showed no HER-2 amplification. 9 of 19 axillary lymph nodes sampled were involved. In summary:  (1) right-sided mpT2 pN2a or stage IIIA invasive ductal  carcinoma, grade 1-2, estrogen receptor positive, HER-2 not amplified, with an  MIB-1 of 12-16%  (2) the patient opted to forego adjuvant chemotherapy  (3) adjuvant radiation therapy completed 09/03/2012   (4) on tamoxifen as of February 2014  (5) left simple mastectomy 09/30/2012, with benign pathology; complicated by an infected seroma  (6) at this point the patient is leaning against reconstruction    PLAN: We spent the better part of today's hour-long visit discussing monitoring. Aashritha had originally understood that we would be doing scans every 3 months together with extensive lab work including CA 27-29. I gave her a copy of the  NCCN followup guidelines. Lanell's preoperative CA 27-29 was 58, and it may make sense to repeat this, though she understands from our discussion today that the test can be normal in patients with obvious cancer and abnormal in patient's with no evidence of cancer despite extensive testing. She understands the risks of unnecessary radiation exposure and false positives. If we do document an abnormal result, we will repeat it after 6 weeks to make sure there is a trend before any staging studies are obtained.  I think she would benefit from a repeat PET scan once the postoperative changes from her left mastectomy and infection have settled down. Her original PET scan October 2013 at [facility] showed a poorly characterized area of uptake in the left pelvis. Hopefully this will prove to have been artifact. If it is normal, we will use it as our new baseline and I would not plan to repeat another scan until she completes the first 2 years of followup, since those are the most dangerous ones in terms of recurrence.  The patient has a good understanding of all this. She will have her PET scan on June 2 and see me the next day. The plan for now is to continue tamoxifen a minimum of 2 years and then reassess. She knows to call for any problems that may develop before the next  visit.      Allura Doepke C    10/28/2012

## 2012-10-28 NOTE — Patient Instructions (Signed)
Continue antibiotics See me again nearly next week

## 2012-10-29 ENCOUNTER — Telehealth: Payer: Self-pay | Admitting: Oncology

## 2012-11-01 ENCOUNTER — Encounter (INDEPENDENT_AMBULATORY_CARE_PROVIDER_SITE_OTHER): Payer: BC Managed Care – PPO | Admitting: Surgery

## 2012-11-02 ENCOUNTER — Other Ambulatory Visit (HOSPITAL_BASED_OUTPATIENT_CLINIC_OR_DEPARTMENT_OTHER): Payer: BC Managed Care – PPO | Admitting: Lab

## 2012-11-02 ENCOUNTER — Ambulatory Visit: Payer: BC Managed Care – PPO | Attending: Radiation Oncology | Admitting: Physical Therapy

## 2012-11-02 DIAGNOSIS — IMO0001 Reserved for inherently not codable concepts without codable children: Secondary | ICD-10-CM | POA: Insufficient documentation

## 2012-11-02 DIAGNOSIS — C50919 Malignant neoplasm of unspecified site of unspecified female breast: Secondary | ICD-10-CM

## 2012-11-02 DIAGNOSIS — M25519 Pain in unspecified shoulder: Secondary | ICD-10-CM | POA: Insufficient documentation

## 2012-11-02 DIAGNOSIS — M24519 Contracture, unspecified shoulder: Secondary | ICD-10-CM | POA: Insufficient documentation

## 2012-11-02 DIAGNOSIS — M6281 Muscle weakness (generalized): Secondary | ICD-10-CM | POA: Insufficient documentation

## 2012-11-02 DIAGNOSIS — C50911 Malignant neoplasm of unspecified site of right female breast: Secondary | ICD-10-CM

## 2012-11-02 LAB — CANCER ANTIGEN 27.29: CA 27.29: 24 U/mL (ref 0–39)

## 2012-11-03 ENCOUNTER — Ambulatory Visit (INDEPENDENT_AMBULATORY_CARE_PROVIDER_SITE_OTHER): Payer: BC Managed Care – PPO | Admitting: Surgery

## 2012-11-03 ENCOUNTER — Encounter (INDEPENDENT_AMBULATORY_CARE_PROVIDER_SITE_OTHER): Payer: Self-pay | Admitting: Surgery

## 2012-11-03 ENCOUNTER — Telehealth (INDEPENDENT_AMBULATORY_CARE_PROVIDER_SITE_OTHER): Payer: Self-pay

## 2012-11-03 VITALS — BP 92/66 | HR 75 | Temp 97.5°F | Ht 64.0 in | Wt 116.2 lb

## 2012-11-03 DIAGNOSIS — Z09 Encounter for follow-up examination after completed treatment for conditions other than malignant neoplasm: Secondary | ICD-10-CM

## 2012-11-03 MED ORDER — SULFAMETHOXAZOLE-TMP DS 800-160 MG PO TABS: 1.0000 | ORAL_TABLET | Freq: Two times a day (BID) | ORAL | Status: DC

## 2012-11-03 NOTE — Telephone Encounter (Signed)
Notified pt that Dr. Jamey Ripa and Dr. [name] have changed her antibiotic to Septra, and it has been sent to her pharmacy.

## 2012-11-03 NOTE — Patient Instructions (Signed)
See me again next week

## 2012-11-03 NOTE — Addendum Note (Signed)
Addended by: Currie Paris on: 11/03/2012 04:13 PM   Modules accepted: Orders

## 2012-11-03 NOTE — Progress Notes (Addendum)
He is here for followup of her infected left mastectomy seroma. She notes continued swelling and then she's developed a little bit more fluid. She's also had some symptoms in her hand with some problems and was told that it might be a reaction to her doxycycline antibiotic. She started working with physical therapy because she apparently is also developed a winged scapula on the right side which had not noticed previously. There is apparent today.  On exam her wound continues to show some mild edema of the skin. There is  fluid present and I aspirated 10 cc, it was clear.she has a little bit of residual erythema.  Impression: Stable  Plan: We'll continue antibiotics. We'll check with her medical oncologist about antibiotic choice and may switch her from doxycycline to Septra. She will need to comeback in a week or so to be sure there is no further fluid and infection is continuing to resolve.  Discussed with Dr [name] who thought the doxy might be making her skin extrasenstivie to sunlight and causing an issue with her hands.  We will switch her to septra to see if an improvement

## 2012-11-05 ENCOUNTER — Ambulatory Visit (INDEPENDENT_AMBULATORY_CARE_PROVIDER_SITE_OTHER): Payer: BC Managed Care – PPO | Admitting: General Surgery

## 2012-11-05 DIAGNOSIS — C50911 Malignant neoplasm of unspecified site of right female breast: Secondary | ICD-10-CM

## 2012-11-05 DIAGNOSIS — C50919 Malignant neoplasm of unspecified site of unspecified female breast: Secondary | ICD-10-CM

## 2012-11-05 NOTE — Patient Instructions (Signed)
Keep follow up with Dr Jamey Ripa

## 2012-11-05 NOTE — Progress Notes (Signed)
Patient comes in status post prophylactic left mastectomy to check incision for seroma. Incision was evaluated and found to be clean and intact. No drainage, redness or seroma found. Dr Luisa Hart was brought in to check the incision as well and he did not find a seroma. Follow up appt was made for patient in two weeks with Dr Jamey Ripa. She will call with any problems prior.

## 2012-11-09 ENCOUNTER — Encounter (INDEPENDENT_AMBULATORY_CARE_PROVIDER_SITE_OTHER): Payer: BC Managed Care – PPO | Admitting: Surgery

## 2012-11-16 ENCOUNTER — Ambulatory Visit: Payer: BC Managed Care – PPO | Admitting: Physical Therapy

## 2012-11-17 ENCOUNTER — Other Ambulatory Visit: Payer: BC Managed Care – PPO | Admitting: Lab

## 2012-11-17 ENCOUNTER — Ambulatory Visit: Payer: BC Managed Care – PPO | Admitting: Oncology

## 2012-11-19 ENCOUNTER — Encounter (INDEPENDENT_AMBULATORY_CARE_PROVIDER_SITE_OTHER): Payer: Self-pay | Admitting: Surgery

## 2012-11-19 ENCOUNTER — Ambulatory Visit (INDEPENDENT_AMBULATORY_CARE_PROVIDER_SITE_OTHER): Payer: BC Managed Care – PPO | Admitting: Surgery

## 2012-11-19 VITALS — BP 116/72 | HR 72 | Temp 98.4°F | Resp 14 | Ht 64.0 in | Wt 118.2 lb

## 2012-11-19 DIAGNOSIS — Z09 Encounter for follow-up examination after completed treatment for conditions other than malignant neoplasm: Secondary | ICD-10-CM

## 2012-11-19 NOTE — Progress Notes (Signed)
NAME: Kelly [name]                                            DOB: 1961/02/24 DATE: 11/19/2012                                                  MRN: [id number]  CC:  Chief Complaint  Patient presents with  . Routine Post Op    reck mastectomy LOV 11/03/12, sx 09/30/12    HPI: This patient comes in for post op follow-up .Sheunderwent left mastectomy, developed post op infected seroma which was drained and she is in for follow up. She feels that she is doing well.She notes that the skin is more prominent on the left thant the right.  PE:  VITAL SIGNS: BP 116/72  Pulse 72  Temp(Src) 98.4 F (36.9 C) (Oral)  Resp 14  Ht 5\' 4"  (1.626 m)  Wt 118 lb 3.2 oz (53.615 kg)  BMI 20.28 kg/m2  General: The patient appears to be healthy, NAD Breast. Left well healed s/p mastectomy with infectred seroma. There remains either some excess skin or residual edema from the infection   IMPRESSION: The patient is doing well S/P mastectomy.    PLAN: See me in two months for follow up

## 2012-11-19 NOTE — Patient Instructions (Signed)
See me again in two months 

## 2012-12-21 ENCOUNTER — Telehealth: Payer: Self-pay | Admitting: *Deleted

## 2012-12-27 ENCOUNTER — Other Ambulatory Visit (HOSPITAL_COMMUNITY): Payer: Self-pay | Admitting: Oncology

## 2012-12-27 ENCOUNTER — Encounter (HOSPITAL_COMMUNITY)
Admission: RE | Admit: 2012-12-27 | Discharge: 2012-12-27 | Disposition: A | Discharge status: Home / Self Care | Payer: BC Managed Care – PPO | Source: Ambulatory Visit | Attending: Oncology | Admitting: Oncology

## 2012-12-27 ENCOUNTER — Other Ambulatory Visit (HOSPITAL_BASED_OUTPATIENT_CLINIC_OR_DEPARTMENT_OTHER): Payer: BC Managed Care – PPO | Admitting: Lab

## 2012-12-27 DIAGNOSIS — C50911 Malignant neoplasm of unspecified site of right female breast: Secondary | ICD-10-CM

## 2012-12-27 DIAGNOSIS — C50919 Malignant neoplasm of unspecified site of unspecified female breast: Secondary | ICD-10-CM

## 2012-12-27 DIAGNOSIS — Z9010 Acquired absence of unspecified breast and nipple: Secondary | ICD-10-CM | POA: Insufficient documentation

## 2012-12-27 LAB — COMPREHENSIVE METABOLIC PANEL (CC13)
ALT: 12 U/L (ref 0–55)
AST: 19 U/L (ref 5–34)
Albumin: 3.6 g/dL (ref 3.5–5.0)
Alkaline Phosphatase: 71 U/L (ref 40–150)
BUN: 13.2 mg/dL (ref 7.0–26.0)
CO2: 31 mEq/L — ABNORMAL HIGH (ref 22–29)
Calcium: 9.3 mg/dL (ref 8.4–10.4)
Chloride: 102 mEq/L (ref 98–107)
Creatinine: 0.9 mg/dL (ref 0.6–1.1)
Glucose: 87 mg/dl (ref 70–99)
Potassium: 4.2 mEq/L (ref 3.5–5.1)
Sodium: 141 mEq/L (ref 136–145)
Total Bilirubin: 1.01 mg/dL (ref 0.20–1.20)
Total Protein: 6.9 g/dL (ref 6.4–8.3)

## 2012-12-27 LAB — CBC WITH DIFFERENTIAL/PLATELET
BASO%: 0.7 % (ref 0.0–2.0)
Basophils Absolute: 0 10*3/uL (ref 0.0–0.1)
EOS%: 1.7 % (ref 0.0–7.0)
Eosinophils Absolute: 0.1 10*3/uL (ref 0.0–0.5)
HCT: 40.3 % (ref 34.8–46.6)
HGB: 13.7 g/dL (ref 11.6–15.9)
LYMPH%: 10.9 % — ABNORMAL LOW (ref 14.0–49.7)
MCH: 30.6 pg (ref 25.1–34.0)
MCHC: 34 g/dL (ref 31.5–36.0)
MCV: 89.8 fL (ref 79.5–101.0)
MONO#: 0.3 10*3/uL (ref 0.1–0.9)
MONO%: 7.7 % (ref 0.0–14.0)
NEUT#: 3.5 10*3/uL (ref 1.5–6.5)
NEUT%: 79 % — ABNORMAL HIGH (ref 38.4–76.8)
Platelets: 241 10*3/uL (ref 145–400)
RBC: 4.48 10*6/uL (ref 3.70–5.45)
RDW: 13.3 % (ref 11.2–14.5)
WBC: 4.5 10*3/uL (ref 3.9–10.3)
lymph#: 0.5 10*3/uL — ABNORMAL LOW (ref 0.9–3.3)

## 2012-12-27 LAB — GLUCOSE, CAPILLARY: Glucose-Capillary: 76 mg/dL (ref 70–99)

## 2012-12-27 LAB — CANCER ANTIGEN 27.29: CA 27.29: 19 U/mL (ref 0–39)

## 2012-12-27 MED ORDER — FLUDEOXYGLUCOSE F - 18 (FDG) INJECTION
18.8000 | Freq: Once | INTRAVENOUS | Status: AC | PRN
  Administered 2012-12-27: 18.8 via INTRAVENOUS

## 2012-12-28 ENCOUNTER — Ambulatory Visit (HOSPITAL_BASED_OUTPATIENT_CLINIC_OR_DEPARTMENT_OTHER): Payer: BC Managed Care – PPO | Admitting: Oncology

## 2012-12-28 VITALS — BP 100/65 | HR 76 | Temp 98.5°F | Resp 20 | Ht 64.0 in | Wt 113.4 lb

## 2012-12-28 DIAGNOSIS — C50519 Malignant neoplasm of lower-outer quadrant of unspecified female breast: Secondary | ICD-10-CM

## 2012-12-28 DIAGNOSIS — C50911 Malignant neoplasm of unspecified site of right female breast: Secondary | ICD-10-CM

## 2012-12-28 DIAGNOSIS — C50419 Malignant neoplasm of upper-outer quadrant of unspecified female breast: Secondary | ICD-10-CM

## 2012-12-28 DIAGNOSIS — C50219 Malignant neoplasm of upper-inner quadrant of unspecified female breast: Secondary | ICD-10-CM

## 2012-12-28 DIAGNOSIS — C773 Secondary and unspecified malignant neoplasm of axilla and upper limb lymph nodes: Secondary | ICD-10-CM

## 2012-12-28 NOTE — Progress Notes (Signed)
ID: Kelly [name]   DOB: 08-May-1961  MR#: [id number]  WUJ#:[id number]  PCP:  Kelly [name] GYN: Kelly [name], SU: Kelly [name] OTHER MD: Kelly [name], Kelly [name]   HISTORY OF PRESENT ILLNESS: The patient herself noted a change in her right breast. She brought it to her primary care physician, Kelly [name] attention, and diagnostic mammography was obtained at [name] 05/05/2012. The breasts were found to be heterogeneously dense. There was a spiculated mass posteriorly and laterally to the nipple line in the right breast, and a second mass more medially. There was an asymmetric soft tissue density in the upper outer quadrant of the left breast, without any discrete mass identified. There was a cluster of microcalcifications medially in the left breast. Ultrasound of the right breast showed a lobulated mass measuring 2.3 cm and a second mass measuring 4.5 cm. There was also a third mass measuring 1.9 cm which was felt possibly to represent a lymph node. In the right axilla there was a suspicious lymph node measuring 1.1 cm.  Biopsy of this 3 breast masses was obtained the same day, and showed all 3 to represent invasive ductal carcinoma, grade 1. Because the 3 lesions were morphologically similar, only one prognostic panel was obtained (from the mass at "7:00"), showing it to be estrogen receptor 100% positive, progesterone receptor negative, with an MIB-1 of 12%, and no HER-2 amplification ([id number]).  Bilateral breast MRI was obtained 05/10/2012, and confirmed the 3 right breast masses in question, measuring 2.1, 2.8 (at the 7:00 position) and 2.2 cm. In addition a 1.3 cm right axillary lymph node was noted. There were no suspicious findings in the left breast. On October 30 fine-needle aspiration of the suspicious right axilla lymph node was performed, with the results being negative ([id number]).   Finally on 06/07/2012 the patient underwent right modified radical  mastectomy. The three masses in the right breast measured 2.2 cm, 3 cm, and 2.3 cm ([id number]). The 2.3 cm mass was morphologically slightly different (grade 2) and a prognostic profile was obtained from this mass ("superior mid-medial"). It was 100% estrogen and 98% progesterone receptor positive. MIB-1 was 16%. There was no HER-2 amplification. A total of 19 lymph nodes were sampled, of which 9 were positive. Margins were negative, but close (1 mm). The patient's subsequent history is as detailed below.  INTERVAL HISTORY: Kelly [name] today for followup of her breast cancer accompanied by a friend. Since her last visit here she started tamoxifen. So far she is tolerating it well, with no side effects of concern, and particularly no problems with hot flashes.  REVIEW OF SYSTEMS: She is having a sense of pressure in her lower pelvis, associated with urinary frequency. There is no dysuria. She brought this to her primary care physician and a urinalysis was obtained, which she tells me was normal. She has a little bit of back pain sometimes associated with this. Otherwise a detailed review of systems was noncontributory  PAST MEDICAL HISTORY: Past Medical History  Diagnosis Date  . Anxiety   . Depression   . Breast cancer 05/05/12    right, ER +, PR -, Her 2 -  . Arthritis    sleep apnea; migraines;  PAST SURGICAL HISTORY: Past Surgical History  Procedure Laterality Date  . Cesarean section      x3  . Joint replacement      Rt and Lt thumbs  . Cervical conization w/bx    . Breast surgery  several biopsies, needle aspiration  . Mastectomy w/ sentinel node biopsy  06/07/2012    Procedure: MASTECTOMY WITH SENTINEL LYMPH NODE BIOPSY;  Surgeon: Kelly Paris, MD;  Location: [facility] OR;  Service: General;  Laterality: Right;  right total mastectomy and sentinel node  . Mastectomy modified radical  06/07/2012    Procedure: MASTECTOMY MODIFIED RADICAL;  Surgeon: Kelly Paris, MD;   Location: [facility] OR;  Service: General;  Laterality: Right;  . Tubal ligation    . Simple mastectomy with axillary sentinel node biopsy Left 09/30/2012    Procedure: SIMPLE MASTECTOMY;  Surgeon: Kelly Paris, MD;  Location: [facility];  Service: General;  Laterality: Left;  left total mastectomy    FAMILY HISTORY Family History  Problem Relation Age of Onset  . Breast cancer Mother 44  . Lymphoma Mother 71    non-hodgkins lymphoma in her epiglottis  . Cancer Mother     non-hodkins lymphoma, breast  . Stroke Maternal Grandfather   . Bipolar disorder Brother   . Gout Brother   . Breast cancer Other     MGM's sister; dx <50  . Breast cancer Other 40    MGF's sister  . Brain cancer Other     MGM's sister; dx <50  . Leukemia Other     mother's maternal cousin; dx under 10  the patient's mother, Kelly [name], is also my patient and has a history of breast cancer. The patient's father was adopted. Kelly [name] has 2 brothers, no sisters. There is no history of ovarian cancer in the family.  GYNECOLOGIC HISTORY: She had menarche age 54, first live birth age 72. She is GX P3. Her periods are regular.  SOCIAL HISTORY: Kelly [name] volunteers at the [employer], at church Kelly [name]) and for other causes. Her husband Kelly [name]                         . Their children are currently 38 years old (doing the [place] trail), 54 (freshman at [employer]), and 11.   ADVANCED DIRECTIVES: Not in place  HEALTH MAINTENANCE: History  Substance Use Topics  . Smoking status: Never Smoker   . Smokeless tobacco: Never Used  . Alcohol Use: Yes     Comment: very rare     Colonoscopy:  PAP:  Bone density:  Lipid panel:  Allergies  Allergen Reactions  . Doxycycline Rash    Current Outpatient Prescriptions  Medication Sig Dispense Refill  . buPROPion (WELLBUTRIN XL) 150 MG 24 hr tablet Take 150 mg by mouth daily.       . tamoxifen (NOLVADEX) 20 MG tablet Take  20 mg by mouth daily.      . traZODone (DESYREL) 50 MG tablet Take 150 mg by mouth at bedtime. scheduled      . VIIBRYD 40 MG TABS Take 40 mg by mouth daily.        No current facility-administered medications for this visit.    OBJECTIVE: Middle-aged white woman in no acute distress Filed Vitals:   12/28/12 1609  BP: 100/65  Pulse: 76  Temp: 98.5 F (36.9 C)  Resp: 20     Body mass index is 19.46 kg/(m^2).    ECOG FS: 0  Sclerae unicteric Oropharynx clear No cervical or supraclavicular adenopathy Lungs no rales or rhonchi Heart regular rate and rhythm Abd soft, nontender, positive bowel sounds, pelvis unremarkable to palpation MSK no focal spinal tenderness, no peripheral edema Neuro: nonfocal,  well oriented, friendly affect Breasts: The right breast is status post mastectomy and radiation.  There is no evidence of local recurrence. The right axilla is benign. The left breast is status post mastectomy. The left axilla is benign.  LAB RESULTS: Lab Results  Component Value Date   WBC 4.5 12/27/2012   NEUTROABS 3.5 12/27/2012   HGB 13.7 12/27/2012   HCT 40.3 12/27/2012   MCV 89.8 12/27/2012   PLT 241 12/27/2012      Chemistry      Component Value Date/Time   NA 141 12/27/2012 0935   NA 140 10/19/2012 0510   K 4.2 12/27/2012 0935   K 3.9 10/19/2012 0510   CL 102 12/27/2012 0935   CL 107 10/19/2012 0510   CO2 31* 12/27/2012 0935   CO2 24 10/19/2012 0510   BUN 13.2 12/27/2012 0935   BUN 9 10/19/2012 0510   CREATININE 0.9 12/27/2012 0935   CREATININE 0.73 10/19/2012 0510      Component Value Date/Time   CALCIUM 9.3 12/27/2012 0935   CALCIUM 7.9* 10/19/2012 0510   ALKPHOS 71 12/27/2012 0935   ALKPHOS 87 10/18/2012 1952   AST 19 12/27/2012 0935   AST 37 10/18/2012 1952   ALT 12 12/27/2012 0935   ALT 25 10/18/2012 1952   BILITOT 1.01 12/27/2012 0935   BILITOT 1.0 10/18/2012 1952       Lab Results  Component Value Date   LABCA2 19 12/27/2012    No components found with this basename: LABCA125     No results found for this basename: INR,  in the last 168 hours  Urinalysis    Component Value Date/Time   COLORURINE YELLOW 10/18/2012 2035   APPEARANCEUR CLEAR 10/18/2012 2035   LABSPEC 1.011 10/18/2012 2035   PHURINE 7.5 10/18/2012 2035   GLUCOSEU NEGATIVE 10/18/2012 2035   HGBUR NEGATIVE 10/18/2012 2035   BILIRUBINUR NEGATIVE 10/18/2012 2035   KETONESUR NEGATIVE 10/18/2012 2035   PROTEINUR NEGATIVE 10/18/2012 2035   UROBILINOGEN 0.2 10/18/2012 2035   NITRITE NEGATIVE 10/18/2012 2035   LEUKOCYTESUR NEGATIVE 10/18/2012 2035    STUDIES: Nm Pet Image Restag (ps) Skull Base To Thigh  12/27/2012   *RADIOLOGY REPORT*  Clinical Data: Subsequent treatment strategy for breast cancer.  NUCLEAR MEDICINE PET SKULL BASE TO THIGH  Fasting Blood Glucose:  76  Technique:  18.8 mCi F-18 FDG was injected intravenously. CT data was obtained and used for attenuation correction and anatomic localization only.  (This was not acquired as a diagnostic CT examination.) Additional exam technical data entered on technologist worksheet.  Comparison:  None  Findings:  Neck: No hypermetabolic lymph nodes in the neck.  Chest:  No hypermetabolic mediastinal or hilar nodes.  No suspicious pulmonary nodules on the CT scan. Surgical changes in the right axilla and from bilateral mastectomies but no findings for axillary adenopathy or recurrent chest wall disease.  Low level FDG activity near the cluster of right axillary surgical clips but no discrete tumor and the SUV Max is 2.8.  Abdomen/Pelvis:  No abnormal hypermetabolic activity within the liver, pancreas, adrenal glands, or spleen.  No hypermetabolic lymph nodes in the abdomen or pelvis.  Skeleton:  No focal hypermetabolic activity to suggest skeletal metastasis.  IMPRESSION:  1.  Surgical changes related to bilateral mastectomies. 2.  Low level FDG activity near the cluster of right axillary surgical clips but no discrete tumor and the SUV Max is 2.8. Recommend clinical  follow-up. 3.  No findings to suggest metastatic disease involving  the chest, abdomen or pelvis.   Original Report Authenticated By: Rudie Meyer, M.D.   ASSESSMENT: 52 y.o. BRCA negative [facility] woman status post right modified radical mastectomy 06/07/2012 for a multifocal invasive ductal carcinoma, the largest of 3 masses measuring 3.0 cm, grade 1; a second mass measuring 2.3 cm, grade 2; and a third mass measuring 2.2 cm, grade 1. The 2 larger masses were both 100% estrogen receptor positive, the grade 2 mass being also progesterone receptor positive at 98%, with an MIB-1 of 16% (the larger mass was estrogen receptor negative, with an Mib-1 of 12%). Both showed no HER-2 amplification. 9 of 19 axillary lymph nodes sampled were involved. In summary:  (1) right-sided mpT2 pN2a or stage IIIA invasive ductal carcinoma, grade 1-2, estrogen receptor positive, HER-2 not amplified, with an MIB-1 of 12-16%  (2) the patient opted to forego adjuvant chemotherapy  (3) adjuvant radiation therapy completed 09/03/2012   (4) on tamoxifen as of February 2014  (5) left simple mastectomy 09/30/2012, with benign pathology; complicated by an infected seroma  (6) no reconstruction planned   PLAN: Moranda is doing well as far as her breast cancer is concerned, with no evidence of disease activity, and a very favorable PET scan report. We are not planning to repeat radiologic studies in the absence of symptoms of until the 2 year mark counting from her definitive surgery. She is tolerating the tamoxifen well and will continue that at least 2 years before deciding whether or not to switch.  She is having what sounds to me like a spastic bladder. We will ask urology to evaluate further. Otherwise she knows to call for any problems that may develop before the next visit.      MAGRINAT,GUSTAV C    12/28/2012

## 2012-12-29 ENCOUNTER — Telehealth: Payer: Self-pay | Admitting: Oncology

## 2012-12-29 ENCOUNTER — Telehealth: Payer: Self-pay | Admitting: *Deleted

## 2012-12-29 NOTE — Telephone Encounter (Signed)
Lm informing the pt that she is scheduled for 03/24/13@ 3:30pm for a lab only and ov on 03/31/13@ 3:15pm. i also made her aware that she has been scheduled to see Dr Sherron Monday on 02/01/13@ 12:45pm, and that his office will mail here some information regarding her appts and there location. Pt is aware that i also will be mailing her a letter/cal as well...td

## 2012-12-29 NOTE — Telephone Encounter (Signed)
Faxed pt medical records to Dr. Sherron Monday

## 2013-01-11 ENCOUNTER — Encounter (INDEPENDENT_AMBULATORY_CARE_PROVIDER_SITE_OTHER): Payer: BC Managed Care – PPO | Admitting: Surgery

## 2013-01-17 ENCOUNTER — Ambulatory Visit: Payer: Self-pay | Admitting: Specialist

## 2013-01-24 NOTE — Progress Notes (Signed)
Had individual counseling session with Pt. On 01-17-13  Kandy Garrison, M.Ed., NCC

## 2013-01-27 ENCOUNTER — Encounter (INDEPENDENT_AMBULATORY_CARE_PROVIDER_SITE_OTHER): Payer: BC Managed Care – PPO | Admitting: Surgery

## 2013-03-04 ENCOUNTER — Other Ambulatory Visit: Payer: Self-pay | Admitting: Oncology

## 2013-03-15 ENCOUNTER — Ambulatory Visit (INDEPENDENT_AMBULATORY_CARE_PROVIDER_SITE_OTHER): Payer: BC Managed Care – PPO | Admitting: Surgery

## 2013-03-15 ENCOUNTER — Encounter (INDEPENDENT_AMBULATORY_CARE_PROVIDER_SITE_OTHER): Payer: Self-pay | Admitting: Surgery

## 2013-03-15 VITALS — BP 98/64 | HR 62 | Resp 14 | Ht 64.0 in | Wt 122.6 lb

## 2013-03-15 DIAGNOSIS — Z853 Personal history of malignant neoplasm of breast: Secondary | ICD-10-CM

## 2013-03-15 NOTE — Progress Notes (Signed)
followup bilateral mastectomies  History of present illness:Blood pressure 98/64, pulse 62, resp. rate 14, height 5\' 4"  (1.626 m), weight 122 lb 9.6 oz (55.611 kg). this patient comes in for followup. She underwent a right mastectomy for breast cancer and a subsequent left prophylactic mastectomy. She overall feels she is doing very well. She feels her range of motion is improved. She is not having a significant pain. She is planning to see a Engineer, petroleum for reconstruction.  Exam: Vital signs:BP 98/64  Pulse 62  Resp 14  Ht 5\' 4"  (1.626 m)  Wt 122 lb 9.6 oz (55.611 kg)  BMI 21.03 kg/m2 Gen.: Patient alert oriented healthy-appearing Breasts: Status post bilateral mastectomies. Wounds are well-healed. No evidence of local recurrences or other problems. Extremities: No evidence of significant restriction range of motion  Impression: Doing well  Plan: I will see in about 4 months, after she's had reconstruction

## 2013-03-15 NOTE — Patient Instructions (Signed)
See me in December

## 2013-03-24 ENCOUNTER — Other Ambulatory Visit: Payer: Self-pay | Admitting: *Deleted

## 2013-03-24 ENCOUNTER — Other Ambulatory Visit (HOSPITAL_BASED_OUTPATIENT_CLINIC_OR_DEPARTMENT_OTHER): Payer: BC Managed Care – PPO | Admitting: Lab

## 2013-03-24 ENCOUNTER — Telehealth: Payer: Self-pay | Admitting: Oncology

## 2013-03-24 DIAGNOSIS — C50911 Malignant neoplasm of unspecified site of right female breast: Secondary | ICD-10-CM

## 2013-03-24 DIAGNOSIS — C50919 Malignant neoplasm of unspecified site of unspecified female breast: Secondary | ICD-10-CM

## 2013-03-24 LAB — CBC WITH DIFFERENTIAL/PLATELET
BASO%: 0.3 % (ref 0.0–2.0)
Basophils Absolute: 0 10*3/uL (ref 0.0–0.1)
EOS%: 1 % (ref 0.0–7.0)
Eosinophils Absolute: 0.1 10*3/uL (ref 0.0–0.5)
HCT: 40.4 % (ref 34.8–46.6)
HGB: 13.9 g/dL (ref 11.6–15.9)
LYMPH%: 10.9 % — ABNORMAL LOW (ref 14.0–49.7)
MCH: 31.3 pg (ref 25.1–34.0)
MCHC: 34.4 g/dL (ref 31.5–36.0)
MCV: 90.8 fL (ref 79.5–101.0)
MONO#: 0.5 10*3/uL (ref 0.1–0.9)
MONO%: 6.8 % (ref 0.0–14.0)
NEUT#: 5.7 10*3/uL (ref 1.5–6.5)
NEUT%: 81 % — ABNORMAL HIGH (ref 38.4–76.8)
Platelets: 219 10*3/uL (ref 145–400)
RBC: 4.45 10*6/uL (ref 3.70–5.45)
RDW: 13.7 % (ref 11.2–14.5)
WBC: 7 10*3/uL (ref 3.9–10.3)
lymph#: 0.8 10*3/uL — ABNORMAL LOW (ref 0.9–3.3)

## 2013-03-24 LAB — COMPREHENSIVE METABOLIC PANEL (CC13)
ALT: 16 U/L (ref 0–55)
AST: 20 U/L (ref 5–34)
Albumin: 3.5 g/dL (ref 3.5–5.0)
Alkaline Phosphatase: 80 U/L (ref 40–150)
BUN: 16.6 mg/dL (ref 7.0–26.0)
CO2: 31 mEq/L — ABNORMAL HIGH (ref 22–29)
Calcium: 8.9 mg/dL (ref 8.4–10.4)
Chloride: 103 mEq/L (ref 98–109)
Creatinine: 0.8 mg/dL (ref 0.6–1.1)
Glucose: 125 mg/dl (ref 70–140)
Potassium: 4 mEq/L (ref 3.5–5.1)
Sodium: 143 mEq/L (ref 136–145)
Total Bilirubin: 0.67 mg/dL (ref 0.20–1.20)
Total Protein: 6.7 g/dL (ref 6.4–8.3)

## 2013-03-24 NOTE — Telephone Encounter (Signed)
Pt came by and change the appt to September 2014

## 2013-03-25 ENCOUNTER — Telehealth: Payer: Self-pay | Admitting: *Deleted

## 2013-03-25 LAB — CANCER ANTIGEN 27.29: CA 27.29: 16 U/mL (ref 0–39)

## 2013-03-25 NOTE — Telephone Encounter (Signed)
Returned pt call making her aware that on 9/8 Amy does not have an early openings...td

## 2013-03-31 ENCOUNTER — Ambulatory Visit: Payer: BC Managed Care – PPO | Admitting: Physician Assistant

## 2013-04-04 ENCOUNTER — Telehealth: Payer: Self-pay | Admitting: Oncology

## 2013-04-04 ENCOUNTER — Encounter: Payer: Self-pay | Admitting: Physician Assistant

## 2013-04-04 ENCOUNTER — Ambulatory Visit (HOSPITAL_BASED_OUTPATIENT_CLINIC_OR_DEPARTMENT_OTHER): Payer: BC Managed Care – PPO | Admitting: Physician Assistant

## 2013-04-04 VITALS — BP 119/78 | HR 75 | Temp 98.4°F | Resp 20 | Ht 64.0 in | Wt 113.3 lb

## 2013-04-04 DIAGNOSIS — C50419 Malignant neoplasm of upper-outer quadrant of unspecified female breast: Secondary | ICD-10-CM

## 2013-04-04 DIAGNOSIS — Z170 Estrogen receptor positive status [ER+]: Secondary | ICD-10-CM

## 2013-04-04 DIAGNOSIS — C50519 Malignant neoplasm of lower-outer quadrant of unspecified female breast: Secondary | ICD-10-CM

## 2013-04-04 DIAGNOSIS — C50219 Malignant neoplasm of upper-inner quadrant of unspecified female breast: Secondary | ICD-10-CM

## 2013-04-04 DIAGNOSIS — G4700 Insomnia, unspecified: Secondary | ICD-10-CM

## 2013-04-04 DIAGNOSIS — C50911 Malignant neoplasm of unspecified site of right female breast: Secondary | ICD-10-CM

## 2013-04-04 MED ORDER — TAMOXIFEN CITRATE 20 MG PO TABS: 20.0000 mg | ORAL_TABLET | Freq: Every day | ORAL | Status: DC

## 2013-04-04 MED ORDER — TRAZODONE HCL 50 MG PO TABS: 150.0000 mg | ORAL_TABLET | Freq: Every day | ORAL | Status: DC

## 2013-04-04 NOTE — Telephone Encounter (Signed)
, °

## 2013-04-04 NOTE — Progress Notes (Signed)
ID: Kelly [name]   DOB: February 28, 1961  MR#: [id number]  ONG#:[id number]  PCP:  Kelly [name] GYN: Kelly [name], SU: Kelly [name] OTHER MD: Kelly [name], Kelly [name], Kelly [name]   HISTORY OF PRESENT ILLNESS: The patient herself noted a change in her right breast. She brought it to her primary care physician, Dr. Ronalee [name] attention, and diagnostic mammography was obtained at [facility] 05/05/2012. The breasts were found to be heterogeneously dense. There was a spiculated mass posteriorly and laterally to the nipple line in the right breast, and a second mass more medially. There was an asymmetric soft tissue density in the upper outer quadrant of the left breast, without any discrete mass identified. There was a cluster of microcalcifications medially in the left breast. Ultrasound of the right breast showed a lobulated mass measuring 2.3 cm and a second mass measuring 4.5 cm. There was also a third mass measuring 1.9 cm which was felt possibly to represent a lymph node. In the right axilla there was a suspicious lymph node measuring 1.1 cm.  Biopsy of this 3 breast masses was obtained the same day, and showed all 3 to represent invasive ductal carcinoma, grade 1. Because the 3 lesions were morphologically similar, only one prognostic panel was obtained (from the mass at "7:00"), showing it to be estrogen receptor 100% positive, progesterone receptor negative, with an MIB-1 of 12%, and no HER-2 amplification ([id number]).  Bilateral breast MRI was obtained 05/10/2012, and confirmed the 3 right breast masses in question, measuring 2.1, 2.8 (at the 7:00 position) and 2.2 cm. In addition a 1.3 cm right axillary lymph node was noted. There were no suspicious findings in the left breast. On October 30 fine-needle aspiration of the suspicious right axilla lymph node was performed, with the results being negative ([id number]).   Finally on 06/07/2012 the patient underwent right modified  radical mastectomy. The three masses in the right breast measured 2.2 cm, 3 cm, and 2.3 cm ([id number]). The 2.3 cm mass was morphologically slightly different (grade 2) and a prognostic profile was obtained from this mass ("superior mid-medial"). It was 100% estrogen and 98% progesterone receptor positive. MIB-1 was 16%. There was no HER-2 amplification. A total of 19 lymph nodes were sampled, of which 9 were positive. Margins were negative, but close (1 mm). The patient's subsequent history is as detailed below.  INTERVAL HISTORY: Kelly [name] returns alone today for followup of her right breast cancer. She was prescribed tamoxifen in February 2014. She is also using some alternative methods including a "hydrogen peroxide" treatment, in which she drinks distilled water with several drops of "food grade hydrogen peroxide" 3 times daily. She tells me her energy level has increased significantly, and she is feeling "great".   REVIEW OF SYSTEMS: Kelly [name] has only occasional mild hot flashes, but denies any recent illnesses and has had no fevers or chills. She does have some insomnia for which she takes trazodone at night. She denies any skin changes, rashes, abnormal bruising, or abnormal bleeding. She's eating and drinking well denies any nausea or change in bowel or bladder habits. She's had no cough, shortness of breath, or chest pain. She denies any abnormal headaches, change in vision, or dizziness. She has some occasional lower back pain, especially when she exercises. Otherwise, she denies any unusual myalgias, arthralgias, or bony pain, and has had no problems at all with peripheral swelling.   Otherwise a detailed review of systems was noncontributory  PAST MEDICAL HISTORY: Past Medical  History  Diagnosis Date  . Anxiety   . Depression   . Breast cancer 05/05/12    right, ER +, PR -, Her 2 -  . Arthritis    sleep apnea; migraines;  PAST SURGICAL HISTORY: Past Surgical History  Procedure  Laterality Date  . Cesarean section      x3  . Joint replacement      Rt and Lt thumbs  . Cervical conization w/bx    . Breast surgery      several biopsies, needle aspiration  . Mastectomy w/ sentinel node biopsy  06/07/2012    Procedure: MASTECTOMY WITH SENTINEL LYMPH NODE BIOPSY;  Surgeon: Currie Paris, MD;  Location: [facility] OR;  Service: General;  Laterality: Right;  right total mastectomy and sentinel node  . Mastectomy modified radical  06/07/2012    Procedure: MASTECTOMY MODIFIED RADICAL;  Surgeon: Currie Paris, MD;  Location: [facility] OR;  Service: General;  Laterality: Right;  . Tubal ligation    . Simple mastectomy with axillary sentinel node biopsy Left 09/30/2012    Procedure: SIMPLE MASTECTOMY;  Surgeon: Currie Paris, MD;  Location: [facility];  Service: General;  Laterality: Left;  left total mastectomy    FAMILY HISTORY Family History  Problem Relation Age of Onset  . Breast cancer Mother 52  . Lymphoma Mother 56    non-hodgkins lymphoma in her epiglottis  . Cancer Mother     non-hodkins lymphoma, breast  . Stroke Maternal Grandfather   . Bipolar disorder Brother   . Gout Brother   . Breast cancer Other     MGM's sister; dx <50  . Breast cancer Other 40    MGF's sister  . Brain cancer Other     MGM's sister; dx <50  . Leukemia Other     mother's maternal cousin; dx under 10  the patient's mother, Kelly [name], is also my patient and has a history of breast cancer. The patient's father was adopted. Kelly [name] has 2 brothers, no sisters. There is no history of ovarian cancer in the family.  GYNECOLOGIC HISTORY: She had menarche age 57, first live birth age 52. She is GX P3. Her periods are regular.  SOCIAL HISTORY: Kelly [name] volunteers at the [employer], at church [facility]) and for other causes. Her husband Kelly [name]                         . Their children are currently 90 years old (doing the [place] trail), 67 (freshman  at [employer]), and 11.   ADVANCED DIRECTIVES: Not in place  HEALTH MAINTENANCE: History  Substance Use Topics  . Smoking status: Never Smoker   . Smokeless tobacco: Never Used  . Alcohol Use: Yes     Comment: very rare    Colonoscopy:  PAP:  Bone density: 08/11/2012 at [facility], osteopenia  Lipid panel: August 2013, Dr. Donna Bernard  Allergies  Allergen Reactions  . Doxycycline Rash    Current Outpatient Prescriptions  Medication Sig Dispense Refill  . amphetamine-dextroamphetamine (ADDERALL XR) 30 MG 24 hr capsule       . buPROPion (WELLBUTRIN XL) 150 MG 24 hr tablet Take 150 mg by mouth daily.       . cholecalciferol (VITAMIN D) 1000 UNITS tablet Take 1,000 Units by mouth daily.      . tamoxifen (NOLVADEX) 20 MG tablet Take 1 tablet (20 mg total) by mouth daily.  90 tablet  1  .  traZODone (DESYREL) 50 MG tablet Take 3 tablets (150 mg total) by mouth at bedtime. scheduled  90 tablet  0  . VIIBRYD 40 MG TABS Take 40 mg by mouth daily.       . vitamin E 100 UNIT capsule Take 100 Units by mouth daily.       No current facility-administered medications for this visit.    OBJECTIVE: Middle-aged white woman in no acute distress Filed Vitals:   04/04/13 1400  BP: 119/78  Pulse: 75  Temp: 98.4 F (36.9 C)  Resp: 20     Body mass index is 19.44 kg/(m^2).    ECOG FS: 0 Filed Weights   04/04/13 1400  Weight: 113 lb 4.8 oz (51.393 kg)    Sclerae unicteric Oropharynx clear No cervical or supraclavicular adenopathy Lungs clear to auscultation bilaterally, no rales or rhonchi Heart regular rate and rhythm, no murmur appreciated Abd soft, nontender, positive bowel sounds MSK no focal spinal tenderness, no peripheral edema Neuro: nonfocal, well oriented, friendly affect Breasts: The right breast is status post mastectomy and radiation.  There is no evidence of local recurrence. The right axilla is benign. The left breast is status post mastectomy. The left axilla is  benign.    LAB RESULTS: Lab Results  Component Value Date   WBC 7.0 03/24/2013   NEUTROABS 5.7 03/24/2013   HGB 13.9 03/24/2013   HCT 40.4 03/24/2013   MCV 90.8 03/24/2013   PLT 219 03/24/2013      Chemistry      Component Value Date/Time   NA 143 03/24/2013 1529   NA 140 10/19/2012 0510   K 4.0 03/24/2013 1529   K 3.9 10/19/2012 0510   CL 102 12/27/2012 0935   CL 107 10/19/2012 0510   CO2 31* 03/24/2013 1529   CO2 24 10/19/2012 0510   BUN 16.6 03/24/2013 1529   BUN 9 10/19/2012 0510   CREATININE 0.8 03/24/2013 1529   CREATININE 0.73 10/19/2012 0510      Component Value Date/Time   CALCIUM 8.9 03/24/2013 1529   CALCIUM 7.9* 10/19/2012 0510   ALKPHOS 80 03/24/2013 1529   ALKPHOS 87 10/18/2012 1952   AST 20 03/24/2013 1529   AST 37 10/18/2012 1952   ALT 16 03/24/2013 1529   ALT 25 10/18/2012 1952   BILITOT 0.67 03/24/2013 1529   BILITOT 1.0 10/18/2012 1952       Lab Results  Component Value Date   LABCA2 16 03/24/2013     STUDIES: No results found.   ASSESSMENT: 52 y.o. BRCA negative [place] woman status post right modified radical mastectomy 06/07/2012 for a multifocal invasive ductal carcinoma, the largest of 3 masses measuring 3.0 cm, grade 1; a second mass measuring 2.3 cm, grade 2; and a third mass measuring 2.2 cm, grade 1. The 2 larger masses were both 100% estrogen receptor positive, the grade 2 mass being also progesterone receptor positive at 98%, with an MIB-1 of 16% (the larger mass was estrogen receptor negative, with an Mib-1 of 12%). Both showed no HER-2 amplification. 9 of 19 axillary lymph nodes sampled were involved. In summary:  (1) right-sided mpT2 pN2a or stage IIIA invasive ductal carcinoma, grade 1-2, estrogen receptor positive, HER-2 not amplified, with an MIB-1 of 12-16%  (2) the patient opted to forego adjuvant chemotherapy  (3) adjuvant radiation therapy completed 09/03/2012   (4) on tamoxifen as of February 2014  (5) left simple mastectomy 09/30/2012,  with benign pathology; complicated by an infected seroma  PLAN:  Kelly [name] appears to be doing well with regards to her breast cancer. The plan is to continue on tamoxifen, and we will see her every 6 months for routine followup, alternating these appointments with Dr. Jamey Ripa. Accordingly, she'll see Dr. Jamey Ripa in December, and return to see [place] again next March. She is very interested in keeping track of her tumor marker, and requests that her labs be drawn every 3 months. Accordingly, we will try to coordinate these with each of her q. 3 month followup visits.  We are not planning to repeat radiologic studies in the absence of symptoms of until the 2 year Kelly [name] counting from her definitive surgery.   I have refilled Kelly [name]'s tamoxifen today. Per her request, I've also given her a courtesy refill of her trazodone which she takes at night to help her sleep. Future refill should be through her primary care physician. All this was reviewed in detail with Kelly [name] today, and she voices understanding and agreement with our plan. She will call with any changes or problems prior to her next appointment here in March.  I will mention that she is also scheduled to meet with Dr. Kelly Splinter later this month to discuss possible tram flap reconstruction.   Corlene Sabia    04/04/2013

## 2013-04-14 ENCOUNTER — Ambulatory Visit: Payer: BC Managed Care – PPO | Admitting: Radiation Oncology

## 2013-04-18 ENCOUNTER — Telehealth: Payer: Self-pay | Admitting: *Deleted

## 2013-04-18 NOTE — Telephone Encounter (Signed)
Returned patient's phone call, lvm for a return call 

## 2013-04-21 ENCOUNTER — Ambulatory Visit: Payer: BC Managed Care – PPO | Admitting: Radiation Oncology

## 2013-05-04 ENCOUNTER — Other Ambulatory Visit: Payer: Self-pay | Admitting: Physician Assistant

## 2013-05-19 ENCOUNTER — Encounter: Payer: Self-pay | Admitting: Oncology

## 2013-05-23 ENCOUNTER — Ambulatory Visit: Payer: BC Managed Care – PPO | Admitting: Radiation Oncology

## 2013-05-27 ENCOUNTER — Other Ambulatory Visit: Payer: Self-pay | Admitting: Plastic Surgery

## 2013-05-27 DIAGNOSIS — Z9013 Acquired absence of bilateral breasts and nipples: Secondary | ICD-10-CM

## 2013-05-27 NOTE — H&P (Signed)
This document contains confidential information from a [facility] medical record system and may be unauthenticated. Release may be made only with a valid authorization or in accordance with applicable policies of Medical Center or its affiliates. This document must be maintained in a secure manner or discarded/destroyed as required by Medical Center policy or by a confidential means such as shredding.   Kelly [name]  05/10/2013 9:15 AM   Office Visit  MRN:  [id number]  Department: Plastic Surgery  Dept Phone: [phone]  Description: Female DOB: 04/30/1961  Provider: Florrie Ramires Sanger, DO    Diagnoses    Breast cancer, unspecified laterality (HCC)    -  Primary 174.9      Reason for Visit -  Breast Reconstruction   Vitals - Last Recorded     88/53  83  98.2 F (36.8 C)  1.626 m (5' 4")  51.71 kg (114 lb)  19.56 kg/m2      SpO2 -  99%               Subjective:    Patient ID: Kelly [name] is a 52 y.o. female.  HPI The patient is a 52 yrs old female here for a history and physical for breast reconstruction. She noted a change in her right breast and underwent a mammogram on 04/2012. There was a spiculated mass posteriorly and laterally to the nipple line in the right breast, and a second mass more medially. There was an asymmetric soft tissue density in the upper outer quadrant of the left breast. An ultrasound of the right breast showed a lobulated mass measuring 2.3 cm and a second mass measuring 4.5 cm. There was also a third mass measuring 1.9 cm which was felt possibly to represent a lymph node and the right axilla had a suspicious lymph node measuring 1.1 cm. 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   Bilateral breast MRI (05/10/2012) confirmed the 3 right breast masses, measuring 2.1, 2.8 (at the 7:00 position) and 2.2 cm and a 1.3 cm right axillary lymph node. There were  no suspicious findings in the left breast. The fine-needle aspiration of the right axilla lymph node on 04/2012 was negative.   The patient underwent a right modified radial mastectomy on 06/07/2012. One of the masses was ER/PR positive and HER-2 negative. A total of 19 lymph nodes were sampled, of which 9 were positive. Margins were negative, but close (1 mm). She is 5 feet 3 inches tall, weighs 112 pounds and wore a 34B bra. She had 3 c-sections. She under went a left mastectomy in March. She was radiated on the right but not the left and it ended in February.  The following portions of the patient's history were reviewed and updated as appropriate: allergies, current medications, past family history, past medical history, past social history, past surgical history and problem list.  Review of Systems  Constitutional: Negative.   HENT: Negative.   Eyes: Negative.   Respiratory: Negative.   Cardiovascular: Negative.   Gastrointestinal: Negative.   Endocrine: Negative.   Genitourinary: Negative.   Neurological: Negative.   Hematological: Negative.   Psychiatric/Behavioral: Negative.       Objective:   Physical Exam  Constitutional: She appears well-developed and well-nourished.  HENT:   Head: Normocephalic and atraumatic.  Eyes: Conjunctivae and EOM are normal. Pupils are equal, round, and reactive to light.    Neck: Normal range of motion.  Cardiovascular: Normal rate.   Pulmonary/Chest: Effort normal.  Abdominal: Soft.  Musculoskeletal: Normal range of motion.  Neurological: She is alert.  Skin: Skin is warm.  Psychiatric: She has a normal mood and affect. Her behavior is normal. Judgment and thought content normal.   Assessment:   1.  Breast cancer, unspecified laterality (HCC)        Plan:   Plan for right latissimus/expander and left expander/FlexHD reconstruction.  The consent was obtained with risks and complications reviewed which included bleeding, pain, scar, infection  and the risk of anesthesia.  The patients questions were answered to the patients expressed satisfaction.   Medications Ordered This Encounter      HYDROcodone-acetaminophen (NORCO) 5-325 mg per tablet 30 Take 1 tablet by mouth every 6 (six) hours as needed for up to 10 days for Pain.     diazepam (VALIUM) 2 MG tablet 30 Take 1 tablet (2 mg total) by mouth every 12 (twelve) hours as needed for up to 10 days for Anxiety.   cephalexin (KEFLEX) 500 MG capsule 28 Take 1 capsule (500 mg total) by mouth 4 times daily.    ondansetron (ZOFRAN, AS HYDROCHLORIDE,) 4 MG tablet Take 1 tablet (4 mg total) by mouth every 8 (eight) hours as needed for up to 7 days for Nausea / Vomiting.  

## 2013-05-30 ENCOUNTER — Telehealth: Payer: Self-pay | Admitting: *Deleted

## 2013-05-30 ENCOUNTER — Encounter (HOSPITAL_COMMUNITY): Payer: Self-pay

## 2013-05-30 ENCOUNTER — Encounter (HOSPITAL_COMMUNITY)
Admission: RE | Admit: 2013-05-30 | Discharge: 2013-05-30 | Disposition: A | Discharge status: Home / Self Care | Payer: BC Managed Care – PPO | Source: Ambulatory Visit | Attending: Plastic Surgery | Admitting: Plastic Surgery

## 2013-05-30 VITALS — BP 94/59 | HR 96 | Temp 98.8°F | Resp 20 | Ht 64.0 in | Wt 113.2 lb

## 2013-05-30 DIAGNOSIS — Z9013 Acquired absence of bilateral breasts and nipples: Secondary | ICD-10-CM

## 2013-05-30 HISTORY — DX: Sleep apnea, unspecified: G47.30

## 2013-05-30 LAB — BASIC METABOLIC PANEL
BUN: 21 mg/dL (ref 6–23)
CO2: 33 mEq/L — ABNORMAL HIGH (ref 19–32)
Calcium: 9.6 mg/dL (ref 8.4–10.5)
Chloride: 98 mEq/L (ref 96–112)
Creatinine, Ser: 1.06 mg/dL (ref 0.50–1.10)
GFR calc Af Amer: 69 mL/min — ABNORMAL LOW (ref >90)
GFR calc non Af Amer: 60 mL/min — ABNORMAL LOW (ref >90)
Glucose, Bld: 100 mg/dL — ABNORMAL HIGH (ref 70–99)
Potassium: 4 mEq/L (ref 3.5–5.1)
Sodium: 137 mEq/L (ref 135–145)

## 2013-05-30 LAB — CBC
HCT: 39.9 % (ref 36.0–46.0)
Hemoglobin: 14.1 g/dL (ref 12.0–15.0)
MCH: 32.3 pg (ref 26.0–34.0)
MCHC: 35.3 g/dL (ref 30.0–36.0)
MCV: 91.3 fL (ref 78.0–100.0)
Platelets: 226 10*3/uL (ref 150–400)
RBC: 4.37 MIL/uL (ref 3.87–5.11)
RDW: 12.7 % (ref 11.5–15.5)
WBC: 7.1 10*3/uL (ref 4.0–10.5)

## 2013-05-30 LAB — HCG, SERUM, QUALITATIVE: Preg, Serum: NEGATIVE

## 2013-05-30 NOTE — Pre-Procedure Instructions (Signed)
Kelly [name]  05/30/2013   Your procedure is scheduled on:  Monday, November 10th  Report to Main Entrance "A" and check in with admitting at 1045 AM.  Call this number if you have problems the morning of surgery: [phone]   Remember:   Do not eat food or drink liquids after midnight.   Take these medicines the morning of surgery with A SIP OF WATER: wellbutrin, tamoxifen   Do not wear jewelry, make-up or nail polish.  Do not wear lotions, powders, or perfume, deodorant.  Do not shave 48 hours prior to surgery. Men may shave face and neck.  Do not bring valuables to the hospital.  [facility] is not responsible   for any belongings or valuables.               Contacts, dentures or bridgework may not be worn into surgery.  Leave suitcase in the car. After surgery it may be brought to your room.  For patients admitted to the hospital, discharge time is determined by your  treatment team.               Patients discharged the day of surgery will not be allowed to drive home.    Special Instructions: Shower using CHG 2 nights before surgery and the night before surgery.  If you shower the day of surgery use CHG.  Use special wash - you have one bottle of CHG for all showers.  You should use approximately 1/3 of the bottle for each shower.   Please read over the following fact sheets that you were given: Pain Booklet, Coughing and Deep Breathing and Surgical Site Infection Prevention

## 2013-05-30 NOTE — Telephone Encounter (Signed)
Pt called to this RN to inquire about " who do I call for occurrence of low back pain?"  Per phone discussion- Kelly [name] states pain has been present for several weeks. Is an " 8 on a scale of 1-10 " per pt which per inquiry pt is not using any medication for discomfort.  Pain is constant rather laying, sitting or standing.  Overall pain in in bilateral buttocks " and right on top of the buttocks in the middle of my back "  She notices pain radiates to higher area of discomfort with standing position.  Pain is described as " tight, sharp ".  Note pt was able to discuss pain in a calm manner without noted distress though she did state concern " due to my condition" .  Per discussion pt understands MD has left the office for the day and this note will be reviewed with him in am for appropriate recommendations.

## 2013-05-30 NOTE — Progress Notes (Signed)
Primary physician - dr. Lupita Leash gates Does not have a cardiologist ekg from march 2014 in epic no other cardiac testing

## 2013-05-31 ENCOUNTER — Other Ambulatory Visit: Payer: Self-pay | Admitting: *Deleted

## 2013-05-31 DIAGNOSIS — C50911 Malignant neoplasm of unspecified site of right female breast: Secondary | ICD-10-CM

## 2013-05-31 DIAGNOSIS — M545 Low back pain, unspecified: Secondary | ICD-10-CM | POA: Insufficient documentation

## 2013-05-31 LAB — SURGICAL PCR SCREEN
MRSA, PCR: NEGATIVE
Staphylococcus aureus: POSITIVE — AB

## 2013-05-31 NOTE — Progress Notes (Signed)
This RN spoke with pt post MD review and discussed MD recommendation for plain films of low back for evaluation of pain.  Pt verbalized understanding.  Order entered.

## 2013-06-02 ENCOUNTER — Ambulatory Visit (HOSPITAL_COMMUNITY)
Admission: RE | Admit: 2013-06-02 | Discharge: 2013-06-02 | Disposition: A | Discharge status: Home / Self Care | Payer: BC Managed Care – PPO | Source: Ambulatory Visit | Attending: Oncology | Admitting: Oncology

## 2013-06-02 ENCOUNTER — Other Ambulatory Visit: Payer: Self-pay

## 2013-06-02 ENCOUNTER — Ambulatory Visit: Payer: BC Managed Care – PPO | Attending: Radiation Oncology | Admitting: Radiation Oncology

## 2013-06-02 DIAGNOSIS — M51379 Other intervertebral disc degeneration, lumbosacral region without mention of lumbar back pain or lower extremity pain: Secondary | ICD-10-CM | POA: Insufficient documentation

## 2013-06-02 DIAGNOSIS — M5137 Other intervertebral disc degeneration, lumbosacral region: Secondary | ICD-10-CM | POA: Insufficient documentation

## 2013-06-02 DIAGNOSIS — M545 Low back pain, unspecified: Secondary | ICD-10-CM

## 2013-06-02 DIAGNOSIS — Z853 Personal history of malignant neoplasm of breast: Secondary | ICD-10-CM | POA: Insufficient documentation

## 2013-06-02 DIAGNOSIS — C50911 Malignant neoplasm of unspecified site of right female breast: Secondary | ICD-10-CM

## 2013-06-05 MED ORDER — CEFAZOLIN SODIUM-DEXTROSE 2-3 GM-% IV SOLR
2.0000 g | INTRAVENOUS | Status: AC
  Administered 2013-06-06 (×2): 2 g via INTRAVENOUS
  Filled 2013-06-05: qty 50

## 2013-06-06 ENCOUNTER — Inpatient Hospital Stay (HOSPITAL_COMMUNITY): Payer: BC Managed Care – PPO | Admitting: Certified Registered Nurse Anesthetist

## 2013-06-06 ENCOUNTER — Encounter (HOSPITAL_COMMUNITY): Payer: BC Managed Care – PPO | Admitting: Certified Registered Nurse Anesthetist

## 2013-06-06 ENCOUNTER — Inpatient Hospital Stay (HOSPITAL_COMMUNITY)
Admission: RE | Admit: 2013-06-06 | Discharge: 2013-06-08 | DRG: 585 | Disposition: A | Discharge status: Home / Self Care | Payer: BC Managed Care – PPO | Source: Ambulatory Visit | Attending: Plastic Surgery | Admitting: Plastic Surgery

## 2013-06-06 ENCOUNTER — Encounter (HOSPITAL_COMMUNITY)
Admission: RE | Disposition: A | Discharge status: Home / Self Care | Payer: Self-pay | Source: Ambulatory Visit | Attending: Plastic Surgery

## 2013-06-06 ENCOUNTER — Encounter (HOSPITAL_COMMUNITY): Payer: Self-pay | Admitting: Plastic Surgery

## 2013-06-06 DIAGNOSIS — Z9013 Acquired absence of bilateral breasts and nipples: Secondary | ICD-10-CM

## 2013-06-06 DIAGNOSIS — Z923 Personal history of irradiation: Secondary | ICD-10-CM

## 2013-06-06 DIAGNOSIS — Z853 Personal history of malignant neoplasm of breast: Secondary | ICD-10-CM

## 2013-06-06 DIAGNOSIS — Z79899 Other long term (current) drug therapy: Secondary | ICD-10-CM

## 2013-06-06 DIAGNOSIS — Z421 Encounter for breast reconstruction following mastectomy: Principal | ICD-10-CM

## 2013-06-06 DIAGNOSIS — Z9010 Acquired absence of unspecified breast and nipple: Secondary | ICD-10-CM

## 2013-06-06 DIAGNOSIS — Z23 Encounter for immunization: Secondary | ICD-10-CM

## 2013-06-06 DIAGNOSIS — G4730 Sleep apnea, unspecified: Secondary | ICD-10-CM | POA: Diagnosis present

## 2013-06-06 DIAGNOSIS — F341 Dysthymic disorder: Secondary | ICD-10-CM | POA: Diagnosis present

## 2013-06-06 HISTORY — PX: BREAST RECONSTRUCTION WITH PLACEMENT OF TISSUE EXPANDER AND FLEX HD (ACELLULAR HYDRATED DERMIS): SHX6295

## 2013-06-06 HISTORY — PX: LATISSIMUS FLAP TO BREAST: SHX5357

## 2013-06-06 LAB — CBC
HCT: 32.7 % — ABNORMAL LOW (ref 36.0–46.0)
Hemoglobin: 11.3 g/dL — ABNORMAL LOW (ref 12.0–15.0)
MCH: 32 pg (ref 26.0–34.0)
MCHC: 34.6 g/dL (ref 30.0–36.0)
MCV: 92.6 fL (ref 78.0–100.0)
Platelets: 130 10*3/uL — ABNORMAL LOW (ref 150–400)
RBC: 3.53 MIL/uL — ABNORMAL LOW (ref 3.87–5.11)
RDW: 13.2 % (ref 11.5–15.5)
WBC: 9.5 10*3/uL (ref 4.0–10.5)

## 2013-06-06 SURGERY — RECONSTRUCTION, BREAST, USING LATISSIMUS DORSI MYOCUTANEOUS FLAP
Anesthesia: General | Site: Breast | Laterality: Right | Wound class: Clean

## 2013-06-06 MED ORDER — DIAZEPAM 2 MG PO TABS
2.0000 mg | ORAL_TABLET | Freq: Two times a day (BID) | ORAL | Status: DC | PRN
  Administered 2013-06-06 – 2013-06-08 (×4): 2 mg via ORAL
  Filled 2013-06-06 (×3): qty 1

## 2013-06-06 MED ORDER — LACTATED RINGERS IV SOLN
INTRAVENOUS | Status: DC | PRN
  Administered 2013-06-06 (×3): via INTRAVENOUS

## 2013-06-06 MED ORDER — HYDROMORPHONE 0.3 MG/ML IV SOLN
INTRAVENOUS | Status: AC
  Administered 2013-06-06: 19:00:00
  Filled 2013-06-06: qty 25

## 2013-06-06 MED ORDER — MIDAZOLAM HCL 5 MG/5ML IJ SOLN
INTRAMUSCULAR | Status: DC | PRN
  Administered 2013-06-06: 2 mg via INTRAVENOUS

## 2013-06-06 MED ORDER — SODIUM CHLORIDE 0.9 % IR SOLN
Status: DC | PRN
  Administered 2013-06-06: 17:00:00

## 2013-06-06 MED ORDER — LACTATED RINGERS IV SOLN
INTRAVENOUS | Status: DC
  Administered 2013-06-06: 50 mL/h via INTRAVENOUS

## 2013-06-06 MED ORDER — EVICEL 5 ML EX KIT
PACK | CUTANEOUS | Status: DC | PRN
  Administered 2013-06-06: 10 mL

## 2013-06-06 MED ORDER — DIAZEPAM 5 MG PO TABS
ORAL_TABLET | ORAL | Status: AC
  Filled 2013-06-06: qty 1

## 2013-06-06 MED ORDER — FENTANYL CITRATE 0.05 MG/ML IJ SOLN
INTRAMUSCULAR | Status: DC | PRN
  Administered 2013-06-06: 150 ug via INTRAVENOUS
  Administered 2013-06-06: 25 ug via INTRAVENOUS
  Administered 2013-06-06: 50 ug via INTRAVENOUS
  Administered 2013-06-06: 25 ug via INTRAVENOUS

## 2013-06-06 MED ORDER — KCL IN DEXTROSE-NACL 20-5-0.45 MEQ/L-%-% IV SOLN
INTRAVENOUS | Status: DC
Start: 2013-06-06 — End: 2013-06-08
  Administered 2013-06-06 – 2013-06-08 (×4): via INTRAVENOUS
  Filled 2013-06-06 (×9): qty 1000

## 2013-06-06 MED ORDER — GLYCOPYRROLATE 0.2 MG/ML IJ SOLN
INTRAMUSCULAR | Status: DC | PRN
  Administered 2013-06-06: .5 mg via INTRAVENOUS

## 2013-06-06 MED ORDER — DIPHENHYDRAMINE HCL 12.5 MG/5ML PO ELIX: 12.5000 mg | ORAL_SOLUTION | Freq: Four times a day (QID) | ORAL | Status: DC | PRN

## 2013-06-06 MED ORDER — BUPIVACAINE HCL (PF) 0.25 % IJ SOLN
INTRAMUSCULAR | Status: AC
  Filled 2013-06-06: qty 60

## 2013-06-06 MED ORDER — HYDROMORPHONE 0.3 MG/ML IV SOLN
INTRAVENOUS | Status: DC
  Administered 2013-06-07: 1.3 mg via INTRAVENOUS
  Administered 2013-06-07: 2.7 mg via INTRAVENOUS

## 2013-06-06 MED ORDER — HYDROMORPHONE HCL PF 1 MG/ML IJ SOLN
INTRAMUSCULAR | Status: AC
  Filled 2013-06-06: qty 1

## 2013-06-06 MED ORDER — CEFAZOLIN SODIUM 1-5 GM-% IV SOLN
INTRAVENOUS | Status: AC
  Filled 2013-06-06: qty 100

## 2013-06-06 MED ORDER — ALBUMIN HUMAN 5 % IV SOLN
INTRAVENOUS | Status: DC | PRN
  Administered 2013-06-06 (×2): via INTRAVENOUS

## 2013-06-06 MED ORDER — EPHEDRINE SULFATE 50 MG/ML IJ SOLN
INTRAMUSCULAR | Status: DC | PRN
  Administered 2013-06-06: 15 mg via INTRAVENOUS
  Administered 2013-06-06: 10 mg via INTRAVENOUS
  Administered 2013-06-06: 15 mg via INTRAVENOUS
  Administered 2013-06-06: 10 mg via INTRAVENOUS

## 2013-06-06 MED ORDER — NALOXONE HCL 0.4 MG/ML IJ SOLN: 0.4000 mg | INTRAMUSCULAR | Status: DC | PRN

## 2013-06-06 MED ORDER — LIDOCAINE HCL (CARDIAC) 20 MG/ML IV SOLN
INTRAVENOUS | Status: DC | PRN
  Administered 2013-06-06: 100 mg via INTRAVENOUS

## 2013-06-06 MED ORDER — PHENYLEPHRINE HCL 10 MG/ML IJ SOLN
INTRAMUSCULAR | Status: DC | PRN
  Administered 2013-06-06 (×3): 120 ug via INTRAVENOUS
  Administered 2013-06-06: 40 ug via INTRAVENOUS

## 2013-06-06 MED ORDER — PROPOFOL 10 MG/ML IV BOLUS
INTRAVENOUS | Status: DC | PRN
  Administered 2013-06-06: 100 mg via INTRAVENOUS

## 2013-06-06 MED ORDER — SODIUM CHLORIDE 0.9 % IV SOLN
10.0000 mg | INTRAVENOUS | Status: DC | PRN
  Administered 2013-06-06: 40 ug/min via INTRAVENOUS

## 2013-06-06 MED ORDER — NEOSTIGMINE METHYLSULFATE 1 MG/ML IJ SOLN
INTRAMUSCULAR | Status: DC | PRN
  Administered 2013-06-06: 3 mg via INTRAVENOUS

## 2013-06-06 MED ORDER — EVICEL 5 ML EX KIT
PACK | CUTANEOUS | Status: AC
  Filled 2013-06-06: qty 2

## 2013-06-06 MED ORDER — OXYCODONE HCL ER 10 MG PO T12A
10.0000 mg | EXTENDED_RELEASE_TABLET | Freq: Two times a day (BID) | ORAL | Status: DC
  Administered 2013-06-06: 10 mg via ORAL
  Filled 2013-06-06: qty 1

## 2013-06-06 MED ORDER — SODIUM CHLORIDE 0.9 % IJ SOLN: 9.0000 mL | INTRAMUSCULAR | Status: DC | PRN

## 2013-06-06 MED ORDER — ONDANSETRON HCL 4 MG/2ML IJ SOLN
INTRAMUSCULAR | Status: DC | PRN
  Administered 2013-06-06: 4 mg via INTRAVENOUS

## 2013-06-06 MED ORDER — BUPROPION HCL ER (XL) 150 MG PO TB24
150.0000 mg | ORAL_TABLET | Freq: Every day | ORAL | Status: DC
  Administered 2013-06-07 – 2013-06-08 (×2): 150 mg via ORAL
  Filled 2013-06-06 (×2): qty 1

## 2013-06-06 MED ORDER — ONDANSETRON HCL 4 MG/2ML IJ SOLN: 4.0000 mg | Freq: Four times a day (QID) | INTRAMUSCULAR | Status: DC | PRN

## 2013-06-06 MED ORDER — HYDROMORPHONE HCL PF 1 MG/ML IJ SOLN
0.2500 mg | INTRAMUSCULAR | Status: DC | PRN
  Administered 2013-06-06: 0.25 mg via INTRAVENOUS
  Administered 2013-06-06: 0.5 mg via INTRAVENOUS
  Administered 2013-06-06: 0.25 mg via INTRAVENOUS

## 2013-06-06 MED ORDER — BUPIVACAINE HCL (PF) 0.25 % IJ SOLN
INTRAMUSCULAR | Status: DC | PRN
  Administered 2013-06-06: 10 mL

## 2013-06-06 MED ORDER — ROCURONIUM BROMIDE 100 MG/10ML IV SOLN
INTRAVENOUS | Status: DC | PRN
  Administered 2013-06-06: 10 mg via INTRAVENOUS
  Administered 2013-06-06: 50 mg via INTRAVENOUS

## 2013-06-06 MED ORDER — AMPICILLIN-SULBACTAM SODIUM 3 (2-1) G IJ SOLR
3.0000 g | Freq: Four times a day (QID) | INTRAMUSCULAR | Status: DC
  Administered 2013-06-06 – 2013-06-07 (×2): 3 g via INTRAVENOUS
  Filled 2013-06-06 (×3): qty 3

## 2013-06-06 MED ORDER — DIPHENHYDRAMINE HCL 50 MG/ML IJ SOLN: 12.5000 mg | Freq: Four times a day (QID) | INTRAMUSCULAR | Status: DC | PRN

## 2013-06-06 MED ORDER — DOCUSATE SODIUM 100 MG PO CAPS
100.0000 mg | ORAL_CAPSULE | Freq: Three times a day (TID) | ORAL | Status: DC
  Administered 2013-06-06 – 2013-06-08 (×6): 100 mg via ORAL
  Filled 2013-06-06 (×6): qty 1

## 2013-06-06 SURGICAL SUPPLY — 84 items
APPLIER CLIP 9.375 MED OPEN (MISCELLANEOUS) ×3
BAG DECANTER FOR FLEXI CONT (MISCELLANEOUS) ×3 IMPLANT
BINDER BREAST LRG (GAUZE/BANDAGES/DRESSINGS) IMPLANT
BINDER BREAST MEDIUM (GAUZE/BANDAGES/DRESSINGS) ×3 IMPLANT
BINDER BREAST XLRG (GAUZE/BANDAGES/DRESSINGS) IMPLANT
BIOPATCH RED 1 DISK 7.0 (GAUZE/BANDAGES/DRESSINGS) ×12 IMPLANT
BLADE SURG 10 STRL SS (BLADE) ×3 IMPLANT
BLADE SURG 15 STRL LF DISP TIS (BLADE) ×2 IMPLANT
BLADE SURG 15 STRL SS (BLADE) ×1
BNDG COHESIVE 4X5 TAN STRL (GAUZE/BANDAGES/DRESSINGS) IMPLANT
Breast Tissue Expander with Suture Tabs  275CC ×3 IMPLANT
Breast Tissue Expander with suture tabs  275 CC ×3 IMPLANT
CANISTER SUCTION 2500CC (MISCELLANEOUS) ×3 IMPLANT
CHLORAPREP W/TINT 26ML (MISCELLANEOUS) ×3 IMPLANT
CLIP APPLIE 9.375 MED OPEN (MISCELLANEOUS) ×2 IMPLANT
CLOTH BEACON ORANGE TIMEOUT ST (SAFETY) ×3 IMPLANT
COVER SURGICAL LIGHT HANDLE (MISCELLANEOUS) ×3 IMPLANT
DERMABOND ADVANCED (GAUZE/BANDAGES/DRESSINGS) ×2
DERMABOND ADVANCED .7 DNX12 (GAUZE/BANDAGES/DRESSINGS) ×4 IMPLANT
DRAIN CHANNEL 19F RND (DRAIN) ×12 IMPLANT
DRAPE INCISE 23X17 IOBAN STRL (DRAPES)
DRAPE INCISE IOBAN 23X17 STRL (DRAPES) IMPLANT
DRAPE INCISE IOBAN 85X60 (DRAPES) IMPLANT
DRAPE ORTHO SPLIT 77X108 STRL (DRAPES) ×4
DRAPE PROXIMA HALF (DRAPES) ×9 IMPLANT
DRAPE SURG 17X23 STRL (DRAPES) ×24 IMPLANT
DRAPE SURG ORHT 6 SPLT 77X108 (DRAPES) ×8 IMPLANT
DRAPE WARM FLUID 44X44 (DRAPE) ×3 IMPLANT
DRSG MEPILEX BORDER 4X8 (GAUZE/BANDAGES/DRESSINGS) ×3 IMPLANT
DRSG PAD ABDOMINAL 8X10 ST (GAUZE/BANDAGES/DRESSINGS) ×3 IMPLANT
ELECT BLADE 4.0 EZ CLEAN MEGAD (MISCELLANEOUS) ×3
ELECT BLADE 6.5 EXT (BLADE) ×3 IMPLANT
ELECT CAUTERY BLADE 6.4 (BLADE) ×9 IMPLANT
ELECT REM PT RETURN 9FT ADLT (ELECTROSURGICAL) ×3
ELECTRODE BLDE 4.0 EZ CLN MEGD (MISCELLANEOUS) ×2 IMPLANT
ELECTRODE REM PT RTRN 9FT ADLT (ELECTROSURGICAL) ×2 IMPLANT
EVACUATOR SILICONE 100CC (DRAIN) ×18 IMPLANT
GAUZE XEROFORM 5X9 LF (GAUZE/BANDAGES/DRESSINGS) ×3 IMPLANT
GLOVE BIO SURGEON STRL SZ 6.5 (GLOVE) ×6 IMPLANT
GLOVE BIOGEL PI IND STRL 6.5 (GLOVE) ×4 IMPLANT
GLOVE BIOGEL PI IND STRL 7.0 (GLOVE) ×4 IMPLANT
GLOVE BIOGEL PI INDICATOR 6.5 (GLOVE) ×2
GLOVE BIOGEL PI INDICATOR 7.0 (GLOVE) ×2
GLOVE ECLIPSE 6.5 STRL STRAW (GLOVE) ×6 IMPLANT
GLOVE SURG SS PI 7.0 STRL IVOR (GLOVE) ×3 IMPLANT
GOWN STRL NON-REIN LRG LVL3 (GOWN DISPOSABLE) ×9 IMPLANT
GOWN STRL REIN XL XLG (GOWN DISPOSABLE) ×3 IMPLANT
GRAFT FLEX HD 4X16 THICK (Tissue Mesh) ×3 IMPLANT
IV NS 1000ML (IV SOLUTION) ×1
IV NS 1000ML BAXH (IV SOLUTION) ×2 IMPLANT
KIT BASIN OR (CUSTOM PROCEDURE TRAY) ×3 IMPLANT
KIT ROOM TURNOVER OR (KITS) ×3 IMPLANT
NEEDLE HYPO 25GX1X1/2 BEV (NEEDLE) ×3 IMPLANT
NS IRRIG 1000ML POUR BTL (IV SOLUTION) ×6 IMPLANT
PACK GENERAL/GYN (CUSTOM PROCEDURE TRAY) ×3 IMPLANT
PAD ARMBOARD 7.5X6 YLW CONV (MISCELLANEOUS) ×9 IMPLANT
PENCIL BUTTON HOLSTER BLD 10FT (ELECTRODE) ×6 IMPLANT
PIN SAFETY STERILE (MISCELLANEOUS) ×3 IMPLANT
SET ASEPTIC TRANSFER (MISCELLANEOUS) ×3 IMPLANT
SET COLLECT BLD 21X3/4 12 PB (MISCELLANEOUS) IMPLANT
SPONGE GAUZE 4X4 12PLY (GAUZE/BANDAGES/DRESSINGS) ×6 IMPLANT
SPONGE LAP 18X18 X RAY DECT (DISPOSABLE) ×3 IMPLANT
STAPLER VISISTAT 35W (STAPLE) ×3 IMPLANT
STOCKINETTE IMPERVIOUS 9X36 MD (GAUZE/BANDAGES/DRESSINGS) IMPLANT
STOPCOCK 4 WAY LG BORE MALE ST (IV SETS) IMPLANT
STRIP CLOSURE SKIN 1/2X4 (GAUZE/BANDAGES/DRESSINGS) ×6 IMPLANT
SUT ETHILON 2 0 FS 18 (SUTURE) ×6 IMPLANT
SUT MNCRL AB 3-0 PS2 18 (SUTURE) ×12 IMPLANT
SUT MNCRL AB 4-0 PS2 18 (SUTURE) ×15 IMPLANT
SUT MON AB 5-0 PS2 18 (SUTURE) ×12 IMPLANT
SUT PDS AB 2-0 CT1 27 (SUTURE) ×12 IMPLANT
SUT SILK 3 0 SH 30 (SUTURE) ×9 IMPLANT
SUT VIC AB 3-0 PS2 18 (SUTURE) ×4
SUT VIC AB 3-0 PS2 18XBRD (SUTURE) ×8 IMPLANT
SUT VIC AB 3-0 SH 27 (SUTURE) ×5
SUT VIC AB 3-0 SH 27X BRD (SUTURE) ×10 IMPLANT
SUT VIC AB 3-0 SH 8-18 (SUTURE) ×3 IMPLANT
SUT VICRYL 4-0 PS2 18IN ABS (SUTURE) ×6 IMPLANT
SYR 50ML SLIP (SYRINGE) IMPLANT
SYR BULB IRRIGATION 50ML (SYRINGE) ×3 IMPLANT
SYR CONTROL 10ML LL (SYRINGE) ×3 IMPLANT
TOWEL OR 17X24 6PK STRL BLUE (TOWEL DISPOSABLE) ×3 IMPLANT
TOWEL OR 17X26 10 PK STRL BLUE (TOWEL DISPOSABLE) ×3 IMPLANT
TRAY FOLEY CATH 14FRSI W/METER (CATHETERS) ×3 IMPLANT

## 2013-06-06 NOTE — Op Note (Signed)
DATE OF OPERATION: 06/06/2013  PREOPERATIVE DIAGNOSIS: Breast Cancer s/p bilateral mastectomies   POSTOPERATIVE DIAGNOSIS: Same  PROCEDURE: 1. Latissimus flap right to reconstruct the breast CPT 19361 2. Tissue expander placement CPT 19357  SURGEON: Alan Ripper Sanger  ASSISTANT: Shawn Rayburn, PA  EBL: 250 cc  SPECIMEN: None  DRAINS: 4 total 19 blake round drains  CONDITION: Stable  COMPLICATIONS: None  INDICATION: The patient, Kelly [name], is a 52 y.o. female born on [birth year], is here for treatment after bilateral mastectomy.    PROCEDURE DETAILS:  Patient was seen on the morning of her surgery and marked out for her flap. She was then taken to the operating room and given a general anesthetic. She was given an IV and IV antibiotics. An adequate time out was done. She has SCD's and a foley catheter placed. She was placed into the left lateral decubitus position with all key points padded. She was then prepped and draped in the standard sterile fashion using a chloroprep. The paddle design and position was confirmed. The procedure began by incising the margins of the paddle and dissecting out until all four margins of the muscle were identified. The muscle was then released inferiorly and anteriorly and care was taken not to pick up the serratus anteriorly or the paraspinous muscles posteriorly. The flap was then raised to the scapula and released and rotated medially. Care was taken to protect the vascular pedicle throughout this portion of the procedure. The paddle and muscle looked healthy throughout. I then opened the old mastectomy scar and raised the flaps on top of the pectoralis muscle as there was a reasonable amount of skin. The posterior and anterior pockets were then connected in the plane above the muscle. The muscle from the back and the skin paddle were then rotated into the chest pocket and tacked in place with staples and covered with an ioban dressing. The flap pedicle was  inspected and there was no tension. The back pocket was hemostased and Evicel placed. Two #19 blake round drains were place and secured with 3-0 Silk. The back incision was closed with buried 3-0 Vicryl, followed by 4-0 Monocryl and 5-0 Monocryl.  Dermabond and a protective dressing was applied.   The patient was then repositioned onto her back and the chest was prepped and draped with a betadine. The breast pocket was inspected and hemostases was achieved with electrocautery. The muscle was then secured superiorly to the pectoralis muscle with 3-0 Monocryl. A 275 cc expander was chosen. It was soaked in triple antibiotic solution and evacuated of air and then filled with 20 cc of sterile saline. The inferior portion of the muscle was tacked to the inframammary fold with 3-0 Monocryl.  One drain was placed on this side and secured with 3-0 Silk. The flap was then closed with 3-0 Monocryl deep, followed by 4-0 Monocryl and the skin closed with 5-0 Monocryl.    Attention was then turned to the left breast.  The previous skin incision was opened with a #10 blade.  The electrocautery was used to raise the flaps over the pectoralis muscle and then raise the pectoralis muscle to create a pocket for the expander.  The 275 cc mentor expander was selected and placed in with 3-0 PDS to secure it with the tabs.  The expander was filled with 30 cc of injectable saline.  The Flex HD was prepared according to the manufacture guidelines and slits were placed to help facilitate drainage.  The ADM was secured with  2-0 PDS to the edge of the pectoralis muscle and to the inframammary fold.  A drain was placed and secured with 3-0 silk.  The skin was closed with 4-0 Monocryl followed by 5-0 Monocryl and dermabond. ABDs and a breast binder was applied.   The foley was removed at the end of the case. The patient was allowed to wake up and taken to recovery room in stable condition at the end of the case. The family was notified at  the end of the case as well.

## 2013-06-06 NOTE — Anesthesia Preprocedure Evaluation (Addendum)
Anesthesia Evaluation  Patient identified by MRN, date of birth, ID band Patient awake    Reviewed: Allergy & Precautions, H&P , NPO status , Patient's Chart, lab work & pertinent test results  Airway Mallampati: II      Dental  (+) Teeth Intact and Dental Advisory Given   Pulmonary sleep apnea ,          Cardiovascular negative cardio ROS  Rhythm:Regular Rate:Normal     Neuro/Psych Anxiety Depression    GI/Hepatic negative GI ROS,   Endo/Other  negative endocrine ROS  Renal/GU negative Renal ROS     Musculoskeletal   Abdominal   Peds  Hematology   Anesthesia Other Findings   Reproductive/Obstetrics                          Anesthesia Physical Anesthesia Plan  ASA: II  Anesthesia Plan: General   Post-op Pain Management:    Induction: Intravenous  Airway Management Planned: Oral ETT  Additional Equipment:   Intra-op Plan:   Post-operative Plan: Extubation in OR  Informed Consent: I have reviewed the patients History and Physical, chart, labs and discussed the procedure including the risks, benefits and alternatives for the proposed anesthesia with the patient or authorized representative who has indicated his/her understanding and acceptance.   Dental advisory given  Plan Discussed with: CRNA and Anesthesiologist  Anesthesia Plan Comments:         Anesthesia Quick Evaluation

## 2013-06-06 NOTE — Anesthesia Postprocedure Evaluation (Signed)
Anesthesia Post Note  Patient: Kelly [name]  Procedure(s) Performed: Procedure(s) (LRB): RIGHT LATISSIMUS MYOCUTANEOUS FLAP WITH PLACEMENT OF TISSUE EXPANDER IN RIGHT BREAST  (Right) PLACEMENT OF LEFT BREAST TISSUE EXPANDER AND FLEX HD  (Left)  Anesthesia type: general  Patient location: PACU  Post pain: Pain level controlled  Post assessment: Patient's Cardiovascular Status Stable  Last Vitals:  Filed Vitals:   06/06/13 1845  BP: 80/54  Pulse: 87  Temp: 36.7 C  Resp: 13    Post vital signs: Reviewed and stable  Level of consciousness: sedated  Complications: No apparent anesthesia complications

## 2013-06-06 NOTE — OR Nursing (Signed)
Pt tx to 6N 30. Pt verbalized that she felt that I had been rough in my communication w/her and impatient in my treatment. I verbalized that I was sorry that this was her experience and that it was not my intent or feeling toward taking care of her.  Either Kelly [name] and [employer] were with me at all times while I was caring for her and witnessed all exchanges between I and the patient.

## 2013-06-06 NOTE — Transfer of Care (Signed)
Immediate Anesthesia Transfer of Care Note  Patient: Kelly [name]  Procedure(s) Performed: Procedure(s): RIGHT LATISSIMUS MYOCUTANEOUS FLAP WITH PLACEMENT OF TISSUE EXPANDER IN RIGHT BREAST  (Right) PLACEMENT OF LEFT BREAST TISSUE EXPANDER AND FLEX HD  (Left)  Patient Location: PACU  Anesthesia Type:General  Level of Consciousness: awake and patient cooperative  Airway & Oxygen Therapy: Patient Spontanous Breathing and Patient connected to nasal cannula oxygen  Post-op Assessment: Report given to PACU RN, Post -op Vital signs reviewed and stable and Patient moving all extremities X 4  Post vital signs: Reviewed and stable  Complications: No apparent anesthesia complications

## 2013-06-06 NOTE — Interval H&P Note (Signed)
History and Physical Interval Note:  06/06/2013 8:33 AM  Kelly [name]  has presented today for surgery, with the diagnosis of HX OF BREAST CANCER   The various methods of treatment have been discussed with the patient and family. After consideration of risks, benefits and other options for treatment, the patient has consented to  Procedure(s): RIGHT LATISSIMUS MYOCUTANEOUS FLAP WITH PLACEMENT OF TISSUE EXPANDER IN RIGHT BREAST  (Right) PLACEMENT OF LEFT BREAST TISSUE EXPANDER AND FLEX HD  (Left) as a surgical intervention .  The patient's history has been reviewed, patient examined, no change in status, stable for surgery.  I have reviewed the patient's chart and labs.  Questions were answered to the patient's satisfaction.     SANGER,Vylet Maffia

## 2013-06-06 NOTE — Anesthesia Procedure Notes (Signed)
Procedure Name: Intubation Date/Time: 06/06/2013 1:07 PM Performed by: Rogelia Boga Pre-anesthesia Checklist: Patient identified, Emergency Drugs available, Suction available, Patient being monitored and Timeout performed Patient Re-evaluated:Patient Re-evaluated prior to inductionOxygen Delivery Method: Circle system utilized Preoxygenation: Pre-oxygenation with 100% oxygen Intubation Type: IV induction Ventilation: Mask ventilation without difficulty and Oral airway inserted - appropriate to patient size Laryngoscope Size: Mac and 4 Grade View: Grade I Tube type: Oral Tube size: 7.0 mm Number of attempts: 1 Airway Equipment and Method: Stylet Placement Confirmation: ETT inserted through vocal cords under direct vision,  positive ETCO2 and breath sounds checked- equal and bilateral Secured at: 18 cm Tube secured with: Tape Dental Injury: Teeth and Oropharynx as per pre-operative assessment

## 2013-06-06 NOTE — Preoperative (Signed)
Beta Blockers   Reason not to administer Beta Blockers:Not Applicable 

## 2013-06-06 NOTE — Brief Op Note (Signed)
06/06/2013  6:14 PM  PATIENT:  Kelly [name]  52 y.o. female  PRE-OPERATIVE DIAGNOSIS:  HX OF BREAST CANCER   POST-OPERATIVE DIAGNOSIS:  HX OF BREAST CANCER   PROCEDURE:  Procedure(s): RIGHT LATISSIMUS MYOCUTANEOUS FLAP WITH PLACEMENT OF TISSUE EXPANDER IN RIGHT BREAST  (Right) PLACEMENT OF LEFT BREAST TISSUE EXPANDER AND FLEX HD  (Left)  SURGEON:  Surgeon(s) and Role:    * Marshawn Ninneman Sanger, DO - Primary  PHYSICIAN ASSISTANT: Shawn Rayburn, PA  ASSISTANTS: none   ANESTHESIA:   general  EBL:  Total I/O In: 2750 [I.V.:2000; IV Piggyback:750] Out: 305 [Urine:155; Blood:150]  BLOOD ADMINISTERED:none  DRAINS: (4) Jackson-Pratt drain(s) with closed bulb suction in the one in each breast pocket and two in the back.   LOCAL MEDICATIONS USED:  LIDOCAINE   SPECIMEN:  No Specimen  DISPOSITION OF SPECIMEN:  N/A  COUNTS:  YES  TOURNIQUET:  * No tourniquets in log *  DICTATION: .Dragon Dictation  PLAN OF CARE: Admit to inpatient   PATIENT DISPOSITION:  PACU - hemodynamically stable.   Delay start of Pharmacological VTE agent (>24hrs) due to surgical blood loss or risk of bleeding: no

## 2013-06-06 NOTE — H&P (View-Only) (Signed)
This document contains confidential information from a [facility] medical record system and may be unauthenticated. Release may be made only with a valid authorization or in accordance with applicable policies of Medical Center or its affiliates. This document must be maintained in a secure manner or discarded/destroyed as required by Medical Center policy or by a confidential means such as shredding.   Kelly [name]  05/10/2013 9:15 AM   Office Visit  MRN:  [id number]  Department: Plastic Surgery  Dept Phone: [phone]  Description: Female DOB: 04/02/1961  Provider: Wayland Denis, DO    Diagnoses    Breast cancer, unspecified laterality (HCC)    -  Primary 174.9      Reason for Visit -  Breast Reconstruction   Vitals - Last Recorded     88/53  83  98.2 F (36.8 C)  1.626 m (5\' 4" )  51.71 kg (114 lb)  19.56 kg/m2      SpO2 -  99%               Subjective:    Patient ID: Kelly [name] is a 52 y.o. female.  HPI The patient is a 52 yrs old female here for a history and physical for breast reconstruction. She noted a change in her right breast and underwent a mammogram on 04/2012. There was a spiculated mass posteriorly and laterally to the nipple line in the right breast, and a second mass more medially. There was an asymmetric soft tissue density in the upper outer quadrant of the left breast. An ultrasound of the right breast showed a lobulated mass measuring 2.3 cm and a second mass measuring 4.5 cm. There was also a third mass measuring 1.9 cm which was felt possibly to represent a lymph node and the right axilla had a suspicious lymph node measuring 1.1 cm. 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   Bilateral breast MRI (05/10/2012) confirmed the 3 right breast masses, measuring 2.1, 2.8 (at the 7:00 position) and 2.2 cm and a 1.3 cm right axillary lymph node. There were  no suspicious findings in the left breast. The fine-needle aspiration of the right axilla lymph node on 04/2012 was negative.   The patient underwent a right modified radial mastectomy on 06/07/2012. One of the masses was ER/PR positive and HER-2 negative. A total of 19 lymph nodes were sampled, of which 9 were positive. Margins were negative, but close (1 mm). She is 5 feet 3 inches tall, weighs 112 pounds and wore a 34B bra. She had 3 c-sections. She under went a left mastectomy in March. She was radiated on the right but not the left and it ended in February.  The following portions of the patient's history were reviewed and updated as appropriate: allergies, current medications, past family history, past medical history, past social history, past surgical history and problem list.  Review of Systems  Constitutional: Negative.   HENT: Negative.   Eyes: Negative.   Respiratory: Negative.   Cardiovascular: Negative.   Gastrointestinal: Negative.   Endocrine: Negative.   Genitourinary: Negative.   Neurological: Negative.   Hematological: Negative.   Psychiatric/Behavioral: Negative.       Objective:   Physical Exam  Constitutional: She appears well-developed and well-nourished.  HENT:   Head: Normocephalic and atraumatic.  Eyes: Conjunctivae and EOM are normal. Pupils are equal, round, and reactive to light.  Neck: Normal range of motion.  Cardiovascular: Normal rate.   Pulmonary/Chest: Effort normal.  Abdominal: Soft.  Musculoskeletal: Normal range of motion.  Neurological: She is alert.  Skin: Skin is warm.  Psychiatric: She has a normal mood and affect. Her behavior is normal. Judgment and thought content normal.   Assessment:   1.  Breast cancer, unspecified laterality (HCC)        Plan:   Plan for right latissimus/expander and left expander/FlexHD reconstruction.  The consent was obtained with risks and complications reviewed which included bleeding, pain, scar, infection  and the risk of anesthesia.  The patients questions were answered to the patients expressed satisfaction.   Medications Ordered This Encounter      HYDROcodone-acetaminophen (NORCO) 5-325 mg per tablet 30 Take 1 tablet by mouth every 6 (six) hours as needed for up to 10 days for Pain.     diazepam (VALIUM) 2 MG tablet 30 Take 1 tablet (2 mg total) by mouth every 12 (twelve) hours as needed for up to 10 days for Anxiety.   cephalexin (KEFLEX) 500 MG capsule 28 Take 1 capsule (500 mg total) by mouth 4 times daily.    ondansetron (ZOFRAN, AS HYDROCHLORIDE,) 4 MG tablet Take 1 tablet (4 mg total) by mouth every 8 (eight) hours as needed for up to 7 days for Nausea / Vomiting.

## 2013-06-07 LAB — BASIC METABOLIC PANEL
BUN: 11 mg/dL (ref 6–23)
CO2: 21 mEq/L (ref 19–32)
Calcium: 8.3 mg/dL — ABNORMAL LOW (ref 8.4–10.5)
Chloride: 101 mEq/L (ref 96–112)
Creatinine, Ser: 0.68 mg/dL (ref 0.50–1.10)
GFR calc Af Amer: >90 mL/min (ref >90)
GFR calc non Af Amer: >90 mL/min (ref >90)
Glucose, Bld: 129 mg/dL — ABNORMAL HIGH (ref 70–99)
Potassium: 4.4 mEq/L (ref 3.5–5.1)
Sodium: 133 mEq/L — ABNORMAL LOW (ref 135–145)

## 2013-06-07 MED ORDER — ONDANSETRON HCL 4 MG/2ML IJ SOLN: 4.0000 mg | Freq: Four times a day (QID) | INTRAMUSCULAR | Status: DC | PRN

## 2013-06-07 MED ORDER — VITAMIN D3 25 MCG (1000 UNIT) PO TABS
5000.0000 [IU] | ORAL_TABLET | Freq: Every day | ORAL | Status: DC
  Administered 2013-06-07 – 2013-06-08 (×2): 5000 [IU] via ORAL
  Filled 2013-06-07 (×2): qty 5

## 2013-06-07 MED ORDER — MUPIROCIN 2 % EX OINT
1.0000 "application " | TOPICAL_OINTMENT | Freq: Two times a day (BID) | CUTANEOUS | Status: DC
  Administered 2013-06-07 – 2013-06-08 (×3): 1 via NASAL

## 2013-06-07 MED ORDER — CHLORHEXIDINE GLUCONATE 0.12 % MT SOLN
15.0000 mL | Freq: Two times a day (BID) | OROMUCOSAL | Status: DC
  Administered 2013-06-07 (×2): 15 mL via OROMUCOSAL
  Filled 2013-06-07 (×2): qty 15

## 2013-06-07 MED ORDER — DIPHENHYDRAMINE HCL 50 MG/ML IJ SOLN: 12.5000 mg | Freq: Four times a day (QID) | INTRAMUSCULAR | Status: DC | PRN

## 2013-06-07 MED ORDER — HYDROMORPHONE 0.3 MG/ML IV SOLN: INTRAVENOUS | Status: DC

## 2013-06-07 MED ORDER — INFLUENZA VAC SPLIT QUAD 0.5 ML IM SUSP
0.5000 mL | INTRAMUSCULAR | Status: DC
  Filled 2013-06-07: qty 0.5

## 2013-06-07 MED ORDER — HYDROCODONE-ACETAMINOPHEN 7.5-325 MG/15ML PO SOLN
10.0000 mL | Freq: Four times a day (QID) | ORAL | Status: DC | PRN
  Administered 2013-06-07 – 2013-06-08 (×5): 10 mL via ORAL
  Filled 2013-06-07 (×6): qty 15

## 2013-06-07 MED ORDER — DIPHENHYDRAMINE HCL 12.5 MG/5ML PO ELIX: 12.5000 mg | ORAL_SOLUTION | Freq: Four times a day (QID) | ORAL | Status: DC | PRN

## 2013-06-07 MED ORDER — VITAMIN D-3 125 MCG (5000 UT) PO TABS: 5000.0000 [IU] | ORAL_TABLET | Freq: Every day | ORAL | Status: DC

## 2013-06-07 MED ORDER — NALOXONE HCL 0.4 MG/ML IJ SOLN: 0.4000 mg | INTRAMUSCULAR | Status: DC | PRN

## 2013-06-07 MED ORDER — TRAZODONE HCL 150 MG PO TABS
150.0000 mg | ORAL_TABLET | Freq: Every day | ORAL | Status: DC
  Administered 2013-06-07: 150 mg via ORAL
  Filled 2013-06-07 (×2): qty 1

## 2013-06-07 MED ORDER — VILAZODONE HCL 40 MG PO TABS
40.0000 mg | ORAL_TABLET | Freq: Every day | ORAL | Status: DC
  Administered 2013-06-07 – 2013-06-08 (×2): 40 mg via ORAL
  Filled 2013-06-07 (×2): qty 1

## 2013-06-07 MED ORDER — ENSURE COMPLETE PO LIQD: 237.0000 mL | ORAL | Status: DC | PRN

## 2013-06-07 MED ORDER — TAMOXIFEN CITRATE 20 MG PO TABS
20.0000 mg | ORAL_TABLET | Freq: Every day | ORAL | Status: DC
  Administered 2013-06-07 – 2013-06-08 (×2): 20 mg via ORAL
  Filled 2013-06-07 (×2): qty 1

## 2013-06-07 MED ORDER — BIOTIN 10 MG PO TABS: 1.0000 | ORAL_TABLET | Freq: Every day | ORAL | Status: DC

## 2013-06-07 MED ORDER — SODIUM CHLORIDE 0.9 % IJ SOLN: 9.0000 mL | INTRAMUSCULAR | Status: DC | PRN

## 2013-06-07 MED ORDER — OXYCODONE HCL ER 10 MG PO T12A
10.0000 mg | EXTENDED_RELEASE_TABLET | Freq: Two times a day (BID) | ORAL | Status: DC
  Administered 2013-06-07 – 2013-06-08 (×3): 10 mg via ORAL
  Filled 2013-06-07 (×3): qty 1

## 2013-06-07 MED ORDER — BIOTENE DRY MOUTH MT LIQD: 15.0000 mL | Freq: Two times a day (BID) | OROMUCOSAL | Status: DC

## 2013-06-07 MED ORDER — MORPHINE SULFATE 2 MG/ML IJ SOLN
2.0000 mg | INTRAMUSCULAR | Status: DC | PRN
  Filled 2013-06-07: qty 1

## 2013-06-07 MED ORDER — AMPHETAMINE-DEXTROAMPHET ER 10 MG PO CP24
30.0000 mg | ORAL_CAPSULE | Freq: Every day | ORAL | Status: DC
  Administered 2013-06-08: 30 mg via ORAL
  Filled 2013-06-07: qty 3

## 2013-06-07 MED ORDER — CHLORHEXIDINE GLUCONATE CLOTH 2 % EX PADS
6.0000 | MEDICATED_PAD | Freq: Every day | CUTANEOUS | Status: DC
  Administered 2013-06-07 – 2013-06-08 (×2): 6 via TOPICAL

## 2013-06-07 NOTE — Progress Notes (Signed)
Pt on full dose Dilaudid PCA -ETCO2 >60 .  BP 73/45. Pt arousable and able to perform incentive spirometry which brought down ETCO2 to 50-55 range.  Pt also had not voided since 2300.  Dr. Kelly Splinter was notified and orders received.  PCA changed to reduced dose, IVF increased to 150cc/hour, and patient taken to bathroom where she voided 300cc.  Encouraged patient to continue deep breathing with incentive spirometer.

## 2013-06-07 NOTE — Progress Notes (Signed)
INITIAL NUTRITION ASSESSMENT  DOCUMENTATION CODES Per approved criteria  -Not Applicable   INTERVENTION: 1.  Supplements; Ensure Complete po TID prn, each supplement provides 350 kcal and 13 grams of protein.   NUTRITION DIAGNOSIS: Inadequate oral intake related to poor appetite as evidenced by pt report.   Monitor:  1.  Food/Beverage; pt meeting >/=90% estimated needs with tolerance. 2.  Wt/wt change; monitor trends  Reason for Assessment: MST  52 y.o. female  Admitting Dx: reconstructive breast surgery  ASSESSMENT: Pt admitted s/p reconstructive breast surgery.  Pt states she was diagnosed 1 year ago with breast cancer.  Over the past year she has completed chemo and radiation therapy.   Pt states that she has lost ~15-20 lbs over the past year (11% of her usual wt in 1 year).  She reports that with treatment she lost her appetite, but it has since stabilized.  She does feel her intake is overall less than her normal prior to cancer dx.  Pt feels she is eating well and that her wt is stable.   Pt reports she exercises 2-3 days/per at the gym and is "always running around" as she is involved in many activities.  Pt endorses dry mouth and preference of home meals.  She is willing to use Ensure prn for meal supplementation.  Discussed nutrition needs for healing.   Nutrition Focused Physical Exam: Subcutaneous Fat:  Orbital Region: WNL Upper Arm Region: WNL Thoracic and Lumbar Region: not assessed d/t surgical sites  Muscle:  Temple Region: WNL Clavicle Bone Region: WNL Clavicle and Acromion Bone Region: WNL Scapular Bone Region: WNL Dorsal Hand: WNL Patellar Region: WNL Anterior Thigh Region: WNL Posterior Calf Region: WNL  Edema: none present  Height: Ht Readings from Last 1 Encounters:  05/30/13 5\' 4"  (1.626 m)    Weight: Wt Readings from Last 1 Encounters:  05/30/13 113 lb 3.2 oz (51.347 kg)    Ideal Body Weight: 120 lbs  % Ideal Body Weight: 94%  Wt  Readings from Last 10 Encounters:  05/30/13 113 lb 3.2 oz (51.347 kg)  04/04/13 113 lb 4.8 oz (51.393 kg)  03/15/13 122 lb 9.6 oz (55.611 kg)  12/28/12 113 lb 6.4 oz (51.438 kg)  11/19/12 118 lb 3.2 oz (53.615 kg)  11/03/12 116 lb 3.2 oz (52.708 kg)  10/28/12 115 lb 8 oz (52.39 kg)  10/28/12 115 lb (52.164 kg)  10/26/12 115 lb (52.164 kg)  10/19/12 117 lb 3.2 oz (53.162 kg)    Usual Body Weight: 130-140 lbs per pt report >1 yr ago  % Usual Body Weight: 86%  BMI:  There is no weight on file to calculate BMI.  Estimated Nutritional Needs: Kcal: 1600-1800 Protein: 66-75g Fluid: >1.8 L/day  Skin:  Incisions to back, breast, shoulder with closed system drains  Diet Order: General  EDUCATION NEEDS: -Education needs addressed   Intake/Output Summary (Last 24 hours) at 06/07/13 1432 Last data filed at 06/07/13 1000  Gross per 24 hour  Intake 3882.91 ml  Output   1453 ml  Net 2429.91 ml    Last BM: PTA   Labs:   Recent Labs Lab 06/06/13 2310  NA 133*  K 4.4  CL 101  CO2 21  BUN 11  CREATININE 0.68  CALCIUM 8.3*  GLUCOSE 129*    CBG (last 3)  No results found for this basename: GLUCAP,  in the last 72 hours  Scheduled Meds: . amphetamine-dextroamphetamine  30 mg Oral Daily  . antiseptic oral rinse  15 mL Mouth Rinse q12n4p  . buPROPion  150 mg Oral Daily  . chlorhexidine  15 mL Mouth Rinse BID  . Chlorhexidine Gluconate Cloth  6 each Topical Daily  . cholecalciferol  5,000 Units Oral Daily  . docusate sodium  100 mg Oral TID  . [START ON 06/08/2013] influenza vac split quadrivalent PF  0.5 mL Intramuscular Tomorrow-1000  . mupirocin ointment  1 application Nasal BID  . OxyCODONE  10 mg Oral Q12H  . tamoxifen  20 mg Oral Daily  . traZODone  150 mg Oral QHS  . Vilazodone HCl  40 mg Oral Daily    Continuous Infusions: . dextrose 5 % and 0.45 % NaCl with KCl 20 mEq/L 150 mL/hr at 06/07/13 1610    Past Medical History  Diagnosis Date  . Anxiety    . Depression   . Breast cancer 05/05/12    right, ER +, PR -, Her 2 -  . Arthritis   . History of radiation therapy 07/14/2012-09/03/12    right chest wall 59.4 gray  . Sleep apnea     cpap  . H/O dizziness     Past Surgical History  Procedure Laterality Date  . Cesarean section      x3  . Joint replacement      Rt and Lt thumbs  . Cervical conization w/bx    . Breast surgery      several biopsies, needle aspiration  . Mastectomy w/ sentinel node biopsy  06/07/2012    Procedure: MASTECTOMY WITH SENTINEL LYMPH NODE BIOPSY;  Surgeon: Currie Paris, MD;  Location: [facility] OR;  Service: General;  Laterality: Right;  right total mastectomy and sentinel node  . Mastectomy modified radical  06/07/2012    Procedure: MASTECTOMY MODIFIED RADICAL;  Surgeon: Currie Paris, MD;  Location: [facility] OR;  Service: General;  Laterality: Right;  . Tubal ligation    . Simple mastectomy with axillary sentinel node biopsy Left 09/30/2012    Procedure: SIMPLE MASTECTOMY;  Surgeon: Currie Paris, MD;  Location: [facility];  Service: General;  Laterality: Left;  left total mastectomy    Loyce Dys, MS RD LDN Clinical Inpatient Dietitian Pager: [phone] Weekend/After hours pager: [phone]

## 2013-06-07 NOTE — Progress Notes (Signed)
1 Day Post-Op  Subjective: The patient is post operative day one and sleeping but doing well.  She voided this morning and was up in the chair.  Her blood pressure was low and the fluids were increased and pain meds decreased.  Objective: Vital signs in last 24 hours: Temp:  [97.4 F (36.3 C)-98.1 F (36.7 C)] 98 F (36.7 C) (11/11 0516) Pulse Rate:  [58-93] 90 (11/11 0516) Resp:  [7-20] 12 (11/11 0516) BP: (73-108)/(42-54) 73/45 mmHg (11/11 0516) SpO2:  [92 %-100 %] 97 % (11/11 0516)    Intake/Output from previous day: 11/10 0701 - 11/11 0700 In: 4882.9 [P.O.:60; I.V.:4072.9; IV Piggyback:750] Out: 1345 [Urine:705; Drains:460; Blood:150] Intake/Output this shift:    General appearance: alert, cooperative and no distress Incision/Wound:warm and good capillary refill. No sign of hematoma.  Lab Results:   Recent Labs  06/06/13 2310  WBC 9.5  HGB 11.3*  HCT 32.7*  PLT 130*   BMET  Recent Labs  06/06/13 2310  NA 133*  K 4.4  CL 101  CO2 21  GLUCOSE 129*  BUN 11  CREATININE 0.68  CALCIUM 8.3*   PT/INR No results found for this basename: LABPROT, INR,  in the last 72 hours ABG No results found for this basename: PHART, PCO2, PO2, HCO3,  in the last 72 hours  Studies/Results: No results found.  Anti-infectives: Anti-infectives   Start     Dose/Rate Route Frequency Ordered Stop   06/06/13 2200  Ampicillin-Sulbactam (UNASYN) 3 g in sodium chloride 0.9 % 100 mL IVPB     3 g 100 mL/hr over 60 Minutes Intravenous Every 6 hours 06/06/13 2044     06/06/13 1632  polymyxin B 500,000 Units, bacitracin 50,000 Units in sodium chloride irrigation 0.9 % 500 mL irrigation  Status:  Discontinued       As needed 06/06/13 1633 06/06/13 1816   06/06/13 0600  ceFAZolin (ANCEF) IVPB 2 g/50 mL premix     2 g 100 mL/hr over 30 Minutes Intravenous On call to O.R. 06/05/13 1411 06/06/13 1705      Assessment/Plan: s/p Procedure(s): RIGHT LATISSIMUS MYOCUTANEOUS FLAP WITH  PLACEMENT OF TISSUE EXPANDER IN RIGHT BREAST  (Right) PLACEMENT OF LEFT BREAST TISSUE EXPANDER AND FLEX HD  (Left) Advance diet  LOS: 1 day    [facility] 06/07/2013

## 2013-06-07 NOTE — Progress Notes (Signed)
PCA Dilaudid dc'd. Unable to document waste in pyxis. Wasted 10 ml in sink in the presence of Bettie Venita Sheffield, Charity fundraiser.Irena Reichmann

## 2013-06-08 ENCOUNTER — Encounter (HOSPITAL_COMMUNITY): Payer: Self-pay | Admitting: Plastic Surgery

## 2013-06-08 MED ORDER — MUPIROCIN 2 % EX OINT: 1.0000 "application " | TOPICAL_OINTMENT | Freq: Two times a day (BID) | CUTANEOUS | Status: DC

## 2013-06-08 MED ORDER — DIAZEPAM 2 MG PO TABS: 2.0000 mg | ORAL_TABLET | Freq: Two times a day (BID) | ORAL | Status: DC | PRN

## 2013-06-08 MED ORDER — DSS 100 MG PO CAPS: 100.0000 mg | ORAL_CAPSULE | Freq: Three times a day (TID) | ORAL | Status: DC

## 2013-06-08 MED ORDER — HYDROCODONE-ACETAMINOPHEN 7.5-325 MG/15ML PO SOLN: 10.0000 mL | ORAL | Status: DC | PRN

## 2013-06-08 NOTE — Progress Notes (Signed)
Discharge instructions reviewed with pt and pt's husband and prescriptions given.  Pt verbalized understanding and had no questions.  Pt also stated she has had JP drains in the past and knew how to empty and record drainage.  Pt discharged in stable condition via wheelchair with husband and friend.  Hector Shade [place]

## 2013-06-08 NOTE — Progress Notes (Signed)
2 Days Post-Op  Subjective: She is feeling much better today with better pain control and mobility. She is feeling ready to go home.  The right latissimus flap/left breast flap is viable and the incisions are all clean, dry and intact and without signs of infection. The JP drains are functioning well and serosang drainage which is moderate.   Objective: Vital signs in last 24 hours: Temp:  [98.1 F (36.7 C)-98.8 F (37.1 C)] 98.1 F (36.7 C) (11/12 1410) Pulse Rate:  [89-102] 89 (11/12 1410) Resp:  [17-18] 18 (11/12 1410) BP: (105-119)/(51-59) 105/51 mmHg (11/12 1410) SpO2:  [93 %-96 %] 96 % (11/12 1410) Weight:  [51.34 kg (113 lb 3 oz)] 51.34 kg (113 lb 3 oz) (11/11 2300) Last BM Date: 06/05/13  Intake/Output from previous day: 11/11 0701 - 11/12 0700 In: 4172.1 [P.O.:620; I.V.:3552.1] Out: 3138 [Urine:2650; Drains:488] Intake/Output this shift: Total I/O In: -  Out: 1085 [Urine:1000; Drains:85]  General appearance: alert, cooperative, appears stated age and no distress Resp: clear to auscultation bilaterally Cardio: regular rate and rhythm Bilateral breast flaps are viable and incisions are clean, dry and intact and without signs of infection  Lab Results:   Recent Labs  06/06/13 2310  WBC 9.5  HGB 11.3*  HCT 32.7*  PLT 130*   BMET  Recent Labs  06/06/13 2310  NA 133*  K 4.4  CL 101  CO2 21  GLUCOSE 129*  BUN 11  CREATININE 0.68  CALCIUM 8.3*   PT/INR No results found for this basename: LABPROT, INR,  in the last 72 hours ABG No results found for this basename: PHART, PCO2, PO2, HCO3,  in the last 72 hours  Studies/Results: No results found.  Anti-infectives: Anti-infectives   Start     Dose/Rate Route Frequency Ordered Stop   06/06/13 2200  Ampicillin-Sulbactam (UNASYN) 3 g in sodium chloride 0.9 % 100 mL IVPB  Status:  Discontinued     3 g 100 mL/hr over 60 Minutes Intravenous Every 6 hours 06/06/13 2044 06/07/13 0749   06/06/13 1632   polymyxin B 500,000 Units, bacitracin 50,000 Units in sodium chloride irrigation 0.9 % 500 mL irrigation  Status:  Discontinued       As needed 06/06/13 1633 06/06/13 1816   06/06/13 0600  ceFAZolin (ANCEF) IVPB 2 g/50 mL premix     2 g 100 mL/hr over 30 Minutes Intravenous On call to O.R. 06/05/13 1411 06/06/13 1705      Assessment/Plan: s/p Procedure(s): RIGHT LATISSIMUS MYOCUTANEOUS FLAP WITH PLACEMENT OF TISSUE EXPANDER IN RIGHT BREAST  (Right) PLACEMENT OF LEFT BREAST TISSUE EXPANDER AND FLEX HD  (Left) Discharge Home today follow up in office next Tuesday or sooner if needed.   LOS: 2 days    Franki Monte 06/08/2013 Plastic Surgery [phone]

## 2013-06-08 NOTE — Discharge Summary (Signed)
Physician Discharge Summary  Patient ID: Kelly [name] MRN: [id number] DOB/AGE: Apr 17, 1961 52 y.o.  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  History: She noted a change in her right breast and underwent a mammogram on 04/2012. There was a spiculated mass posteriorly and laterally to the nipple line in the right breast, and a second mass more medially. There was an asymmetric soft tissue density in the upper outer quadrant of the left breast. An ultrasound of the right breast showed a lobulated mass measuring 2.3 cm and a second mass measuring 4.5 cm. There was also a third mass measuring 1.9 cm which was felt possibly to represent a lymph node and the right axilla had a suspicious lymph node measuring 1.1 cm. 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    Bilateral breast MRI (05/10/2012) confirmed the 3 right breast masses, measuring 2.1, 2.8 (at the 7:00 position) and 2.2 cm and a 1.3 cm right axillary lymph node. There were no suspicious findings in the left breast. The fine-needle aspiration of the right axilla lymph node on 04/2012 was negative.    The patient underwent a right modified radial mastectomy on 06/07/2012. One of the masses was ER/PR positive and HER-2 negative. A total of 19 lymph nodes were sampled, of which 9 were positive. Margins were negative, but close (1 mm). She is 5 feet 3 inches tall, weighs 112 pounds and wore a 34B bra. She had 3 c-sections. She under went a left  mastectomy in March. She was radiated on the right but not the left and it ended in February. She was admitted for bilateral breast reconstruction with right latissimus flap and expander placement and left expander placement on 06/06/13. She underwent the procedure without complication and has done well post operatively with no untoward events. She is stable and improved and ready for discharge at this time. She will follow up in the office next week.    Consults: None  Significant Diagnostic Studies: None  Treatments: surgery:  DATE OF OPERATION: 06/06/2013  PREOPERATIVE DIAGNOSIS: Breast Cancer s/p bilateral mastectomies   POSTOPERATIVE DIAGNOSIS: Same  PROCEDURE: 1. Latissimus flap right to reconstruct the breast CPT 19361 2. Tissue expander placement CPT 19357     Discharge Exam: Blood pressure 105/51, pulse 89, temperature 98.1 F (36.7 C), temperature source Oral, resp. rate 18, height 5\' 4"  (1.626 m), weight 51.34 kg (113 lb 3 oz), SpO2 96.00%. General appearance: alert, cooperative, appears stated age and no distress Back: Right back incision is clean, dry and intact and without signs of infection Resp: clear to auscultation bilaterally Cardio: regular rate and rhythm Bilateral breast flaps are viable and incisions are all clean, dry and intact and without signs of infection  Disposition: 01-Home or Self Care  Discharge Orders   Future Appointments Provider Department Dept Phone   06/30/2013 2:30 PM [facility] [phone]   07/26/2013 2:30 PM Currie Paris, MD [facility] Surgery, [place] [phone]   09/29/2013 2:30 PM Krista [facility]  [phone]   10/06/2013 2:15 PM Amy Allegra Grana, PA-C [place] [facility] [phone]   12/29/2013 2:30 PM [facility] [place] [facility] [phone]   Future Orders Complete By Expires    Care order/instruction  As directed    Comments:     Instruct patient and husband how to empty JP drains and give sheets to record amounts       Medication List         amphetamine-dextroamphetamine 30 MG 24 hr capsule  Commonly known as:  ADDERALL XR  Take 30 mg by mouth daily.     Biotin 10 MG Tabs  Take 1 tablet by mouth daily.     buPROPion 150 MG 24 hr tablet  Commonly known as:  WELLBUTRIN XL  Take 150 mg by mouth daily.     diazepam 2 MG tablet  Commonly known as:  VALIUM  Take 1 tablet (2 mg total) by mouth every 12 (twelve) hours as needed for muscle spasms.     DSS 100 MG Caps  Take 100 mg by mouth 3 (three) times daily.     HYDROcodone-acetaminophen 7.5-325 mg/15 ml solution  Commonly known as:  HYCET  Take 10 mLs by mouth every 4 (four) hours as needed for moderate pain.     mupirocin ointment 2 %  Commonly known as:  BACTROBAN  Place 1 application into the nose 2 (two) times daily.     tamoxifen 20 MG tablet  Commonly known as:  NOLVADEX  Take 20 mg by mouth daily.     traZODone 50 MG tablet  Commonly known as:  DESYREL  Take 150 mg by mouth at bedtime.     VIIBRYD 40 MG Tabs  Generic drug:  Vilazodone HCl  Take 40 mg by mouth daily.     Vitamin D-3 5000 UNITS Tabs  Take 5,000 Units by mouth daily.     vitamin E 100 UNIT capsule  Take 100 Units by mouth daily.           Follow-up Information   Follow up with [facility], DO On 06/14/2013. (call to make an appointment for next Tuesday)    Specialty:  Plastic Surgery   Contact information:   [address] [place] [address] [phone]       Signed: Franki Monte 06/08/2013, 3:30 PM Plastic Surgery [phone]

## 2013-06-09 NOTE — Progress Notes (Signed)
I agree with the above information.

## 2013-06-09 NOTE — Discharge Summary (Signed)
I agree with the above information. 

## 2013-06-30 ENCOUNTER — Telehealth: Payer: Self-pay | Admitting: Oncology

## 2013-06-30 ENCOUNTER — Other Ambulatory Visit: Payer: BC Managed Care – PPO | Admitting: Lab

## 2013-06-30 NOTE — Telephone Encounter (Signed)
, °

## 2013-07-01 ENCOUNTER — Other Ambulatory Visit (HOSPITAL_BASED_OUTPATIENT_CLINIC_OR_DEPARTMENT_OTHER): Payer: BC Managed Care – PPO

## 2013-07-01 DIAGNOSIS — C50419 Malignant neoplasm of upper-outer quadrant of unspecified female breast: Secondary | ICD-10-CM

## 2013-07-01 DIAGNOSIS — C50911 Malignant neoplasm of unspecified site of right female breast: Secondary | ICD-10-CM

## 2013-07-01 DIAGNOSIS — C50519 Malignant neoplasm of lower-outer quadrant of unspecified female breast: Secondary | ICD-10-CM

## 2013-07-01 DIAGNOSIS — C50219 Malignant neoplasm of upper-inner quadrant of unspecified female breast: Secondary | ICD-10-CM

## 2013-07-01 LAB — CBC WITH DIFFERENTIAL/PLATELET
BASO%: 0.8 % (ref 0.0–2.0)
Basophils Absolute: 0 10*3/uL (ref 0.0–0.1)
EOS%: 10.7 % — ABNORMAL HIGH (ref 0.0–7.0)
Eosinophils Absolute: 0.7 10*3/uL — ABNORMAL HIGH (ref 0.0–0.5)
HCT: 37 % (ref 34.8–46.6)
HGB: 12.6 g/dL (ref 11.6–15.9)
LYMPH%: 10.8 % — ABNORMAL LOW (ref 14.0–49.7)
MCH: 31.6 pg (ref 25.1–34.0)
MCHC: 34 g/dL (ref 31.5–36.0)
MCV: 92.7 fL (ref 79.5–101.0)
MONO#: 0.4 10*3/uL (ref 0.1–0.9)
MONO%: 7.2 % (ref 0.0–14.0)
NEUT#: 4.3 10*3/uL (ref 1.5–6.5)
NEUT%: 70.5 % (ref 38.4–76.8)
Platelets: 260 10*3/uL (ref 145–400)
RBC: 3.99 10*6/uL (ref 3.70–5.45)
RDW: 13.1 % (ref 11.2–14.5)
WBC: 6.2 10*3/uL (ref 3.9–10.3)
lymph#: 0.7 10*3/uL — ABNORMAL LOW (ref 0.9–3.3)

## 2013-07-01 LAB — COMPREHENSIVE METABOLIC PANEL (CC13)
ALT: 16 U/L (ref 0–55)
AST: 22 U/L (ref 5–34)
Albumin: 3.3 g/dL — ABNORMAL LOW (ref 3.5–5.0)
Alkaline Phosphatase: 97 U/L (ref 40–150)
Anion Gap: 8 mEq/L (ref 3–11)
BUN: 13.8 mg/dL (ref 7.0–26.0)
CO2: 30 mEq/L — ABNORMAL HIGH (ref 22–29)
Calcium: 9.4 mg/dL (ref 8.4–10.4)
Chloride: 104 mEq/L (ref 98–109)
Creatinine: 0.8 mg/dL (ref 0.6–1.1)
Glucose: 87 mg/dl (ref 70–140)
Potassium: 4.6 mEq/L (ref 3.5–5.1)
Sodium: 142 mEq/L (ref 136–145)
Total Bilirubin: 0.54 mg/dL (ref 0.20–1.20)
Total Protein: 6.8 g/dL (ref 6.4–8.3)

## 2013-07-02 LAB — CANCER ANTIGEN 27.29: CA 27.29: 22 U/mL (ref 0–39)

## 2013-07-05 ENCOUNTER — Encounter: Payer: Self-pay | Admitting: Physician Assistant

## 2013-07-07 ENCOUNTER — Encounter: Payer: Self-pay | Admitting: Physician Assistant

## 2013-07-07 NOTE — Telephone Encounter (Signed)
VERBAL ORDER AND READ BACK TO AMY BERRY,PA- MAY GIVE PT. THE CA-27.29 RESULTS OF 22. NOTIFIED PT. OF THE CA-27.29 RESULTS OF 22. SHE VOICES UNDERSTANDING.

## 2013-07-14 ENCOUNTER — Telehealth: Payer: Self-pay | Admitting: *Deleted

## 2013-07-14 NOTE — Telephone Encounter (Signed)
ERROR

## 2013-07-15 ENCOUNTER — Ambulatory Visit (INDEPENDENT_AMBULATORY_CARE_PROVIDER_SITE_OTHER): Payer: BC Managed Care – PPO | Admitting: Surgery

## 2013-07-26 ENCOUNTER — Ambulatory Visit (INDEPENDENT_AMBULATORY_CARE_PROVIDER_SITE_OTHER): Payer: BC Managed Care – PPO | Admitting: Surgery

## 2013-08-15 ENCOUNTER — Telehealth: Payer: Self-pay | Admitting: *Deleted

## 2013-08-15 NOTE — Telephone Encounter (Signed)
Pt called to scheduling to request an appointment due to onset of low skull- neck pain, call was transferred to this RN to discuss pt's concerns.  Per phone conversation Kelly [name] states she has has for the past 3 weeks pain " in the area at the base of my skull on each side of my vertabrae "  She says she also feels the lymphnode near her clavicle is swollen.  She denies any upper respiratory or sinus issues.  Above is fairly constant.  Pt states noted mild nausea but not dizziness or other symptoms.  Return call number for pt is [phone].  This note will be given to MD for review and recommendation.

## 2013-08-16 ENCOUNTER — Telehealth: Payer: Self-pay | Admitting: Oncology

## 2013-08-16 ENCOUNTER — Ambulatory Visit (HOSPITAL_BASED_OUTPATIENT_CLINIC_OR_DEPARTMENT_OTHER): Payer: BC Managed Care – PPO | Admitting: Hematology and Oncology

## 2013-08-16 VITALS — BP 134/74 | HR 94 | Temp 98.3°F | Resp 18 | Ht 64.0 in | Wt 116.4 lb

## 2013-08-16 DIAGNOSIS — C50519 Malignant neoplasm of lower-outer quadrant of unspecified female breast: Secondary | ICD-10-CM

## 2013-08-16 DIAGNOSIS — C50911 Malignant neoplasm of unspecified site of right female breast: Secondary | ICD-10-CM

## 2013-08-16 DIAGNOSIS — M542 Cervicalgia: Secondary | ICD-10-CM

## 2013-08-16 DIAGNOSIS — C50419 Malignant neoplasm of upper-outer quadrant of unspecified female breast: Secondary | ICD-10-CM

## 2013-08-16 DIAGNOSIS — C773 Secondary and unspecified malignant neoplasm of axilla and upper limb lymph nodes: Secondary | ICD-10-CM

## 2013-08-16 DIAGNOSIS — C50219 Malignant neoplasm of upper-inner quadrant of unspecified female breast: Secondary | ICD-10-CM

## 2013-08-16 DIAGNOSIS — C50919 Malignant neoplasm of unspecified site of unspecified female breast: Secondary | ICD-10-CM

## 2013-08-16 DIAGNOSIS — R21 Rash and other nonspecific skin eruption: Secondary | ICD-10-CM

## 2013-08-16 NOTE — Progress Notes (Signed)
ID: Ihor Dow   DOB: 06-02-1961  MR#: [id number]  IEP#:[id number]  PCP:  Darcus Austin GYN: Lyman Speller, SU: Neldon Mc OTHER MD: Gery Pray, Gypsy Balsam, Lyndee Leo Sanger  Diagnosis: Right breast cancer  Chief complaint: Neck pain   HISTORY OF PRESENT ILLNESS: As documented : The patient herself noted a change in her right breast. She brought it to her primary care physician, Dr. Carron Brazen attention, and diagnostic mammography was obtained at [facility] 05/05/2012. The breasts were found to be heterogeneously dense. There was a spiculated mass posteriorly and laterally to the nipple line in the right breast, and a second mass more medially. There was an asymmetric soft tissue density in the upper outer quadrant of the left breast, without any discrete mass identified. There was a cluster of microcalcifications medially in the left breast. Ultrasound of the right breast showed a lobulated mass measuring 2.3 cm and a second mass measuring 4.5 cm. There was also a third mass measuring 1.9 cm which was felt possibly to represent a lymph node. In the right axilla there was a suspicious lymph node measuring 1.1 cm.  Biopsy of this 3 breast masses was obtained the same day, and showed all 3 to represent invasive ductal carcinoma, grade 1. Because the 3 lesions were morphologically similar, only one prognostic panel was obtained (from the mass at "7:00"), showing it to be estrogen receptor 100% positive, progesterone receptor negative, with an MIB-1 of 12%, and no HER-2 amplification ([id number]).  Bilateral breast MRI was obtained 05/10/2012, and confirmed the 3 right breast masses in question, measuring 2.1, 2.8 (at the 7:00 position) and 2.2 cm. In addition a 1.3 cm right axillary lymph node was noted. There were no suspicious findings in the left breast. On October 30 fine-needle aspiration of the suspicious right axilla lymph node was performed, with the results being  negative ([id number]).   Finally on 06/07/2012 the patient underwent right modified radical mastectomy. The three masses in the right breast measured 2.2 cm, 3 cm, and 2.3 cm ([id number]). The 2.3 cm mass was morphologically slightly different (grade 2) and a prognostic profile was obtained from this mass ("superior mid-medial"). It was 100% estrogen and 98% progesterone receptor positive. MIB-1 was 16%. There was no HER-2 amplification. A total of 19 lymph nodes were sampled, of which 9 were positive. Margins were negative, but close (1 mm). The patient's subsequent history is as detailed below.  INTERVAL HISTORY: Ms. Cushman is here for followup visit today. She complains of neck pain which started approximately 2-3 weeks ago in both the cervical-occipital and supraclavicular areas. She says that she hasn't changed the pillows recently and no heavy weightlifting no history of recent falls. She also says that she noticed a tiny lymph node in the left side of the neck. She just noticed a skin rash in her right gluteal area and it's not itching  and no pain in the  skin rash area. She says she is in the process of having reconstructive surgery for that she sees the plastic surgery once every 3 weeks. She is tolerating tamoxifen well with minimal hot flashes.   REVIEW OF SYSTEMS: A 14 review systems is been assessed and the pertinent findings are listed in interval history    PAST MEDICAL HISTORY: Past Medical History  Diagnosis Date  . Anxiety   . Depression   . Breast cancer 05/05/12    right, ER +, PR -, Her 2 -  . Arthritis   .  History of radiation therapy 07/14/2012-09/03/12    right chest wall 59.4 gray  . Sleep apnea     cpap  . H/O dizziness    sleep apnea; migraines;  PAST SURGICAL HISTORY: Past Surgical History  Procedure Laterality Date  . Cesarean section      x3  . Joint replacement      Rt and Lt thumbs  . Cervical conization w/bx    . Breast surgery      several  biopsies, needle aspiration  . Mastectomy w/ sentinel node biopsy  06/07/2012    Procedure: MASTECTOMY WITH SENTINEL LYMPH NODE BIOPSY;  Surgeon: Haywood Lasso, MD;  Location: [place];  Service: General;  Laterality: Right;  right total mastectomy and sentinel node  . Mastectomy modified radical  06/07/2012    Procedure: MASTECTOMY MODIFIED RADICAL;  Surgeon: Haywood Lasso, MD;  Location: [place];  Service: General;  Laterality: Right;  . Tubal ligation    . Simple mastectomy with axillary sentinel node biopsy Left 09/30/2012    Procedure: SIMPLE MASTECTOMY;  Surgeon: Haywood Lasso, MD;  Location: [place];  Service: General;  Laterality: Left;  left total mastectomy  . Latissimus flap to breast Right 06/06/2013    Procedure: RIGHT LATISSIMUS MYOCUTANEOUS FLAP WITH PLACEMENT OF TISSUE EXPANDER IN RIGHT BREAST ;  Surgeon: Theodoro Kos, DO;  Location: [place];  Service: Plastics;  Laterality: Right;  . Breast reconstruction with placement of tissue expander and flex hd (acellular hydrated dermis) Left 06/06/2013    Procedure: PLACEMENT OF LEFT BREAST TISSUE EXPANDER AND FLEX HD ;  Surgeon: Theodoro Kos, DO;  Location: [place];  Service: Plastics;  Laterality: Left;    FAMILY HISTORY Family History  Problem Relation Age of Onset  . Breast cancer Mother 11  . Lymphoma Mother 76    non-hodgkins lymphoma in her epiglottis  . Cancer Mother     non-hodkins lymphoma, breast  . Stroke Maternal Grandfather   . Bipolar disorder Brother   . Gout Brother   . Breast cancer Other     MGM's sister; dx <50  . Breast cancer Other 66    MGF's sister  . Brain cancer Other     MGM's sister; dx <50  . Leukemia Other     mother's maternal cousin; dx under 13  the patient's mother, Alya Smaltz, is also my patient and has a history of breast cancer. The patient's father was adopted. Kamaria has 2 brothers, no sisters. There is no history of ovarian cancer in the  family.  GYNECOLOGIC HISTORY: She had menarche age 30, first live birth age 39. She is GX P3. Her periods are regular.  SOCIAL HISTORY: Tenae volunteers at the [employer], at church [facility]) and for other causes. Her husband Elta Guadeloupe                         . Their children are currently 29 years old (doing the [place] trail), 24 (freshman at [employer]), and 11.   ADVANCED DIRECTIVES: Not in place  HEALTH MAINTENANCE: History  Substance Use Topics  . Smoking status: Never Smoker   . Smokeless tobacco: Never Used  . Alcohol Use: Yes     Comment: socially    Colonoscopy:  PAP:  Bone density: 08/11/2012 at [facility], osteopenia  Lipid panel: August 2013, Dr. Inis Sizer  Allergies  Allergen Reactions  . Doxycycline Rash    Current Outpatient Prescriptions  Medication  Sig Dispense Refill  . amphetamine-dextroamphetamine (ADDERALL XR) 30 MG 24 hr capsule Take 30 mg by mouth daily.       Marland Kitchen buPROPion (WELLBUTRIN XL) 150 MG 24 hr tablet Take 150 mg by mouth daily.       Marland Kitchen lidocaine-prilocaine (EMLA) cream Apply 45 to 60 minutes      . tamoxifen (NOLVADEX) 20 MG tablet Take 20 mg by mouth daily.      . traZODone (DESYREL) 50 MG tablet Take 150 mg by mouth at bedtime.      Marland Kitchen VIIBRYD 40 MG TABS Take 40 mg by mouth daily.       . Biotin 10 MG TABS Take 1 tablet by mouth daily.      . Cholecalciferol (VITAMIN D-3) 5000 UNITS TABS Take 5,000 Units by mouth daily.      Marland Kitchen docusate sodium 100 MG CAPS Take 100 mg by mouth 3 (three) times daily.  10 capsule  0  . vitamin E 100 UNIT capsule Take 100 Units by mouth daily.       No current facility-administered medications for this visit.    OBJECTIVE: Middle-aged white woman in no acute distress Filed Vitals:   08/16/13 1551  BP: 134/74  Pulse: 94  Temp: 98.3 F (36.8 C)  Resp: 18     Body mass index is 19.97 kg/(m^2).    ECOG FS: 0 Filed Weights   08/16/13 1551  Weight: 116 lb 6.4 oz (52.799 kg)     Sclerae unicteric Oropharynx clear  Lymph node examination: lymph node approximately 0.4cm centimeter in size, freely mobile, nontender appreciated in the left anterior cervical area.  Lungs clear to auscultation bilaterally, no rales or rhonchi Heart regular rate and rhythm, no murmur appreciated Abd soft, nontender, positive bowel sounds MSK no focal spinal tenderness, no peripheral edema Neuro: nonfocal, well oriented, friendly affect Breasts: Bilateral breast reconstructive surgeries are in progress  Neck examination: Patient has tenderness in the cervical-occipital area and supraclavicular areas Skin examination: erythematous rash noted in the right gluteal area    LAB RESULTS: Lab Results  Component Value Date   WBC 6.2 07/01/2013   NEUTROABS 4.3 07/01/2013   HGB 12.6 07/01/2013   HCT 37.0 07/01/2013   MCV 92.7 07/01/2013   PLT 260 07/01/2013      Chemistry      Component Value Date/Time   NA 142 07/01/2013 1013   NA 133* 06/06/2013 2310   K 4.6 07/01/2013 1013   K 4.4 06/06/2013 2310   CL 101 06/06/2013 2310   CL 102 12/27/2012 0935   CO2 30* 07/01/2013 1013   CO2 21 06/06/2013 2310   BUN 13.8 07/01/2013 1013   BUN 11 06/06/2013 2310   CREATININE 0.8 07/01/2013 1013   CREATININE 0.68 06/06/2013 2310      Component Value Date/Time   CALCIUM 9.4 07/01/2013 1013   CALCIUM 8.3* 06/06/2013 2310   ALKPHOS 97 07/01/2013 1013   ALKPHOS 87 10/18/2012 1952   AST 22 07/01/2013 1013   AST 37 10/18/2012 1952   ALT 16 07/01/2013 1013   ALT 25 10/18/2012 1952   BILITOT 0.54 07/01/2013 1013   BILITOT 1.0 10/18/2012 1952       Lab Results  Component Value Date   LABCA2 22 07/01/2013     STUDIES: No results found.   ASSESSMENT: 53 y.o. BRCA negative [place] woman status post right modified radical mastectomy 06/07/2012 for a multifocal invasive ductal carcinoma, the largest of 3 masses measuring  3.0 cm, grade 1; a second mass measuring 2.3 cm, grade 2; and a third mass  measuring 2.2 cm, grade 1. The 2 larger masses were both 100% estrogen receptor positive, the grade 2 mass being also progesterone receptor positive at 98%, with an MIB-1 of 16% (the larger mass was estrogen receptor negative, with an Mib-1 of 12%). Both showed no HER-2 amplification. 9 of 19 axillary lymph nodes sampled were involved. In summary:  (1) right-sided mpT2 pN2a or stage IIIA invasive ductal carcinoma, grade 1-2, estrogen receptor positive, HER-2 not amplified, with an MIB-1 of 12-16%  (2) the patient opted to forego adjuvant chemotherapy  (3) adjuvant radiation therapy completed 09/03/2012   (4) on tamoxifen as of February 2014  (5) left simple mastectomy 09/30/2012, with benign pathology; complicated by an infected seroma  (6) patient in the process of having breast reconstructive surgery.   (7) neck pain most likely secondary to muscle strain/DJD/ rule out vitamin D deficiency: We'll obtain a vitamin D level and also follow up with a CAT scan ordered  (8) left anterior cervical lymphadenopathy: Will order a CT of the neck to assess for lymphadenopathy  (9) right gluteal skin rash: The patient is asymptomatic at present. I've asked the patient to follow up with primary care physician if it gets worse  PLAN:  #1 Continue tamoxifen #2 follow up with gynecology for annual pelvic exam and Pap smears while she is on tamoxifen #3 CBC differential, CMP and a vitamin D level today #4 CT of the neck has been ordered to assess for lymphadenopathy #5 follow up with Dr. Migdalia Dk a scheduled #6 follow up with primary care physician, if the skin rash gets worse   Wilmon Arms, MD Medical Oncology      08/16/2013

## 2013-08-16 NOTE — Telephone Encounter (Signed)
gv pt appt schedule for jan thru june. pt will return tomorrow for lb as lb is closed today. central will contact pt w/ct appt - pt aware.

## 2013-08-17 ENCOUNTER — Other Ambulatory Visit (HOSPITAL_BASED_OUTPATIENT_CLINIC_OR_DEPARTMENT_OTHER): Payer: BC Managed Care – PPO

## 2013-08-17 DIAGNOSIS — C50919 Malignant neoplasm of unspecified site of unspecified female breast: Secondary | ICD-10-CM

## 2013-08-17 DIAGNOSIS — C50519 Malignant neoplasm of lower-outer quadrant of unspecified female breast: Secondary | ICD-10-CM

## 2013-08-17 DIAGNOSIS — C50419 Malignant neoplasm of upper-outer quadrant of unspecified female breast: Secondary | ICD-10-CM

## 2013-08-17 DIAGNOSIS — M542 Cervicalgia: Secondary | ICD-10-CM

## 2013-08-17 DIAGNOSIS — C773 Secondary and unspecified malignant neoplasm of axilla and upper limb lymph nodes: Secondary | ICD-10-CM

## 2013-08-17 DIAGNOSIS — C50219 Malignant neoplasm of upper-inner quadrant of unspecified female breast: Secondary | ICD-10-CM

## 2013-08-17 LAB — COMPREHENSIVE METABOLIC PANEL (CC13)
ALT: 24 U/L (ref 0–55)
AST: 26 U/L (ref 5–34)
Albumin: 4 g/dL (ref 3.5–5.0)
Alkaline Phosphatase: 98 U/L (ref 40–150)
Anion Gap: 9 mEq/L (ref 3–11)
BUN: 15.4 mg/dL (ref 7.0–26.0)
CO2: 30 mEq/L — ABNORMAL HIGH (ref 22–29)
Calcium: 10.1 mg/dL (ref 8.4–10.4)
Chloride: 101 mEq/L (ref 98–109)
Creatinine: 0.9 mg/dL (ref 0.6–1.1)
Glucose: 88 mg/dl (ref 70–140)
Potassium: 4.3 mEq/L (ref 3.5–5.1)
Sodium: 140 mEq/L (ref 136–145)
Total Bilirubin: 0.84 mg/dL (ref 0.20–1.20)
Total Protein: 7.7 g/dL (ref 6.4–8.3)

## 2013-08-17 LAB — CBC WITH DIFFERENTIAL/PLATELET
BASO%: 0.2 % (ref 0.0–2.0)
Basophils Absolute: 0 10*3/uL (ref 0.0–0.1)
EOS%: 2.4 % (ref 0.0–7.0)
Eosinophils Absolute: 0.2 10*3/uL (ref 0.0–0.5)
HCT: 43.3 % (ref 34.8–46.6)
HGB: 14.2 g/dL (ref 11.6–15.9)
LYMPH%: 12.7 % — ABNORMAL LOW (ref 14.0–49.7)
MCH: 29.6 pg (ref 25.1–34.0)
MCHC: 32.8 g/dL (ref 31.5–36.0)
MCV: 90.4 fL (ref 79.5–101.0)
MONO#: 0.5 10*3/uL (ref 0.1–0.9)
MONO%: 7.4 % (ref 0.0–14.0)
NEUT#: 4.8 10*3/uL (ref 1.5–6.5)
NEUT%: 77.3 % — ABNORMAL HIGH (ref 38.4–76.8)
Platelets: 267 10*3/uL (ref 145–400)
RBC: 4.79 10*6/uL (ref 3.70–5.45)
RDW: 13.1 % (ref 11.2–14.5)
WBC: 6.2 10*3/uL (ref 3.9–10.3)
lymph#: 0.8 10*3/uL — ABNORMAL LOW (ref 0.9–3.3)

## 2013-08-18 LAB — VITAMIN D 25 HYDROXY (VIT D DEFICIENCY, FRACTURES): Vit D, 25-Hydroxy: 68 ng/mL (ref 30–89)

## 2013-08-23 ENCOUNTER — Ambulatory Visit (HOSPITAL_COMMUNITY)
Admission: RE | Admit: 2013-08-23 | Discharge: 2013-08-23 | Disposition: A | Discharge status: Home / Self Care | Payer: BC Managed Care – PPO | Source: Ambulatory Visit | Attending: Hematology and Oncology | Admitting: Hematology and Oncology

## 2013-08-23 ENCOUNTER — Encounter (HOSPITAL_COMMUNITY): Payer: Self-pay

## 2013-08-23 DIAGNOSIS — C50919 Malignant neoplasm of unspecified site of unspecified female breast: Secondary | ICD-10-CM | POA: Insufficient documentation

## 2013-08-23 DIAGNOSIS — M542 Cervicalgia: Secondary | ICD-10-CM | POA: Insufficient documentation

## 2013-08-23 DIAGNOSIS — M259 Joint disorder, unspecified: Secondary | ICD-10-CM | POA: Insufficient documentation

## 2013-08-23 DIAGNOSIS — M948X9 Other specified disorders of cartilage, unspecified sites: Secondary | ICD-10-CM | POA: Insufficient documentation

## 2013-08-23 DIAGNOSIS — M898X9 Other specified disorders of bone, unspecified site: Secondary | ICD-10-CM | POA: Insufficient documentation

## 2013-08-23 DIAGNOSIS — R599 Enlarged lymph nodes, unspecified: Secondary | ICD-10-CM | POA: Insufficient documentation

## 2013-08-23 MED ORDER — IOHEXOL 300 MG/ML  SOLN
100.0000 mL | Freq: Once | INTRAMUSCULAR | Status: AC | PRN
  Administered 2013-08-23: 100 mL via INTRAVENOUS

## 2013-08-24 ENCOUNTER — Telehealth: Payer: Self-pay | Admitting: *Deleted

## 2013-08-24 NOTE — Telephone Encounter (Signed)
Received message from patient that she would like someone to call her and review CT results and lab results. I reviewed results with Dr. Earnest Conroy. Per Dr. Earnest Conroy, CT of neck is normal. The labs from 08/17/13 are normal. Dr. Earnest Conroy, recommends patient calling her PCP and let them make a referral for physical therapy if it is needed. All questions answered and patient verbalized understanding.

## 2013-09-23 ENCOUNTER — Telehealth: Payer: Self-pay | Admitting: *Deleted

## 2013-09-23 NOTE — Telephone Encounter (Signed)
Called pt to inform her of the 3/12 appt. Communicated with pt that because this would be a F/U appt about the CT of the neck she had done in Jan and  Dr. Earnest Conroy has already  discussed the results with her she doesn't need to be seen on 3/12. I told pt to still come to lab appt only on 3/5 @ 2:30p.I told pt she is on a 6 mo f/u schedule and will see Dr. Jana Hakim in June. Pt agreed to these changes and I told her we will call her with lab results after 3/5 labs. Pt verbalized understanding and had no further concerns. Message to be forwarded to [employer], PA-C.

## 2013-09-26 ENCOUNTER — Telehealth: Payer: Self-pay | Admitting: *Deleted

## 2013-09-26 NOTE — Telephone Encounter (Signed)
Per PA-C request. Communicated with pt to keep her appts as scheduled since she is having more discomfort to the entire neck area, then we will discuss 6 mo follow-up at that time. Pt verbalized understanding. No further concerns. Message to be forwarded to [employer], PA-C.

## 2013-09-29 ENCOUNTER — Other Ambulatory Visit (HOSPITAL_BASED_OUTPATIENT_CLINIC_OR_DEPARTMENT_OTHER): Payer: BC Managed Care – PPO

## 2013-09-29 DIAGNOSIS — C50911 Malignant neoplasm of unspecified site of right female breast: Secondary | ICD-10-CM

## 2013-09-29 DIAGNOSIS — C50919 Malignant neoplasm of unspecified site of unspecified female breast: Secondary | ICD-10-CM

## 2013-09-29 LAB — CBC WITH DIFFERENTIAL/PLATELET
BASO%: 0.4 % (ref 0.0–2.0)
Basophils Absolute: 0 10*3/uL (ref 0.0–0.1)
EOS%: 1.9 % (ref 0.0–7.0)
Eosinophils Absolute: 0.1 10*3/uL (ref 0.0–0.5)
HCT: 39.7 % (ref 34.8–46.6)
HGB: 13.4 g/dL (ref 11.6–15.9)
LYMPH%: 11.2 % — ABNORMAL LOW (ref 14.0–49.7)
MCH: 29.8 pg (ref 25.1–34.0)
MCHC: 33.7 g/dL (ref 31.5–36.0)
MCV: 88.4 fL (ref 79.5–101.0)
MONO#: 0.4 10*3/uL (ref 0.1–0.9)
MONO%: 7.2 % (ref 0.0–14.0)
NEUT#: 4.8 10*3/uL (ref 1.5–6.5)
NEUT%: 79.3 % — ABNORMAL HIGH (ref 38.4–76.8)
Platelets: 255 10*3/uL (ref 145–400)
RBC: 4.49 10*6/uL (ref 3.70–5.45)
RDW: 13.9 % (ref 11.2–14.5)
WBC: 6.1 10*3/uL (ref 3.9–10.3)
lymph#: 0.7 10*3/uL — ABNORMAL LOW (ref 0.9–3.3)

## 2013-09-29 LAB — COMPREHENSIVE METABOLIC PANEL (CC13)
ALT: 18 U/L (ref 0–55)
AST: 22 U/L (ref 5–34)
Albumin: 3.6 g/dL (ref 3.5–5.0)
Alkaline Phosphatase: 86 U/L (ref 40–150)
Anion Gap: 9 mEq/L (ref 3–11)
BUN: 17.5 mg/dL (ref 7.0–26.0)
CO2: 31 mEq/L — ABNORMAL HIGH (ref 22–29)
Calcium: 9.3 mg/dL (ref 8.4–10.4)
Chloride: 104 mEq/L (ref 98–109)
Creatinine: 0.8 mg/dL (ref 0.6–1.1)
Glucose: 113 mg/dl (ref 70–140)
Potassium: 3.9 mEq/L (ref 3.5–5.1)
Sodium: 143 mEq/L (ref 136–145)
Total Bilirubin: 0.41 mg/dL (ref 0.20–1.20)
Total Protein: 6.9 g/dL (ref 6.4–8.3)

## 2013-09-29 LAB — CANCER ANTIGEN 27.29: CA 27.29: 24 U/mL (ref 0–39)

## 2013-10-05 ENCOUNTER — Other Ambulatory Visit: Payer: Self-pay | Admitting: Physician Assistant

## 2013-10-06 ENCOUNTER — Ambulatory Visit (HOSPITAL_BASED_OUTPATIENT_CLINIC_OR_DEPARTMENT_OTHER): Payer: BC Managed Care – PPO

## 2013-10-06 ENCOUNTER — Ambulatory Visit: Payer: BC Managed Care – PPO | Admitting: Physician Assistant

## 2013-10-06 ENCOUNTER — Telehealth: Payer: Self-pay | Admitting: Obstetrics & Gynecology

## 2013-10-06 ENCOUNTER — Ambulatory Visit (HOSPITAL_BASED_OUTPATIENT_CLINIC_OR_DEPARTMENT_OTHER): Payer: BC Managed Care – PPO | Admitting: Hematology and Oncology

## 2013-10-06 VITALS — BP 111/74 | HR 93 | Temp 97.2°F | Resp 18 | Ht 64.0 in | Wt 117.0 lb

## 2013-10-06 DIAGNOSIS — C50519 Malignant neoplasm of lower-outer quadrant of unspecified female breast: Secondary | ICD-10-CM

## 2013-10-06 DIAGNOSIS — R102 Pelvic and perineal pain: Secondary | ICD-10-CM

## 2013-10-06 DIAGNOSIS — C773 Secondary and unspecified malignant neoplasm of axilla and upper limb lymph nodes: Secondary | ICD-10-CM

## 2013-10-06 DIAGNOSIS — Z8614 Personal history of Methicillin resistant Staphylococcus aureus infection: Secondary | ICD-10-CM

## 2013-10-06 DIAGNOSIS — M542 Cervicalgia: Secondary | ICD-10-CM

## 2013-10-06 DIAGNOSIS — C679 Malignant neoplasm of bladder, unspecified: Secondary | ICD-10-CM

## 2013-10-06 DIAGNOSIS — N949 Unspecified condition associated with female genital organs and menstrual cycle: Secondary | ICD-10-CM

## 2013-10-06 DIAGNOSIS — M549 Dorsalgia, unspecified: Secondary | ICD-10-CM

## 2013-10-06 DIAGNOSIS — Z170 Estrogen receptor positive status [ER+]: Secondary | ICD-10-CM

## 2013-10-06 DIAGNOSIS — C50911 Malignant neoplasm of unspecified site of right female breast: Secondary | ICD-10-CM

## 2013-10-06 DIAGNOSIS — C50419 Malignant neoplasm of upper-outer quadrant of unspecified female breast: Secondary | ICD-10-CM

## 2013-10-06 DIAGNOSIS — C50219 Malignant neoplasm of upper-inner quadrant of unspecified female breast: Secondary | ICD-10-CM

## 2013-10-06 LAB — URINALYSIS, MICROSCOPIC - CHCC
Bilirubin (Urine): NEGATIVE
Blood: NEGATIVE
Glucose: NEGATIVE mg/dL
Ketones: NEGATIVE mg/dL
Leukocyte Esterase: NEGATIVE
Nitrite: NEGATIVE
Protein: NEGATIVE mg/dL
RBC / HPF: NEGATIVE (ref 0–2)
Specific Gravity, Urine: 1.025 (ref 1.003–1.035)
Urobilinogen, UR: 0.2 mg/dL (ref 0.2–1)
pH: 6.5 (ref 4.6–8.0)

## 2013-10-06 NOTE — Telephone Encounter (Signed)
Patient is overdue for an AEX. She wants to see Dr. Sabra Heck and is waiting for a cancellation. She has been focusing on treating her breast cancer.  I have this patient on my list for any openings with Dr. Sabra Heck and will call her promptly if something opens up soon. The patient chose to schedule with Dr. Charlies Constable for 10/12/13 to be seen as soon as possible. Please let me know so I can call the patient if any slots become available with Dr. Sabra Heck.  Cc: Dr. Sabra Heck

## 2013-10-09 LAB — MRSA CULTURE

## 2013-10-09 NOTE — Telephone Encounter (Signed)
I think there is room on the 17th in the AM unless I'm looking at the schedule incorrectly.  Whatever appt you give her, block the one after it please.

## 2013-10-10 ENCOUNTER — Ambulatory Visit: Payer: Self-pay | Admitting: Obstetrics & Gynecology

## 2013-10-10 ENCOUNTER — Ambulatory Visit (INDEPENDENT_AMBULATORY_CARE_PROVIDER_SITE_OTHER): Payer: BC Managed Care – PPO | Admitting: Obstetrics & Gynecology

## 2013-10-10 ENCOUNTER — Encounter: Payer: Self-pay | Admitting: Obstetrics & Gynecology

## 2013-10-10 VITALS — BP 112/64 | HR 64 | Resp 16 | Ht 64.75 in | Wt 117.0 lb

## 2013-10-10 DIAGNOSIS — Z01419 Encounter for gynecological examination (general) (routine) without abnormal findings: Secondary | ICD-10-CM

## 2013-10-10 DIAGNOSIS — Z124 Encounter for screening for malignant neoplasm of cervix: Secondary | ICD-10-CM

## 2013-10-10 DIAGNOSIS — Z23 Encounter for immunization: Secondary | ICD-10-CM

## 2013-10-10 NOTE — Progress Notes (Addendum)
53 y.o. Kelly [name] MarriedCaucasianF here for annual exam.  No vaginal bleeding in almost two years.  Breast cancer diagnosed 2014.  Being seen at [facility] every six months.  Oncologist has shifted some because of the changes at the [facility].  Has bone scan next week because of bone pain she is having. Going to have further reconstruction to have breasts made very even--seeing Dr. Lyndee Leo Sanger at [facility].    Patient did do genetic testing.  (Her mother had breast cancer as well.)  Genetic testing was negative.  Patient's last menstrual period was 08/29/2011.          Sexually active: yes  The current method of family planning is tubal ligation.    Exercising: yes  cardio, yoga, and weights Smoker:  no  Health Maintenance: Pap:  09/25/10 WNL History of abnormal Pap:  yes MMG:  05/02/12 Colonoscopy:  Age 78-repeat in 10 years BMD:   08/11/12-normal TDaP:  Probably up to date Screening Labs: oncologist, Hb today: oncologist, Urine today: oncologist   reports that she has never smoked. She has never used smokeless tobacco. She reports that she drinks about 0.5 ounces of alcohol per week. She reports that she does not use illicit drugs.  Past Medical History  Diagnosis Date  . Anxiety   . Depression   . Breast cancer 05/05/12    right, ER +, PR -, Her 2 -  . Arthritis   . History of radiation therapy 07/14/2012-09/03/12    right chest wall 59.4 gray  . Sleep apnea     cpap  . H/O dizziness   . Abnormal Pap smear of cervix     with conization    Past Surgical History  Procedure Laterality Date  . Cesarean section      x3  . Joint replacement      Rt and Lt thumbs  . Cervical conization w/bx    . Breast surgery      several biopsies, needle aspiration  . Mastectomy w/ sentinel node biopsy  06/07/2012    Procedure: MASTECTOMY WITH SENTINEL LYMPH NODE BIOPSY;  Surgeon: Haywood Lasso, MD;  Location: [place];  Service: General;  Laterality: Right;  right total  mastectomy and sentinel node  . Mastectomy modified radical  06/07/2012    Procedure: MASTECTOMY MODIFIED RADICAL;  Surgeon: Haywood Lasso, MD;  Location: [place];  Service: General;  Laterality: Right;  . Tubal ligation    . Simple mastectomy with axillary sentinel node biopsy Left 09/30/2012    Procedure: SIMPLE MASTECTOMY;  Surgeon: Haywood Lasso, MD;  Location: [place];  Service: General;  Laterality: Left;  left total mastectomy  . Latissimus flap to breast Right 06/06/2013    Procedure: RIGHT LATISSIMUS MYOCUTANEOUS FLAP WITH PLACEMENT OF TISSUE EXPANDER IN RIGHT BREAST ;  Surgeon: Theodoro Kos, DO;  Location: [place];  Service: Plastics;  Laterality: Right;  . Breast reconstruction with placement of tissue expander and flex hd (acellular hydrated dermis) Left 06/06/2013    Procedure: PLACEMENT OF LEFT BREAST TISSUE EXPANDER AND FLEX HD ;  Surgeon: Theodoro Kos, DO;  Location: [place];  Service: Plastics;  Laterality: Left;    Current Outpatient Prescriptions  Medication Sig Dispense Refill  . amphetamine-dextroamphetamine (ADDERALL XR) 30 MG 24 hr capsule Take 30 mg by mouth daily.       . Biotin 10 MG TABS Take 1 tablet by mouth daily.      Marland Kitchen buPROPion (WELLBUTRIN XL)  150 MG 24 hr tablet Take 150 mg by mouth daily.       . Cholecalciferol (VITAMIN D-3) 5000 UNITS TABS Take 5,000 Units by mouth daily.      Marland Kitchen lidocaine-prilocaine (EMLA) cream Apply 45 to 60 minutes      . tamoxifen (NOLVADEX) 20 MG tablet Take 20 mg by mouth daily.      . traZODone (DESYREL) 50 MG tablet Take 150 mg by mouth at bedtime.      Marland Kitchen VIIBRYD 40 MG TABS Take 40 mg by mouth daily.       . vitamin E 100 UNIT capsule Take 100 Units by mouth daily.       No current facility-administered medications for this visit.    Family History  Problem Relation Age of Onset  . Breast cancer Mother 49  . Lymphoma Mother 70    non-hodgkins lymphoma in her epiglottis  . Cancer Mother     non-hodkins  lymphoma, breast  . Stroke Maternal Grandfather   . Bipolar disorder Brother   . Gout Brother   . Breast cancer Other     MGM's sister; dx <50  . Breast cancer Other 39    MGF's sister  . Brain cancer Other     MGM's sister; dx <50  . Leukemia Other     mother's maternal cousin; dx under 10    ROS:  Pertinent items are noted in HPI.  Otherwise, a comprehensive ROS was negative.  Exam:   BP 112/64  Pulse 64  Resp 16  Ht 5' 4.75" (1.645 m)  Wt 117 lb (53.071 kg)  BMI 19.61 kg/m2  LMP 08/29/2011   Height: 5' 4.75" (164.5 cm)  Ht Readings from Last 3 Encounters:  10/10/13 5' 4.75" (1.645 m)  10/06/13 5\' 4"  (1.626 m)  08/16/13 5\' 4"  (1.626 m)    General appearance: alert, cooperative and appears stated age Head: Normocephalic, without obvious abnormality, atraumatic Neck: no adenopathy, supple, symmetrical, trachea midline and thyroid normal to inspection and palpation Lungs: clear to auscultation bilaterally Breasts: hard tissue exapanders bilaterally.  No masses.  No LAD palpable. Heart: regular rate and rhythm Abdomen: soft, non-tender; bowel sounds normal; no masses,  no organomegaly Extremities: extremities normal, atraumatic, no cyanosis or edema Skin: Skin color, texture, turgor normal. No rashes or lesions Lymph nodes: Cervical, supraclavicular, and axillary nodes normal. No abnormal inguinal nodes palpated Neurologic: Grossly normal   Pelvic: External genitalia:  no lesions              Urethra:  normal appearing urethra with no masses, tenderness or lesions              Bartholins and Skenes: normal                 Vagina: normal appearing vagina with normal color and discharge, no lesions              Cervix: no lesions              Pap taken: yes Bimanual Exam:  Uterus:  normal size, contour, position, consistency, mobility, non-tender              Adnexa: normal adnexa and no mass, fullness, tenderness               Rectovaginal: Confirms                Anus:  normal sphincter tone, no lesions  A:  Well Woman with  normal exam PMP no HRT H/O ER+ breast cancer s/p Right mastectomy with 3+ LN/chemo/radiation 11/13 and s/p left simple mastectomy 3/14. Mother with hx of breast cancer OSA Anxiety/Depression H/O abnormal pap s/p conization 1998  P:   Mammograms not needed.   Pap with HR HPV done Due to body aches, she is having an MRI of chest and abdomen.  Will sent message to oncologist to add pelvis to study. pap smear with HR HPV today. return annually or prn  An After Visit Summary was printed and given to the patient.

## 2013-10-10 NOTE — Telephone Encounter (Signed)
LMTCB re: appointment slot work-in with Dr. Sabra Heck 10/13/13 at 3:15 PM.

## 2013-10-10 NOTE — Telephone Encounter (Signed)
Called patient and left message for a cancellation 10/10/13 at 12:45 PM with Dr. Sabra Heck. Let patient know if this doesn't work for her, she will be hearing from me again soon.

## 2013-10-11 ENCOUNTER — Other Ambulatory Visit: Payer: Self-pay

## 2013-10-11 ENCOUNTER — Emergency Department (HOSPITAL_BASED_OUTPATIENT_CLINIC_OR_DEPARTMENT_OTHER)
Admission: EM | Admit: 2013-10-11 | Discharge: 2013-10-11 | Disposition: A | Discharge status: Home / Self Care | Payer: BC Managed Care – PPO | Attending: Emergency Medicine | Admitting: Emergency Medicine

## 2013-10-11 ENCOUNTER — Emergency Department (HOSPITAL_BASED_OUTPATIENT_CLINIC_OR_DEPARTMENT_OTHER): Payer: BC Managed Care – PPO

## 2013-10-11 ENCOUNTER — Encounter (HOSPITAL_BASED_OUTPATIENT_CLINIC_OR_DEPARTMENT_OTHER): Payer: Self-pay | Admitting: Emergency Medicine

## 2013-10-11 DIAGNOSIS — R0789 Other chest pain: Secondary | ICD-10-CM

## 2013-10-11 DIAGNOSIS — S2249XA Multiple fractures of ribs, unspecified side, initial encounter for closed fracture: Secondary | ICD-10-CM | POA: Insufficient documentation

## 2013-10-11 DIAGNOSIS — X58XXXA Exposure to other specified factors, initial encounter: Secondary | ICD-10-CM | POA: Insufficient documentation

## 2013-10-11 DIAGNOSIS — Y929 Unspecified place or not applicable: Secondary | ICD-10-CM | POA: Insufficient documentation

## 2013-10-11 DIAGNOSIS — Y939 Activity, unspecified: Secondary | ICD-10-CM | POA: Insufficient documentation

## 2013-10-11 DIAGNOSIS — F329 Major depressive disorder, single episode, unspecified: Secondary | ICD-10-CM | POA: Insufficient documentation

## 2013-10-11 DIAGNOSIS — F411 Generalized anxiety disorder: Secondary | ICD-10-CM | POA: Insufficient documentation

## 2013-10-11 DIAGNOSIS — Z853 Personal history of malignant neoplasm of breast: Secondary | ICD-10-CM | POA: Insufficient documentation

## 2013-10-11 DIAGNOSIS — M542 Cervicalgia: Secondary | ICD-10-CM | POA: Insufficient documentation

## 2013-10-11 DIAGNOSIS — G4730 Sleep apnea, unspecified: Secondary | ICD-10-CM | POA: Insufficient documentation

## 2013-10-11 DIAGNOSIS — Z923 Personal history of irradiation: Secondary | ICD-10-CM | POA: Insufficient documentation

## 2013-10-11 DIAGNOSIS — F3289 Other specified depressive episodes: Secondary | ICD-10-CM | POA: Insufficient documentation

## 2013-10-11 DIAGNOSIS — S2239XA Fracture of one rib, unspecified side, initial encounter for closed fracture: Secondary | ICD-10-CM

## 2013-10-11 DIAGNOSIS — Z79899 Other long term (current) drug therapy: Secondary | ICD-10-CM | POA: Insufficient documentation

## 2013-10-11 LAB — CBC WITH DIFFERENTIAL/PLATELET
Basophils Absolute: 0 10*3/uL (ref 0.0–0.1)
Basophils Relative: 0 % (ref 0–1)
Eosinophils Absolute: 0.2 10*3/uL (ref 0.0–0.7)
Eosinophils Relative: 3 % (ref 0–5)
HCT: 40.4 % (ref 36.0–46.0)
Hemoglobin: 13.7 g/dL (ref 12.0–15.0)
Lymphocytes Relative: 21 % (ref 12–46)
Lymphs Abs: 1.2 10*3/uL (ref 0.7–4.0)
MCH: 30.8 pg (ref 26.0–34.0)
MCHC: 33.9 g/dL (ref 30.0–36.0)
MCV: 90.8 fL (ref 78.0–100.0)
Monocytes Absolute: 0.4 10*3/uL (ref 0.1–1.0)
Monocytes Relative: 8 % (ref 3–12)
Neutro Abs: 3.9 10*3/uL (ref 1.7–7.7)
Neutrophils Relative %: 68 % (ref 43–77)
Platelets: 222 10*3/uL (ref 150–400)
RBC: 4.45 MIL/uL (ref 3.87–5.11)
RDW: 14 % (ref 11.5–15.5)
WBC: 5.7 10*3/uL (ref 4.0–10.5)

## 2013-10-11 LAB — COMPREHENSIVE METABOLIC PANEL
ALT: 22 U/L (ref 0–35)
AST: 27 U/L (ref 0–37)
Albumin: 3.7 g/dL (ref 3.5–5.2)
Alkaline Phosphatase: 92 U/L (ref 39–117)
BUN: 17 mg/dL (ref 6–23)
CO2: 31 mEq/L (ref 19–32)
Calcium: 9.7 mg/dL (ref 8.4–10.5)
Chloride: 100 mEq/L (ref 96–112)
Creatinine, Ser: 0.9 mg/dL (ref 0.50–1.10)
GFR calc Af Amer: 84 mL/min — ABNORMAL LOW (ref >90)
GFR calc non Af Amer: 72 mL/min — ABNORMAL LOW (ref >90)
Glucose, Bld: 104 mg/dL — ABNORMAL HIGH (ref 70–99)
Potassium: 4.2 mEq/L (ref 3.7–5.3)
Sodium: 143 mEq/L (ref 137–147)
Total Bilirubin: 0.3 mg/dL (ref 0.3–1.2)
Total Protein: 7.5 g/dL (ref 6.0–8.3)

## 2013-10-11 LAB — URINALYSIS, ROUTINE W REFLEX MICROSCOPIC
Bilirubin Urine: NEGATIVE
Glucose, UA: NEGATIVE mg/dL
Hgb urine dipstick: NEGATIVE
Ketones, ur: NEGATIVE mg/dL
Leukocytes, UA: NEGATIVE
Nitrite: NEGATIVE
Protein, ur: NEGATIVE mg/dL
Specific Gravity, Urine: 1.014 (ref 1.005–1.030)
Urobilinogen, UA: 0.2 mg/dL (ref 0.0–1.0)
pH: 7.5 (ref 5.0–8.0)

## 2013-10-11 LAB — TROPONIN I: Troponin I: <0.3 ng/mL (ref <0.30)

## 2013-10-11 MED ORDER — ONDANSETRON HCL 4 MG/2ML IJ SOLN
4.0000 mg | Freq: Once | INTRAMUSCULAR | Status: AC
  Administered 2013-10-11: 4 mg via INTRAMUSCULAR
  Filled 2013-10-11: qty 2

## 2013-10-11 MED ORDER — HYDROMORPHONE HCL PF 1 MG/ML IJ SOLN
0.5000 mg | Freq: Once | INTRAMUSCULAR | Status: AC
  Administered 2013-10-11: 0.5 mg via INTRAVENOUS
  Filled 2013-10-11: qty 1

## 2013-10-11 MED ORDER — IOHEXOL 350 MG/ML SOLN
80.0000 mL | Freq: Once | INTRAVENOUS | Status: AC | PRN
  Administered 2013-10-11: 80 mL via INTRAVENOUS

## 2013-10-11 MED ORDER — OXYCODONE-ACETAMINOPHEN 5-325 MG PO TABS: 2.0000 | ORAL_TABLET | ORAL | Status: DC | PRN

## 2013-10-11 NOTE — Discharge Instructions (Signed)
Chest Wall Pain Chest wall pain is pain in or around the bones and muscles of your chest. It may take up to 6 weeks to get better. It may take longer if you must stay physically active in your work and activities.  CAUSES  Chest wall pain may happen on its own. However, it may be caused by:  A viral illness like the flu.  Injury.  Coughing.  Exercise.  Arthritis.  Fibromyalgia.  Shingles. HOME CARE INSTRUCTIONS   Avoid overtiring physical activity. Try not to strain or perform activities that cause pain. This includes any activities using your chest or your abdominal and side muscles, especially if heavy weights are used.  Put ice on the sore area.  Put ice in a plastic bag.  Place a towel between your skin and the bag.  Leave the ice on for 15-20 minutes per hour while awake for the first 2 days.  Only take over-the-counter or prescription medicines for pain, discomfort, or fever as directed by your caregiver. SEEK IMMEDIATE MEDICAL CARE IF:   Your pain increases, or you are very uncomfortable.  You have a fever.  Your chest pain becomes worse.  You have new, unexplained symptoms.  You have nausea or vomiting.  You feel sweaty or lightheaded.  You have a cough with phlegm (sputum), or you cough up blood. MAKE SURE YOU:   Understand these instructions.  Will watch your condition.  Will get help right away if you are not doing well or get worse. Document Released: 07/14/2005 Document Revised: 10/06/2011 Document Reviewed: 03/10/2011 [facility] Patient Information 2014 [place], [place]. Rib Fracture A rib fracture is a break or crack in one of the bones of the ribs. The ribs are a group of long, curved bones that wrap around your chest and attach to your spine. They protect your lungs and other organs in the chest cavity. A broken or cracked rib is often painful, but most do not cause other problems. Most rib fractures heal on their own over time. However, rib  fractures can be more serious if multiple ribs are broken or if broken ribs move out of place and push against other structures. CAUSES   A direct blow to the chest. For example, this could happen during contact sports, a car accident, or a fall against a hard object.  Repetitive movements with high force, such as pitching a baseball or having severe coughing spells. SYMPTOMS   Pain when you breathe in or cough.  Pain when someone presses on the injured area. DIAGNOSIS  Your caregiver will perform a physical exam. Various imaging tests may be ordered to confirm the diagnosis and to look for related injuries. These tests may include a chest X-ray, computed tomography (CT), magnetic resonance imaging (MRI), or a bone scan. TREATMENT  Rib fractures usually heal on their own in 1 3 months. The longer healing period is often associated with a continued cough or other aggravating activities. During the healing period, pain control is very important. Medication is usually given to control pain. Hospitalization or surgery may be needed for more severe injuries, such as those in which multiple ribs are broken or the ribs have moved out of place.  HOME CARE INSTRUCTIONS   Avoid strenuous activity and any activities or movements that cause pain. Be careful during activities and avoid bumping the injured rib.  Gradually increase activity as directed by your caregiver.  Only take over-the-counter or prescription medications as directed by your caregiver. Do not take  other medications without asking your caregiver first.  Apply ice to the injured area for the first 1 2 days after you have been treated or as directed by your caregiver. Applying ice helps to reduce inflammation and pain.  Put ice in a plastic bag.  Place a towel between your skin and the bag.   Leave the ice on for 15 20 minutes at a time, every 2 hours while you are awake.  Perform deep breathing as directed by your caregiver. This  will help prevent pneumonia, which is a common complication of a broken rib. Your caregiver may instruct you to:  Take deep breaths several times a day.  Try to cough several times a day, holding a pillow against the injured area.  Use a device called an incentive spirometer to practice deep breathing several times a day.  Drink enough fluids to keep your urine clear or pale yellow. This will help you avoid constipation.   Do not wear a rib belt or binder. These restrict breathing, which can lead to pneumonia.  SEEK IMMEDIATE MEDICAL CARE IF:   You have a fever.   You have difficulty breathing or shortness of breath.   You develop a continual cough, or you cough up thick or bloody sputum.  You feel sick to your stomach (nausea), throw up (vomit), or have abdominal pain.   You have worsening pain not controlled with medications.  MAKE SURE YOU:  Understand these instructions.  Will watch your condition.  Will get help right away if you are not doing well or get worse. Document Released: 07/14/2005 Document Revised: 03/16/2013 Document Reviewed: 09/15/2012 [facility] Patient Information 2014 [place], [place].

## 2013-10-11 NOTE — Telephone Encounter (Signed)
Scheduled and seen.

## 2013-10-11 NOTE — ED Notes (Signed)
Stabbing pain under her left breast. Aching all over. States she is a breast cancer survivor and is in the process of breast reconstructive surgery now.

## 2013-10-11 NOTE — ED Provider Notes (Signed)
CSN: [id number]     Arrival date & time 10/11/13  2022 History   First MD Initiated Contact with Patient 10/11/13 2037     Chief Complaint  Patient presents with  . Breast Pain     (Consider location/radiation/quality/duration/timing/severity/associated sxs/prior Treatment) Patient is a 53 y.o. female presenting with chest pain. The history is provided by the patient. No language interpreter was used.  Chest Pain Pain location:  L chest Pain quality: sharp and stabbing   Pain radiates to:  Does not radiate Pain radiates to the back: no   Pain severity:  Severe Onset quality:  Gradual Timing:  Constant Progression:  Worsening Chronicity:  New Relieved by:  Nothing Worsened by:  Nothing tried Ineffective treatments:  None tried Risk factors: no hypertension   Pt has a history of breast cancer.  Current breast reconstruction.  Pt is having stabiing pain left chest.  Pt also has pain in her neck.    Past Medical History  Diagnosis Date  . Anxiety   . Depression   . Breast cancer 05/05/12    right, ER +, PR -, Her 2 -  . Arthritis   . History of radiation therapy 07/14/2012-09/03/12    right chest wall 59.4 gray  . Sleep apnea     cpap  . H/O dizziness   . Abnormal Pap smear of cervix     with conization   Past Surgical History  Procedure Laterality Date  . Cesarean section      x3  . Joint replacement      Rt and Lt thumbs  . Cervical conization w/bx    . Breast surgery      several biopsies, needle aspiration  . Mastectomy w/ sentinel node biopsy  06/07/2012    Procedure: MASTECTOMY WITH SENTINEL LYMPH NODE BIOPSY;  Surgeon: Haywood Lasso, MD;  Location: [place];  Service: General;  Laterality: Right;  right total mastectomy and sentinel node  . Mastectomy modified radical  06/07/2012    Procedure: MASTECTOMY MODIFIED RADICAL;  Surgeon: Haywood Lasso, MD;  Location: [place];  Service: General;  Laterality: Right;  . Tubal ligation    . Simple mastectomy with  axillary sentinel node biopsy Left 09/30/2012    Procedure: SIMPLE MASTECTOMY;  Surgeon: Haywood Lasso, MD;  Location: [place];  Service: General;  Laterality: Left;  left total mastectomy  . Latissimus flap to breast Right 06/06/2013    Procedure: RIGHT LATISSIMUS MYOCUTANEOUS FLAP WITH PLACEMENT OF TISSUE EXPANDER IN RIGHT BREAST ;  Surgeon: Theodoro Kos, DO;  Location: [place];  Service: Plastics;  Laterality: Right;  . Breast reconstruction with placement of tissue expander and flex hd (acellular hydrated dermis) Left 06/06/2013    Procedure: PLACEMENT OF LEFT BREAST TISSUE EXPANDER AND FLEX HD ;  Surgeon: Theodoro Kos, DO;  Location: [place];  Service: Plastics;  Laterality: Left;   Family History  Problem Relation Age of Onset  . Breast cancer Mother 84  . Lymphoma Mother 66    non-hodgkins lymphoma in her epiglottis  . Cancer Mother     non-hodkins lymphoma, breast  . Stroke Maternal Grandfather   . Bipolar disorder Brother   . Gout Brother   . Breast cancer Other     MGM's sister; dx <50  . Breast cancer Other 10    MGF's sister  . Brain cancer Other     MGM's sister; dx <50  . Leukemia Other     mother's  maternal cousin; dx under 10   History  Substance Use Topics  . Smoking status: Never Smoker   . Smokeless tobacco: Never Used  . Alcohol Use: 0.5 oz/week    1 drink(s) per week     Comment: socially   OB History   Grav Para Term Preterm Abortions TAB SAB Ect Mult Living   6 3        3      Review of Systems  Cardiovascular: Positive for chest pain.  All other systems reviewed and are negative.      Allergies  Doxycycline  Home Medications   Current Outpatient Rx  Name  Route  Sig  Dispense  Refill  . amphetamine-dextroamphetamine (ADDERALL XR) 30 MG 24 hr capsule   Oral   Take 30 mg by mouth daily.          . Biotin 10 MG TABS   Oral   Take 1 tablet by mouth daily.         Marland Kitchen buPROPion (WELLBUTRIN XL) 150 MG 24 hr tablet    Oral   Take 150 mg by mouth daily.          . Cholecalciferol (VITAMIN D-3) 5000 UNITS TABS   Oral   Take 5,000 Units by mouth daily.         Marland Kitchen lidocaine-prilocaine (EMLA) cream      Apply 45 to 60 minutes         . tamoxifen (NOLVADEX) 20 MG tablet   Oral   Take 20 mg by mouth daily.         . traZODone (DESYREL) 50 MG tablet   Oral   Take 150 mg by mouth at bedtime.         Marland Kitchen VIIBRYD 40 MG TABS   Oral   Take 40 mg by mouth daily.          . vitamin E 100 UNIT capsule   Oral   Take 100 Units by mouth daily.          BP 104/64  Pulse 78  Temp(Src) 98.2 F (36.8 C) (Oral)  Resp 16  Ht 5\' 4"  (1.626 m)  Wt 117 lb (53.071 kg)  BMI 20.07 kg/m2  SpO2 98%  LMP 08/29/2011 Physical Exam  Nursing note and vitals reviewed. Constitutional: She appears well-developed and well-nourished.  HENT:  Head: Normocephalic.  Eyes: Pupils are equal, round, and reactive to light.  Neck:  decresed range of motion    Cardiovascular: Normal rate and normal heart sounds.   Pulmonary/Chest: Effort normal.  No inflammation or redness of flap, skin   Abdominal: Soft.  Musculoskeletal: Normal range of motion.  Neurological: She is alert. She has normal reflexes.  Skin: Skin is warm.  Psychiatric: She has a normal mood and affect.    ED Course  Procedures (including critical care time) Labs Review Labs Reviewed - No data to display Imaging Review No results found.   EKG Interpretation None    EKG  Normal sinus  Normal ekg.   Ct scan shows right rib fractures.   Troponin negative.   I doubt cardiac etiology.  Pt has right rib fracture .   Pt counseled on results I advised follow up with Dr. Migdalia Dk for recheck,   Pt given rx for percocet.     MDM   Final diagnoses:  Fracture, ribs  Chest wall pain        Fransico Meadow, PA-C 10/11/13 2303

## 2013-10-12 ENCOUNTER — Ambulatory Visit: Payer: Self-pay | Admitting: Gynecology

## 2013-10-12 NOTE — ED Provider Notes (Signed)
Medical screening examination/treatment/procedure(s) were performed by non-physician practitioner and as supervising physician I was immediately available for consultation/collaboration.   EKG Interpretation None        Blanchie Dessert, MD 10/12/13 1431

## 2013-10-13 ENCOUNTER — Other Ambulatory Visit: Payer: Self-pay

## 2013-10-13 ENCOUNTER — Telehealth: Payer: Self-pay

## 2013-10-13 ENCOUNTER — Other Ambulatory Visit: Payer: Self-pay | Admitting: Hematology and Oncology

## 2013-10-13 NOTE — Telephone Encounter (Signed)
Called patient to verify appt for bone scan on 3/25, and informed her Dr.Faidas would like to do a follow up after the bone scan, informed her scheduling to be calling her with appointment times available. Patient verbalized understanding, denies any questions or concerns at this time. Informed patient to call back with any questions.

## 2013-10-14 LAB — IPS PAP TEST WITH HPV

## 2013-10-17 ENCOUNTER — Other Ambulatory Visit: Payer: Self-pay | Admitting: Hematology and Oncology

## 2013-10-17 ENCOUNTER — Telehealth: Payer: Self-pay | Admitting: Oncology

## 2013-10-17 ENCOUNTER — Other Ambulatory Visit: Payer: Self-pay

## 2013-10-17 ENCOUNTER — Encounter: Payer: Self-pay | Admitting: Hematology and Oncology

## 2013-10-17 NOTE — Progress Notes (Signed)
. Kelly [name]   DOB: Nov 06, 1960  MR#: [id number]  CSN#:[id number]  PCP:  Darcus Austin GYN: Lyman Speller, SU: Neldon Mc OTHER MD: Gery Pray, Gypsy Balsam, Lyndee Leo Sanger  Diagnosis: Right breast cancer  Chief complaint: Neck pain   HISTORY OF PRESENT ILLNESS: As documented : The patient herself noted a change in her right breast. She brought it to her primary care physician, Dr. Carron Brazen attention, and diagnostic mammography was obtained at [facility] 05/05/2012. The breasts were found to be heterogeneously dense. There was a spiculated mass posteriorly and laterally to the nipple line in the right breast, and a second mass more medially. There was an asymmetric soft tissue density in the upper outer quadrant of the left breast, without any discrete mass identified. There was a cluster of microcalcifications medially in the left breast. Ultrasound of the right breast showed a lobulated mass measuring 2.3 cm and a second mass measuring 4.5 cm. There was also a third mass measuring 1.9 cm which was felt possibly to represent a lymph node. In the right axilla there was a suspicious lymph node measuring 1.1 cm.  Biopsy of this 3 breast masses was obtained the same day, and showed all 3 to represent invasive ductal carcinoma, grade 1. Because the 3 lesions were morphologically similar, only one prognostic panel was obtained (from the mass at "7:00"), showing it to be estrogen receptor 100% positive, progesterone receptor negative, with an MIB-1 of 12%, and no HER-2 amplification ([id number]).  Bilateral breast MRI was obtained 05/10/2012, and confirmed the 3 right breast masses in question, measuring 2.1, 2.8 (at the 7:00 position) and 2.2 cm. In addition a 1.3 cm right axillary lymph node was noted. There were no suspicious findings in the left breast. On October 30 fine-needle aspiration of the suspicious right axilla lymph node was performed, with the results being  negative ([id number]).   Finally on 06/07/2012 the patient underwent right modified radical mastectomy. The three masses in the right breast measured 2.2 cm, 3 cm, and 2.3 cm ([id number]). The 2.3 cm mass was morphologically slightly different (grade 2) and a prognostic profile was obtained from this mass ("superior mid-medial"). It was 100% estrogen and 98% progesterone receptor positive. MIB-1 was 16%. There was no HER-2 amplification. A total of 19 lymph nodes were sampled, of which 9 were positive. Margins were negative, but close (1 mm). The patient's subsequent history is as detailed below.  INTERVAL HISTORY: [name] is here for followup visit today. 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   She says she is in the process of having reconstructive surgery for that she sees the plastic surgery once every 3 weeks. She is tolerating tamoxifen well with minimal hot flashes.   REVIEW OF SYSTEMS: A 14 review systems is been assessed and the pertinent findings are listed in interval history    PAST MEDICAL HISTORY: Past Medical History  Diagnosis Date  . Anxiety   . Depression   . Breast cancer 05/05/12    right, ER +, PR -, Her 2 -  . Arthritis   . History of radiation therapy 07/14/2012-09/03/12    right chest wall 59.4 gray  . Sleep apnea  cpap  . H/O dizziness   . Abnormal Pap smear of cervix     with conization   sleep apnea; migraines;  PAST SURGICAL HISTORY: Past Surgical History  Procedure Laterality Date  . Cesarean section      x3  . Joint replacement      Rt and Lt thumbs  . Cervical conization w/bx    . Breast surgery      several biopsies, needle aspiration   . Mastectomy w/ sentinel node biopsy  06/07/2012    Procedure: MASTECTOMY WITH SENTINEL LYMPH NODE BIOPSY;  Surgeon: Haywood Lasso, MD;  Location: [place];  Service: General;  Laterality: Right;  right total mastectomy and sentinel node  . Mastectomy modified radical  06/07/2012    Procedure: MASTECTOMY MODIFIED RADICAL;  Surgeon: Haywood Lasso, MD;  Location: [place];  Service: General;  Laterality: Right;  . Tubal ligation    . Simple mastectomy with axillary sentinel node biopsy Left 09/30/2012    Procedure: SIMPLE MASTECTOMY;  Surgeon: Haywood Lasso, MD;  Location: [place];  Service: General;  Laterality: Left;  left total mastectomy  . Latissimus flap to breast Right 06/06/2013    Procedure: RIGHT LATISSIMUS MYOCUTANEOUS FLAP WITH PLACEMENT OF TISSUE EXPANDER IN RIGHT BREAST ;  Surgeon: Theodoro Kos, DO;  Location: [place];  Service: Plastics;  Laterality: Right;  . Breast reconstruction with placement of tissue expander and flex hd (acellular hydrated dermis) Left 06/06/2013    Procedure: PLACEMENT OF LEFT BREAST TISSUE EXPANDER AND FLEX HD ;  Surgeon: Theodoro Kos, DO;  Location: [place];  Service: Plastics;  Laterality: Left;    FAMILY HISTORY Family History  Problem Relation Age of Onset  . Breast cancer Mother 45  . Lymphoma Mother 80    non-hodgkins lymphoma in her epiglottis  . Cancer Mother     non-hodkins lymphoma, breast  . Stroke Maternal Grandfather   . Bipolar disorder Brother   . Gout Brother   . Breast cancer Other     MGM's sister; dx <50  . Breast cancer Other 80    MGF's sister  . Brain cancer Other     MGM's sister; dx <50  . Leukemia Other     mother's maternal cousin; dx under 62  the patient's mother, Kelly [name], is also my patient and has a history of breast cancer. The patient's father was adopted. Kelly [name] has 2 brothers, no sisters. There is no history of ovarian cancer in the family.  GYNECOLOGIC HISTORY: She had menarche  age 29, first live birth age 6. She is GX P3. Her periods are regular.  SOCIAL HISTORY: Orianna volunteers at the [employer], at church [facility]) and for other causes. Her husband Elta Guadeloupe                         . Their children are currently 43 years old (doing the [place] trail), 78 (freshman at [employer]), and 11.   ADVANCED DIRECTIVES: Not in place  HEALTH MAINTENANCE: History  Substance Use Topics  . Smoking status: Never Smoker   . Smokeless tobacco: Never Used  . Alcohol Use: 0.5 oz/week    1 drink(s) per week     Comment: socially    Colonoscopy:  PAP:  Bone density: 08/11/2012 at [facility], osteopenia  Lipid panel: August 2013, Dr. Inis Sizer  Allergies  Allergen Reactions  . Doxycycline Rash    Current Outpatient Prescriptions  Medication  Sig Dispense Refill  . amphetamine-dextroamphetamine (ADDERALL XR) 30 MG 24 hr capsule Take 30 mg by mouth daily.       . Biotin 10 MG TABS Take 1 tablet by mouth daily.      Marland Kitchen buPROPion (WELLBUTRIN XL) 150 MG 24 hr tablet Take 150 mg by mouth daily.       . Cholecalciferol (VITAMIN D-3) 5000 UNITS TABS Take 5,000 Units by mouth daily.      . tamoxifen (NOLVADEX) 20 MG tablet Take 20 mg by mouth daily.      . traZODone (DESYREL) 50 MG tablet Take 150 mg by mouth at bedtime.      Marland Kitchen VIIBRYD 40 MG TABS Take 40 mg by mouth daily.       . vitamin E 100 UNIT capsule Take 100 Units by mouth daily.      Marland Kitchen lidocaine-prilocaine (EMLA) cream Apply 45 to 60 minutes      . oxyCODONE-acetaminophen (PERCOCET/ROXICET) 5-325 MG per tablet Take 2 tablets by mouth every 4 (four) hours as needed for severe pain.  30 tablet  0   No current facility-administered medications for this visit.    OBJECTIVE: Middle-aged white woman in no acute distress Filed Vitals:   10/06/13 1425  BP: 111/74  Pulse: 93  Temp: 97.2 F (36.2 C)  Resp: 18     Body mass index is 20.07 kg/(m^2).    ECOG FS: 0 Filed Weights   10/06/13 1425   Weight: 117 lb (53.071 kg)    Sclerae unicteric Oropharynx clear  Lymph node examination: lymph node approximately 0.4cm centimeter in size, freely mobile, nontender in the left anterior cervical area.  Lungs clear to auscultation bilaterally, no rales or rhonchi Heart regular rate and rhythm, no murmur Abd soft, nontender, positive bowel sounds MSK no focal spinal tenderness, no peripheral edema Neuro: nonfocal, well oriented, friendly affect Breasts: Bilateral breast mastectomy scars  Neck examination: Patient has tenderness in the cervical-occipital area and supraclavicular areas Skin examination:    LAB RESULTS: Lab Results  Component Value Date   WBC 5.7 10/11/2013   NEUTROABS 3.9 10/11/2013   HGB 13.7 10/11/2013   HCT 40.4 10/11/2013   MCV 90.8 10/11/2013   PLT 222 10/11/2013      Chemistry      Component Value Date/Time   NA 143 10/11/2013 2125   NA 143 09/29/2013 1519   K 4.2 10/11/2013 2125   K 3.9 09/29/2013 1519   CL 100 10/11/2013 2125   CL 102 12/27/2012 0935   CO2 31 10/11/2013 2125   CO2 31* 09/29/2013 1519   BUN 17 10/11/2013 2125   BUN 17.5 09/29/2013 1519   CREATININE 0.90 10/11/2013 2125   CREATININE 0.8 09/29/2013 1519      Component Value Date/Time   CALCIUM 9.7 10/11/2013 2125   CALCIUM 9.3 09/29/2013 1519   ALKPHOS 92 10/11/2013 2125   ALKPHOS 86 09/29/2013 1519   AST 27 10/11/2013 2125   AST 22 09/29/2013 1519   ALT 22 10/11/2013 2125   ALT 18 09/29/2013 1519   BILITOT 0.3 10/11/2013 2125   BILITOT 0.41 09/29/2013 1519       Lab Results  Component Value Date   LABCA2 24 09/29/2013   CLINICAL DATA: 53 year old female with breast cancer diagnosed  2013. Neck pain, lymphadenopathy. Initial encounter.  EXAM:  CT NECK WITH CONTRAST  TECHNIQUE:  Multidetector CT imaging of the neck was performed using the  standard protocol following the bolus administration of  intravenous  contrast.  CONTRAST: 173m OMNIPAQUE IOHEXOL 300 MG/ML SOLN  COMPARISON: PET-CT 12/27/2012.  Brain MRI 08/27/2012.  FINDINGS:  Negative lung apices. No superior mediastinal or internal mammary  lymphadenopathy identified. Postoperative changes to the right  axilla. No definite axillary lymphadenopathy.  Stable sclerosis and osteophytosis of the left clavicular head which  appears to be degenerative in nature. No suspicious osseous lesion  in the visible thorax.  Negative thyroid, larynx, pharynx (mild motion artifact),  parapharyngeal spaces, retropharyngeal space, sublingual space,  submandibular glands, and parotid glands. Visualized orbit soft  tissues are within normal limits. Grossly negative visualized brain  parenchyma. Major vascular structures in the neck and at the  skullbase are patent with dominant left vertebral artery again  noted.  Marker placed at the left lateral neck near the submandibular space  on series 2, image 52, overlying the left external jugular vein.  Nearby a left level 2 lymph nodes are within normal limits,  measuring up to 5 mm short axis. Likewise, level 1 and right level 2  knee nodes are within normal limits. The left IJ is patent. Mildly  asymmetric but small left level 3 nodes, up to 4 mm short axis  (series 2, image 68). No cervical or thoracic inlet lymphadenopathy  identified.  No acute or suspicious osseous lesion identified in the neck or at  the skullbase.  IMPRESSION:  1. No neck mass or suspicious cervical lymph nodes. No acute or  metastatic process identified in the neck.  2. Stable degenerative changes at the left sternoclavicular joint.  Negative visualized upper chest.  Electronically Signed  By: LLars PinksM.D.  On: 08/23/2013 17:41       Study Result    *RADIOLOGY REPORT*  Clinical Data: Subsequent treatment strategy for breast cancer.  NUCLEAR MEDICINE PET SKULL BASE TO THIGH  Fasting Blood Glucose: 76  Technique: 18.8 mCi F-18 FDG was injected intravenously. CT data  was obtained and used for attenuation  correction and anatomic  localization only. (This was not acquired as a diagnostic CT  examination.) Additional exam technical data entered on  technologist worksheet.  Comparison: None  Findings:  Neck: No hypermetabolic lymph nodes in the neck.  Chest: No hypermetabolic mediastinal or hilar nodes. No  suspicious pulmonary nodules on the CT scan. Surgical changes in  the right axilla and from bilateral mastectomies but no findings  for axillary adenopathy or recurrent chest wall disease. Low level  FDG activity near the cluster of right axillary surgical clips but  no discrete tumor and the SUV Max is 2.8.  Abdomen/Pelvis: No abnormal hypermetabolic activity within the  liver, pancreas, adrenal glands, or spleen. No hypermetabolic  lymph nodes in the abdomen or pelvis.  Skeleton: No focal hypermetabolic activity to suggest skeletal  metastasis.  IMPRESSION:  1. Surgical changes related to bilateral mastectomies.  2. Low level FDG activity near the cluster of right axillary  surgical clips but no discrete tumor and the SUV Max is 2.8.  Recommend clinical follow-up.  3. No findings to suggest metastatic disease involving the chest,  abdomen or pelvis.  Original Report Authenticated By: PMarijo Sanes M.D.            STUDIES: No results found.   ASSESSMENT: 53y.o. BRCA negative [place] woman status post right modified radical mastectomy 06/07/2012 for a multifocal invasive ductal carcinoma, the largest of 3 masses measuring 3.0 cm, grade 1; a second mass measuring 2.3 cm, grade 2; and a third mass measuring  2.2 cm, grade 1. The 2 larger masses were both 100% estrogen receptor positive, the grade 2 mass being also progesterone receptor positive at 98%, with an MIB-1 of 16% (the larger mass was estrogen receptor negative, with an Mib-1 of 12%). Both showed no HER-2 amplification. 9 of 19 axillary lymph nodes sampled were involved. In summary:  (1) right-sided mpT2 pN2a or stage  IIIA invasive ductal carcinoma, grade 1-2, estrogen receptor positive, HER-2 not amplified, with an MIB-1 of 12-16%  (2) the patient opted to forego adjuvant chemotherapy  (3) adjuvant radiation therapy completed 09/03/2012   (4) on tamoxifen as of February 2014  (5) left simple mastectomy 09/30/2012, with benign pathology; complicated by an infected seroma MRSA infection.  (6) patient in the process of having breast reconstructive surgery.   (7) neck pain with CT neck negative for metastatic disease, also worsening back pain.  8)right gluteal skin rash resolved.History of MRSA infection will check MRSA swab for colonization.  PLAN:   #1 Continue tamoxifen tolerating well  #2 follow up with Gynecology for annual pelvic exam and Pap smears while she is on tamoxifen and to evaluate for pelvic pressure/discomfort.Will check UA today  #3 Bone scan  Schedule  Tentative follow up in 6 months with CBC and CMP pending above evaluation.    Amada Kingfisher, MD Medical Oncology      10/17/2013

## 2013-10-17 NOTE — Telephone Encounter (Signed)
lvm for pt regarind to April appt....mailed pt appt sched/avs and letter

## 2013-10-18 ENCOUNTER — Encounter: Payer: Self-pay | Admitting: Obstetrics & Gynecology

## 2013-10-18 ENCOUNTER — Encounter: Payer: Self-pay | Admitting: Hematology and Oncology

## 2013-10-19 ENCOUNTER — Encounter: Payer: Self-pay | Admitting: Physician Assistant

## 2013-10-19 ENCOUNTER — Other Ambulatory Visit: Payer: Self-pay | Admitting: Hematology and Oncology

## 2013-10-19 ENCOUNTER — Encounter: Payer: Self-pay | Admitting: Hematology and Oncology

## 2013-10-19 ENCOUNTER — Ambulatory Visit (HOSPITAL_COMMUNITY)
Admission: RE | Admit: 2013-10-19 | Discharge: 2013-10-19 | Disposition: A | Discharge status: Home / Self Care | Payer: BC Managed Care – PPO | Source: Ambulatory Visit | Attending: Hematology and Oncology | Admitting: Hematology and Oncology

## 2013-10-19 ENCOUNTER — Encounter: Payer: Self-pay | Admitting: Oncology

## 2013-10-19 ENCOUNTER — Encounter: Payer: Self-pay | Admitting: *Deleted

## 2013-10-19 ENCOUNTER — Encounter (HOSPITAL_COMMUNITY)
Admission: RE | Admit: 2013-10-19 | Discharge: 2013-10-19 | Disposition: A | Discharge status: Home / Self Care | Payer: BC Managed Care – PPO | Source: Ambulatory Visit | Attending: Hematology and Oncology | Admitting: Hematology and Oncology

## 2013-10-19 DIAGNOSIS — M549 Dorsalgia, unspecified: Secondary | ICD-10-CM | POA: Insufficient documentation

## 2013-10-19 DIAGNOSIS — R102 Pelvic and perineal pain: Secondary | ICD-10-CM

## 2013-10-19 DIAGNOSIS — C50911 Malignant neoplasm of unspecified site of right female breast: Secondary | ICD-10-CM

## 2013-10-19 DIAGNOSIS — S2249XA Multiple fractures of ribs, unspecified side, initial encounter for closed fracture: Secondary | ICD-10-CM | POA: Insufficient documentation

## 2013-10-19 DIAGNOSIS — M542 Cervicalgia: Secondary | ICD-10-CM | POA: Insufficient documentation

## 2013-10-19 DIAGNOSIS — X58XXXA Exposure to other specified factors, initial encounter: Secondary | ICD-10-CM | POA: Insufficient documentation

## 2013-10-19 DIAGNOSIS — N949 Unspecified condition associated with female genital organs and menstrual cycle: Secondary | ICD-10-CM | POA: Insufficient documentation

## 2013-10-19 DIAGNOSIS — C50919 Malignant neoplasm of unspecified site of unspecified female breast: Secondary | ICD-10-CM | POA: Insufficient documentation

## 2013-10-19 DIAGNOSIS — Z8614 Personal history of Methicillin resistant Staphylococcus aureus infection: Secondary | ICD-10-CM

## 2013-10-19 MED ORDER — TECHNETIUM TC 99M MEDRONATE IV KIT
26.6000 | PACK | Freq: Once | INTRAVENOUS | Status: AC | PRN
  Administered 2013-10-19: 26.6 via INTRAVENOUS

## 2013-10-19 NOTE — Telephone Encounter (Signed)
Called [phone].  Message left on voicemail notifying that this request has been forwarded to providers.  When results are received and reviewed she will be contacted.

## 2013-10-20 ENCOUNTER — Telehealth: Payer: Self-pay | Admitting: *Deleted

## 2013-10-20 ENCOUNTER — Other Ambulatory Visit: Payer: Self-pay | Admitting: Oncology

## 2013-10-20 ENCOUNTER — Other Ambulatory Visit: Payer: Self-pay | Admitting: Hematology and Oncology

## 2013-10-20 NOTE — Telephone Encounter (Signed)
Pt called to this RN stating appreciation regarding contact with result of bone scan " but I would like a referral to a doctor for this pain in my shoulders and back "  " I am also having a new pain in my right breast now on the top "  Per discussion pt states above is on the top of right breast and goes to the arm pit.  Per further questioning area on right breast is tender to touch, seems swollen to pt.  Pain is described as " constant and pulsating " and increases with touching, moving, coughing " and even talking ".  Kelly [name] has bilateral expanders in place with last manipulation March 5th. She has contacted Dr Leafy Ro office and is scheduled for visit with PA in AM tomorrow ( 10/21/2013 ).  Per her concern with back and shoulder pain- Kelly [name] is inquiring " could it be neurological? " " my hands have shaked for years and my son notices that my large toes twitches " Kelly [name] is no looking for pain medication control " I do not do well with medications but want to find out why I am having pain and get things taken care of ".  Return call number given as  [phone].  This note will be reviewed with MD and call returned to patient.

## 2013-10-21 ENCOUNTER — Telehealth: Payer: Self-pay | Admitting: *Deleted

## 2013-10-21 NOTE — Telephone Encounter (Signed)
LATE ENTRY :  Per MD review of pt's concerns 3/26 this RN called pt and obtained identified VM ON 10/20/2013.  Per Dr Jannifer Rodney- recommendation for Right breast discomfort is to follow up with Dr Migdalia Dk as scheduled on 10/21/2013. Pt can take tylenol/ibuprophen for discomfort and use cool compresses for relief.  Per concern due to ongoing back and neck pain including her concern if related to " neurological " - Dr Jannifer Rodney recommended for pt to follow up with primary MD for best recommendation and appropriate referral. Pain is not related to known history of breast cancer and work up and referrals need to be generated through primary MD.  Above left as detailed message with this RN's name and return call number given if needed for additional communication.

## 2013-11-04 ENCOUNTER — Other Ambulatory Visit: Payer: Self-pay | Admitting: Plastic Surgery

## 2013-11-04 DIAGNOSIS — Z9013 Acquired absence of bilateral breasts and nipples: Secondary | ICD-10-CM

## 2013-11-04 NOTE — H&P (Signed)
ABAGALE BOULOS is an 53 y.o. female.   Chief Complaint: acquired absence of bilateral breasts HPI: The patient is a 53 yrs old wf here for history and physical for bilateral breast reconstruction for her secondary procedure. She has expanders in place. She had left anterior rib fractures from a fall last month. She is very anxious to have her exchange surgery done as soon as possible as she does not feel like she can tolerate the pain from the expanders much longer.  She underwent a bone scan earlier in the week which reportedly showed that her fractures are healing.  The expanders are appropriately expanded and there is no sign of infection. She is very thin over the expanders even on the right latissimus side. She states she would just like to proceed to exchange as soon as possible. Her expanders are 300/275 on the left and 280/275 on the right.   Past Medical History  Diagnosis Date  . Anxiety   . Depression   . Breast cancer 05/05/12    right, ER +, PR -, Her 2 -  . Arthritis   . History of radiation therapy 07/14/2012-09/03/12    right chest wall 59.4 gray  . Sleep apnea     cpap  . H/O dizziness   . Abnormal Pap smear of cervix     with conization    Past Surgical History  Procedure Laterality Date  . Cesarean section      x3  . Joint replacement      Rt and Lt thumbs  . Cervical conization w/bx    . Breast surgery      several biopsies, needle aspiration  . Mastectomy w/ sentinel node biopsy  06/07/2012    Procedure: MASTECTOMY WITH SENTINEL LYMPH NODE BIOPSY;  Surgeon: Haywood Lasso, MD;  Location: [place];  Service: General;  Laterality: Right;  right total mastectomy and sentinel node  . Mastectomy modified radical  06/07/2012    Procedure: MASTECTOMY MODIFIED RADICAL;  Surgeon: Haywood Lasso, MD;  Location: [place];  Service: General;  Laterality: Right;  . Tubal ligation    . Simple mastectomy with axillary sentinel node biopsy Left 09/30/2012    Procedure:  SIMPLE MASTECTOMY;  Surgeon: Haywood Lasso, MD;  Location: [place];  Service: General;  Laterality: Left;  left total mastectomy  . Latissimus flap to breast Right 06/06/2013    Procedure: RIGHT LATISSIMUS MYOCUTANEOUS FLAP WITH PLACEMENT OF TISSUE EXPANDER IN RIGHT BREAST ;  Surgeon: Theodoro Kos, DO;  Location: [place];  Service: Plastics;  Laterality: Right;  . Breast reconstruction with placement of tissue expander and flex hd (acellular hydrated dermis) Left 06/06/2013    Procedure: PLACEMENT OF LEFT BREAST TISSUE EXPANDER AND FLEX HD ;  Surgeon: Theodoro Kos, DO;  Location: [place];  Service: Plastics;  Laterality: Left;    Family History  Problem Relation Age of Onset  . Breast cancer Mother 78  . Lymphoma Mother 55    non-hodgkins lymphoma in her epiglottis  . Cancer Mother     non-hodkins lymphoma, breast  . Stroke Maternal Grandfather   . Bipolar disorder Brother   . Gout Brother   . Breast cancer Other     MGM's sister; dx <50  . Breast cancer Other 24    MGF's sister  . Brain cancer Other     MGM's sister; dx <50  . Leukemia Other     mother's maternal cousin; dx under 44  Social History:  reports that she has never smoked. She has never used smokeless tobacco. She reports that she drinks about .5 ounces of alcohol per week. She reports that she does not use illicit drugs.  Allergies:  Allergies  Allergen Reactions  . Doxycycline Rash     (Not in a hospital admission)  No results found for this or any previous visit (from the past 48 hour(s)). No results found.  Review of Systems  Constitutional: Negative.   HENT: Negative.   Eyes: Negative.   Respiratory: Negative.   Cardiovascular: Negative.   Gastrointestinal: Negative.   Genitourinary: Negative.   Musculoskeletal: Negative.   Skin: Negative.   Neurological: Negative.   Psychiatric/Behavioral: Negative.     Last menstrual period 08/29/2011. Physical Exam  Constitutional: She  appears well-developed and well-nourished.  HENT:  Head: Normocephalic and atraumatic.  Eyes: Conjunctivae and EOM are normal. Pupils are equal, round, and reactive to light.  Cardiovascular: Normal rate.   Respiratory: Effort normal.  Musculoskeletal: Normal range of motion.  Neurological: She is alert.  Skin: Skin is warm.  Psychiatric: She has a normal mood and affect. Her behavior is normal. Thought content normal.     Assessment/Plan Removal of bilateral expanders and placement of silicone implants with bilateral capsulotomies.   Emmanuella Mirante Sanger 11/04/2013, 10:44 AM

## 2013-11-07 ENCOUNTER — Ambulatory Visit: Payer: BC Managed Care – PPO | Admitting: Neurology

## 2013-11-09 ENCOUNTER — Encounter (HOSPITAL_BASED_OUTPATIENT_CLINIC_OR_DEPARTMENT_OTHER): Payer: Self-pay | Admitting: *Deleted

## 2013-11-09 NOTE — Progress Notes (Signed)
No labs needed

## 2013-11-10 ENCOUNTER — Encounter (HOSPITAL_BASED_OUTPATIENT_CLINIC_OR_DEPARTMENT_OTHER): Payer: BC Managed Care – PPO | Admitting: Anesthesiology

## 2013-11-10 ENCOUNTER — Encounter (HOSPITAL_BASED_OUTPATIENT_CLINIC_OR_DEPARTMENT_OTHER): Payer: Self-pay | Admitting: Plastic Surgery

## 2013-11-10 ENCOUNTER — Ambulatory Visit (HOSPITAL_BASED_OUTPATIENT_CLINIC_OR_DEPARTMENT_OTHER)
Admission: RE | Admit: 2013-11-10 | Discharge: 2013-11-10 | Disposition: A | Discharge status: Home / Self Care | Payer: BC Managed Care – PPO | Source: Ambulatory Visit | Attending: Plastic Surgery | Admitting: Plastic Surgery

## 2013-11-10 ENCOUNTER — Encounter (HOSPITAL_BASED_OUTPATIENT_CLINIC_OR_DEPARTMENT_OTHER)
Admission: RE | Disposition: A | Discharge status: Home / Self Care | Payer: Self-pay | Source: Ambulatory Visit | Attending: Plastic Surgery

## 2013-11-10 ENCOUNTER — Ambulatory Visit (HOSPITAL_BASED_OUTPATIENT_CLINIC_OR_DEPARTMENT_OTHER): Payer: BC Managed Care – PPO | Admitting: Anesthesiology

## 2013-11-10 DIAGNOSIS — F3289 Other specified depressive episodes: Secondary | ICD-10-CM | POA: Insufficient documentation

## 2013-11-10 DIAGNOSIS — F411 Generalized anxiety disorder: Secondary | ICD-10-CM | POA: Insufficient documentation

## 2013-11-10 DIAGNOSIS — Z853 Personal history of malignant neoplasm of breast: Secondary | ICD-10-CM | POA: Insufficient documentation

## 2013-11-10 DIAGNOSIS — Z9013 Acquired absence of bilateral breasts and nipples: Secondary | ICD-10-CM

## 2013-11-10 DIAGNOSIS — Z923 Personal history of irradiation: Secondary | ICD-10-CM | POA: Insufficient documentation

## 2013-11-10 DIAGNOSIS — F329 Major depressive disorder, single episode, unspecified: Secondary | ICD-10-CM | POA: Insufficient documentation

## 2013-11-10 DIAGNOSIS — Z9010 Acquired absence of unspecified breast and nipple: Secondary | ICD-10-CM | POA: Insufficient documentation

## 2013-11-10 DIAGNOSIS — N650 Deformity of reconstructed breast: Secondary | ICD-10-CM | POA: Insufficient documentation

## 2013-11-10 DIAGNOSIS — G4730 Sleep apnea, unspecified: Secondary | ICD-10-CM | POA: Insufficient documentation

## 2013-11-10 HISTORY — PX: REMOVAL OF BILATERAL TISSUE EXPANDERS WITH PLACEMENT OF BILATERAL BREAST IMPLANTS: SHX6431

## 2013-11-10 SURGERY — REMOVAL, TISSUE EXPANDER, BREAST, BILATERAL, WITH BILATERAL IMPLANT IMPLANT INSERTION
Anesthesia: General | Site: Breast | Laterality: Bilateral

## 2013-11-10 MED ORDER — LACTATED RINGERS IV SOLN
INTRAVENOUS | Status: DC
  Administered 2013-11-10 (×3): via INTRAVENOUS

## 2013-11-10 MED ORDER — CEFAZOLIN SODIUM-DEXTROSE 2-3 GM-% IV SOLR
INTRAVENOUS | Status: AC
  Filled 2013-11-10: qty 50

## 2013-11-10 MED ORDER — ONDANSETRON HCL 4 MG/2ML IJ SOLN
INTRAMUSCULAR | Status: DC | PRN
  Administered 2013-11-10: 4 mg via INTRAVENOUS

## 2013-11-10 MED ORDER — PROPOFOL 10 MG/ML IV BOLUS
INTRAVENOUS | Status: DC | PRN
  Administered 2013-11-10: 150 mg via INTRAVENOUS

## 2013-11-10 MED ORDER — HYDROMORPHONE HCL PF 1 MG/ML IJ SOLN
INTRAMUSCULAR | Status: AC
  Filled 2013-11-10: qty 1

## 2013-11-10 MED ORDER — FENTANYL CITRATE 0.05 MG/ML IJ SOLN: 50.0000 ug | INTRAMUSCULAR | Status: DC | PRN

## 2013-11-10 MED ORDER — PROMETHAZINE HCL 25 MG/ML IJ SOLN
6.2500 mg | Freq: Once | INTRAMUSCULAR | Status: AC | PRN
  Administered 2013-11-10: 6.25 mg via INTRAVENOUS

## 2013-11-10 MED ORDER — CEFAZOLIN SODIUM-DEXTROSE 2-3 GM-% IV SOLR
2.0000 g | INTRAVENOUS | Status: AC
  Administered 2013-11-10: 2 g via INTRAVENOUS

## 2013-11-10 MED ORDER — FENTANYL CITRATE 0.05 MG/ML IJ SOLN
INTRAMUSCULAR | Status: DC | PRN
  Administered 2013-11-10: 100 ug via INTRAVENOUS

## 2013-11-10 MED ORDER — PROMETHAZINE HCL 25 MG/ML IJ SOLN
INTRAMUSCULAR | Status: AC
  Filled 2013-11-10: qty 1

## 2013-11-10 MED ORDER — SODIUM CHLORIDE 0.9 % IR SOLN
Status: DC | PRN
  Administered 2013-11-10: 13:00:00

## 2013-11-10 MED ORDER — DEXAMETHASONE SODIUM PHOSPHATE 4 MG/ML IJ SOLN
INTRAMUSCULAR | Status: DC | PRN
  Administered 2013-11-10: 10 mg via INTRAVENOUS

## 2013-11-10 MED ORDER — LIDOCAINE HCL (CARDIAC) 20 MG/ML IV SOLN
INTRAVENOUS | Status: DC | PRN
  Administered 2013-11-10: 50 mg via INTRAVENOUS

## 2013-11-10 MED ORDER — HYDROMORPHONE HCL PF 1 MG/ML IJ SOLN
0.2500 mg | INTRAMUSCULAR | Status: DC | PRN
  Administered 2013-11-10: 0.5 mg via INTRAVENOUS
  Administered 2013-11-10 (×2): 0.25 mg via INTRAVENOUS

## 2013-11-10 MED ORDER — EPHEDRINE SULFATE 50 MG/ML IJ SOLN
INTRAMUSCULAR | Status: DC | PRN
  Administered 2013-11-10: 5 mg via INTRAVENOUS
  Administered 2013-11-10 (×2): 10 mg via INTRAVENOUS
  Administered 2013-11-10 (×4): 5 mg via INTRAVENOUS

## 2013-11-10 MED ORDER — OXYCODONE HCL 5 MG PO TABS
ORAL_TABLET | ORAL | Status: AC
  Filled 2013-11-10: qty 1

## 2013-11-10 MED ORDER — FENTANYL CITRATE 0.05 MG/ML IJ SOLN
INTRAMUSCULAR | Status: AC
  Filled 2013-11-10: qty 4

## 2013-11-10 MED ORDER — OXYCODONE HCL 5 MG/5ML PO SOLN: 5.0000 mg | Freq: Once | ORAL | Status: AC | PRN

## 2013-11-10 MED ORDER — FENTANYL CITRATE 0.05 MG/ML IJ SOLN
INTRAMUSCULAR | Status: AC
  Filled 2013-11-10: qty 2

## 2013-11-10 MED ORDER — MIDAZOLAM HCL 5 MG/5ML IJ SOLN
INTRAMUSCULAR | Status: DC | PRN
  Administered 2013-11-10: 2 mg via INTRAVENOUS

## 2013-11-10 MED ORDER — BUPIVACAINE-EPINEPHRINE 0.25% -1:200000 IJ SOLN
INTRAMUSCULAR | Status: DC | PRN
  Administered 2013-11-10: 3.5 mL

## 2013-11-10 MED ORDER — MIDAZOLAM HCL 2 MG/2ML IJ SOLN
INTRAMUSCULAR | Status: AC
  Filled 2013-11-10: qty 2

## 2013-11-10 MED ORDER — MIDAZOLAM HCL 2 MG/2ML IJ SOLN: 1.0000 mg | INTRAMUSCULAR | Status: DC | PRN

## 2013-11-10 MED ORDER — OXYCODONE HCL 5 MG PO TABS
5.0000 mg | ORAL_TABLET | Freq: Once | ORAL | Status: AC | PRN
  Administered 2013-11-10: 5 mg via ORAL

## 2013-11-10 SURGICAL SUPPLY — 76 items
BAG DECANTER FOR FLEXI CONT (MISCELLANEOUS) ×2 IMPLANT
BINDER BREAST LRG (GAUZE/BANDAGES/DRESSINGS) IMPLANT
BINDER BREAST MEDIUM (GAUZE/BANDAGES/DRESSINGS) ×2 IMPLANT
BINDER BREAST XLRG (GAUZE/BANDAGES/DRESSINGS) IMPLANT
BINDER BREAST XXLRG (GAUZE/BANDAGES/DRESSINGS) IMPLANT
BIOPATCH RED 1 DISK 7.0 (GAUZE/BANDAGES/DRESSINGS) IMPLANT
BLADE HEX COATED 2.75 (ELECTRODE) ×2 IMPLANT
BLADE SURG 15 STRL LF DISP TIS (BLADE) ×2 IMPLANT
BLADE SURG 15 STRL SS (BLADE) ×2
BNDG GAUZE ELAST 4 BULKY (GAUZE/BANDAGES/DRESSINGS) ×4 IMPLANT
CANISTER SUCT 1200ML W/VALVE (MISCELLANEOUS) ×2 IMPLANT
CHLORAPREP W/TINT 26ML (MISCELLANEOUS) ×2 IMPLANT
CORDS BIPOLAR (ELECTRODE) IMPLANT
COVER MAYO STAND STRL (DRAPES) ×2 IMPLANT
COVER TABLE BACK 60X90 (DRAPES) ×2 IMPLANT
DECANTER SPIKE VIAL GLASS SM (MISCELLANEOUS) IMPLANT
DERMABOND ADVANCED (GAUZE/BANDAGES/DRESSINGS) ×2
DERMABOND ADVANCED .7 DNX12 (GAUZE/BANDAGES/DRESSINGS) ×2 IMPLANT
DRAIN CHANNEL 19F RND (DRAIN) IMPLANT
DRAPE LAPAROSCOPIC ABDOMINAL (DRAPES) ×2 IMPLANT
DRSG PAD ABDOMINAL 8X10 ST (GAUZE/BANDAGES/DRESSINGS) ×4 IMPLANT
DRSG TEGADERM 2-3/8X2-3/4 SM (GAUZE/BANDAGES/DRESSINGS) IMPLANT
ELECT BLADE 4.0 EZ CLEAN MEGAD (MISCELLANEOUS) ×2
ELECT REM PT RETURN 9FT ADLT (ELECTROSURGICAL) ×2
ELECTRODE BLDE 4.0 EZ CLN MEGD (MISCELLANEOUS) ×1 IMPLANT
ELECTRODE REM PT RTRN 9FT ADLT (ELECTROSURGICAL) ×1 IMPLANT
EVACUATOR SILICONE 100CC (DRAIN) IMPLANT
GLOVE BIO SURGEON STRL SZ 6.5 (GLOVE) ×8 IMPLANT
GLOVE BIO SURGEON STRL SZ7.5 (GLOVE) ×2 IMPLANT
GLOVE BIOGEL PI IND STRL 8 (GLOVE) ×1 IMPLANT
GLOVE BIOGEL PI INDICATOR 8 (GLOVE) ×1
GLOVE SURG SS PI 7.0 STRL IVOR (GLOVE) ×2 IMPLANT
GOWN PREVENTION PLUS XLARGE (GOWN DISPOSABLE) ×2 IMPLANT
GOWN STRL REUS W/ TWL LRG LVL3 (GOWN DISPOSABLE) ×3 IMPLANT
GOWN STRL REUS W/TWL LRG LVL3 (GOWN DISPOSABLE) ×3
IMPL GEL HI PROFILE 325CC (Breast) ×1 IMPLANT
IMPLANT BREAST GEL 300CC (Breast) ×2 IMPLANT
IMPLANT GEL HI PROFILE 325CC (Breast) ×2 IMPLANT
IV NS 1000ML (IV SOLUTION)
IV NS 1000ML BAXH (IV SOLUTION) IMPLANT
IV NS 500ML (IV SOLUTION) ×1
IV NS 500ML BAXH (IV SOLUTION) ×1 IMPLANT
KIT FILL SYSTEM UNIVERSAL (SET/KITS/TRAYS/PACK) IMPLANT
NDL SAFETY ECLIPSE 18X1.5 (NEEDLE) ×1 IMPLANT
NEEDLE HYPO 18GX1.5 SHARP (NEEDLE) ×1
NEEDLE HYPO 25X1 1.5 SAFETY (NEEDLE) ×2 IMPLANT
PACK BASIN DAY SURGERY FS (CUSTOM PROCEDURE TRAY) ×2 IMPLANT
PENCIL BUTTON HOLSTER BLD 10FT (ELECTRODE) ×2 IMPLANT
PIN SAFETY STERILE (MISCELLANEOUS) IMPLANT
SIZER BREAST GEL REUSE 300CC (SIZER) ×2
SIZER BREAST GEL REUSE 325CC (SIZER) ×2
SIZER BREAST GENERIC MENTOR (SIZER) ×4 IMPLANT
SIZER BREAST REUSE 300CC (SIZER) ×2 IMPLANT
SIZER BREAST REUSE 345CC (SIZER) ×2 IMPLANT
SIZER BRST GEL REUSE 300CC (SIZER) ×1 IMPLANT
SIZER BRST GEL REUSE 325CC (SIZER) ×1 IMPLANT
SIZER GENERIC MENTOR (SIZER) ×4 IMPLANT
SLEEVE SCD COMPRESS KNEE MED (MISCELLANEOUS) ×2 IMPLANT
SLEEVE SURGEON STRL (DRAPES) ×2 IMPLANT
SPONGE GAUZE 4X4 12PLY STER LF (GAUZE/BANDAGES/DRESSINGS) IMPLANT
SPONGE LAP 18X18 X RAY DECT (DISPOSABLE) ×4 IMPLANT
SUT MNCRL AB 4-0 PS2 18 (SUTURE) ×6 IMPLANT
SUT MON AB 3-0 SH 27 (SUTURE) ×4
SUT MON AB 3-0 SH27 (SUTURE) ×4 IMPLANT
SUT MON AB 5-0 PS2 18 (SUTURE) ×4 IMPLANT
SUT PDS AB 2-0 CT2 27 (SUTURE) IMPLANT
SUT VIC AB 3-0 SH 27 (SUTURE)
SUT VIC AB 3-0 SH 27X BRD (SUTURE) IMPLANT
SUT VICRYL 4-0 PS2 18IN ABS (SUTURE) IMPLANT
SYR 50ML LL SCALE MARK (SYRINGE) IMPLANT
SYR BULB IRRIGATION 50ML (SYRINGE) ×2 IMPLANT
SYR CONTROL 10ML LL (SYRINGE) ×2 IMPLANT
TOWEL OR 17X24 6PK STRL BLUE (TOWEL DISPOSABLE) ×4 IMPLANT
TUBE CONNECTING 20X1/4 (TUBING) ×2 IMPLANT
UNDERPAD 30X30 INCONTINENT (UNDERPADS AND DIAPERS) ×4 IMPLANT
YANKAUER SUCT BULB TIP NO VENT (SUCTIONS) ×2 IMPLANT

## 2013-11-10 NOTE — H&P (View-Only) (Signed)
Kelly [name] is an 53 y.o. female.   Chief Complaint: acquired absence of bilateral breasts HPI: The patient is a 53 yrs old wf here for history and physical for bilateral breast reconstruction for her secondary procedure. She has expanders in place. She had left anterior rib fractures from a fall last month. She is very anxious to have her exchange surgery done as soon as possible as she does not feel like she can tolerate the pain from the expanders much longer.  She underwent a bone scan earlier in the week which reportedly showed that her fractures are healing.  The expanders are appropriately expanded and there is no sign of infection. She is very thin over the expanders even on the right latissimus side. She states she would just like to proceed to exchange as soon as possible. Her expanders are 300/275 on the left and 280/275 on the right.   Past Medical History  Diagnosis Date  . Anxiety   . Depression   . Breast cancer 05/05/12    right, ER +, PR -, Her 2 -  . Arthritis   . History of radiation therapy 07/14/2012-09/03/12    right chest wall 59.4 gray  . Sleep apnea     cpap  . H/O dizziness   . Abnormal Pap smear of cervix     with conization    Past Surgical History  Procedure Laterality Date  . Cesarean section      x3  . Joint replacement      Rt and Lt thumbs  . Cervical conization w/bx    . Breast surgery      several biopsies, needle aspiration  . Mastectomy w/ sentinel node biopsy  06/07/2012    Procedure: MASTECTOMY WITH SENTINEL LYMPH NODE BIOPSY;  Surgeon: Haywood Lasso, MD;  Location: [place] [facility];  Service: General;  Laterality: Right;  right total mastectomy and sentinel node  . Mastectomy modified radical  06/07/2012    Procedure: MASTECTOMY MODIFIED RADICAL;  Surgeon: Haywood Lasso, MD;  Location: [place];  Service: General;  Laterality: Right;  . Tubal ligation    . Simple mastectomy with axillary sentinel node biopsy Left 09/30/2012    Procedure:  SIMPLE MASTECTOMY;  Surgeon: Haywood Lasso, MD;  Location: [place] [place];  Service: General;  Laterality: Left;  left total mastectomy  . Latissimus flap to breast Right 06/06/2013    Procedure: RIGHT LATISSIMUS MYOCUTANEOUS FLAP WITH PLACEMENT OF TISSUE EXPANDER IN RIGHT BREAST ;  Surgeon: Theodoro Kos, DO;  Location: [facility];  Service: Plastics;  Laterality: Right;  . Breast reconstruction with placement of tissue expander and flex hd (acellular hydrated dermis) Left 06/06/2013    Procedure: PLACEMENT OF LEFT BREAST TISSUE EXPANDER AND FLEX HD ;  Surgeon: Theodoro Kos, DO;  Location: [facility];  Service: Plastics;  Laterality: Left;    Family History  Problem Relation Age of Onset  . Breast cancer Mother 78  . Lymphoma Mother 55    non-hodgkins lymphoma in her epiglottis  . Cancer Mother     non-hodkins lymphoma, breast  . Stroke Maternal Grandfather   . Bipolar disorder Brother   . Gout Brother   . Breast cancer Other     MGM's sister; dx <50  . Breast cancer Other 24    MGF's sister  . Brain cancer Other     MGM's sister; dx <50  . Leukemia Other     mother's maternal cousin; dx under 44  Social History:  reports that she has never smoked. She has never used smokeless tobacco. She reports that she drinks about .5 ounces of alcohol per week. She reports that she does not use illicit drugs.  Allergies:  Allergies  Allergen Reactions  . Doxycycline Rash     (Not in a hospital admission)  No results found for this or any previous visit (from the past 48 hour(s)). No results found.  Review of Systems  Constitutional: Negative.   HENT: Negative.   Eyes: Negative.   Respiratory: Negative.   Cardiovascular: Negative.   Gastrointestinal: Negative.   Genitourinary: Negative.   Musculoskeletal: Negative.   Skin: Negative.   Neurological: Negative.   Psychiatric/Behavioral: Negative.     Last menstrual period 08/29/2011. Physical Exam  Constitutional: She  appears well-developed and well-nourished.  HENT:  Head: Normocephalic and atraumatic.  Eyes: Conjunctivae and EOM are normal. Pupils are equal, round, and reactive to light.  Cardiovascular: Normal rate.   Respiratory: Effort normal.  Musculoskeletal: Normal range of motion.  Neurological: She is alert.  Skin: Skin is warm.  Psychiatric: She has a normal mood and affect. Her behavior is normal. Thought content normal.     Assessment/Plan Removal of bilateral expanders and placement of silicone implants with bilateral capsulotomies.   Bobbyjoe Pabst Sanger 11/04/2013, 10:44 AM

## 2013-11-10 NOTE — Transfer of Care (Signed)
Immediate Anesthesia Transfer of Care Note  Patient: Kelly [name]  Procedure(s) Performed: Procedure(s): REMOVAL OF BILATERAL TISSUE EXPANDERS WITH PLACEMENT OF SILICONE BREAST IMPLANTS WITH BILATERAL CAPSULOTOMIES (Bilateral)  Patient Location: PACU  Anesthesia Type:General  Level of Consciousness: awake, sedated and responds to stimulation  Airway & Oxygen Therapy: Patient Spontanous Breathing and Patient connected to face mask oxygen  Post-op Assessment: Report given to PACU RN, Post -op Vital signs reviewed and stable and Patient moving all extremities  Post vital signs: Reviewed and stable  Complications: No apparent anesthesia complications

## 2013-11-10 NOTE — Anesthesia Preprocedure Evaluation (Signed)
Anesthesia Evaluation  Patient identified by MRN, date of birth, ID band Patient awake    Reviewed: Allergy & Precautions, H&P , NPO status , Patient's Chart, lab work & pertinent test results  Airway       Dental no notable dental hx.    Pulmonary sleep apnea ,    Pulmonary exam normal       Cardiovascular negative cardio ROS      Neuro/Psych Anxiety Depression negative neurological ROS     GI/Hepatic negative GI ROS, Neg liver ROS,   Endo/Other  negative endocrine ROS  Renal/GU negative Renal ROS  negative genitourinary   Musculoskeletal   Abdominal   Peds  Hematology negative hematology ROS (+)   Anesthesia Other Findings   Reproductive/Obstetrics negative OB ROS                           Anesthesia Physical Anesthesia Plan  ASA: II  Anesthesia Plan: General   Post-op Pain Management:    Induction: Intravenous  Airway Management Planned: LMA  Additional Equipment:   Intra-op Plan:   Post-operative Plan: Extubation in OR  Informed Consent: I have reviewed the patients History and Physical, chart, labs and discussed the procedure including the risks, benefits and alternatives for the proposed anesthesia with the patient or authorized representative who has indicated his/her understanding and acceptance.   Dental advisory given  Plan Discussed with: CRNA  Anesthesia Plan Comments:         Anesthesia Quick Evaluation

## 2013-11-10 NOTE — Discharge Instructions (Signed)
May shower starting tomorrow Continue with binder or sports bra    Post Anesthesia Home Care Instructions  Activity: Get plenty of rest for the remainder of the day. A responsible adult should stay with you for 24 hours following the procedure.  For the next 24 hours, DO NOT: -Drive a car -Paediatric nurse -Drink alcoholic beverages -Take any medication unless instructed by your physician -Make any legal decisions or sign important papers.  Meals: Start with liquid foods such as gelatin or soup. Progress to regular foods as tolerated. Avoid greasy, spicy, heavy foods. If nausea and/or vomiting occur, drink only clear liquids until the nausea and/or vomiting subsides. Call your physician if vomiting continues.  Special Instructions/Symptoms: Your throat may feel dry or sore from the anesthesia or the breathing tube placed in your throat during surgery. If this causes discomfort, gargle with warm salt water. The discomfort should disappear within 24 hours.

## 2013-11-10 NOTE — Brief Op Note (Signed)
11/10/2013  3:30 PM  PATIENT:  Kelly [name]  53 y.o. female  PRE-OPERATIVE DIAGNOSIS:  Breast replaced by other means [V43.82] Personal history of malignant neoplasm of breast [V10.3]  POST-OPERATIVE DIAGNOSIS:  Breast replaced by other means [V43.82] Personal history of malignant neoplasm of breast [V10.3]  PROCEDURE:  Procedure(s): REMOVAL OF BILATERAL TISSUE EXPANDERS WITH PLACEMENT OF SILICONE BREAST IMPLANTS WITH BILATERAL CAPSULOTOMIES (Bilateral)  SURGEON:  Surgeon(s) and Role:    * Soliana Kitko Sanger, DO - Primary  PHYSICIAN ASSISTANT: Shawn Rayburn, PA  ASSISTANTS: none   ANESTHESIA:   general  EBL:  Total I/O In: 2000 [I.V.:2000] Out: -   BLOOD ADMINISTERED:none  DRAINS: none   LOCAL MEDICATIONS USED:  MARCAINE     SPECIMEN:  Source of Specimen:  mastectomy scar  DISPOSITION OF SPECIMEN:  PATHOLOGY  COUNTS:  YES  TOURNIQUET:  * No tourniquets in log *  DICTATION: .Dragon Dictation  PLAN OF CARE: Discharge to home after PACU  PATIENT DISPOSITION:  PACU - hemodynamically stable.   Delay start of Pharmacological VTE agent (>24hrs) due to surgical blood loss or risk of bleeding: no

## 2013-11-10 NOTE — Interval H&P Note (Signed)
History and Physical Interval Note:  11/10/2013 7:29 AM  Kelly [name]  has presented today for surgery, with the diagnosis of Breast replaced by other means [V43.82] Personal history of malignant neoplasm of breast [V10.3]  The various methods of treatment have been discussed with the patient and family. After consideration of risks, benefits and other options for treatment, the patient has consented to  Procedure(s): REMOVAL OF BILATERAL TISSUE EXPANDERS WITH PLACEMENT OF SILICONE BREAST IMPLANTS WITH BILATERAL CAPSULOTOMIES (Bilateral) as a surgical intervention .  The patient's history has been reviewed, patient examined, no change in status, stable for surgery.  I have reviewed the patient's chart and labs.  Questions were answered to the patient's satisfaction.     Lyndee Leo [employer]

## 2013-11-10 NOTE — Anesthesia Procedure Notes (Signed)
Procedure Name: LMA Insertion Date/Time: 11/10/2013 1:00 PM Performed by: Melynda Ripple D Pre-anesthesia Checklist: Patient identified, Emergency Drugs available, Suction available and Patient being monitored Patient Re-evaluated:Patient Re-evaluated prior to inductionOxygen Delivery Method: Circle System Utilized Preoxygenation: Pre-oxygenation with 100% oxygen Intubation Type: IV induction Ventilation: Mask ventilation without difficulty LMA: LMA inserted LMA Size: 4.0 Number of attempts: 1 Airway Equipment and Method: bite block Placement Confirmation: positive ETCO2 Tube secured with: Tape Dental Injury: Teeth and Oropharynx as per pre-operative assessment

## 2013-11-10 NOTE — Progress Notes (Signed)
Dr. Glennon Mac called and given update over the phone regqrding pt status.  Discussed discharge with her  And wecame to the conclusion for pt. To D/C to home.  Husband agrees that pt should be D/C'd to home as well.  Pt. Agrees to oxycodone and IV D/C and to go home.

## 2013-11-10 NOTE — Op Note (Signed)
Op report Bilateral Exchange   DATE OF OPERATION: 11/10/2013  LOCATION: [place]  SURGICAL DIVISION: Plastic Surgery  PREOPERATIVE DIAGNOSES:  1. History of breast cancer.  2. Acquired absence of bilateral breast.   POSTOPERATIVE DIAGNOSES:  1. History of breast cancer.  2. Acquired absence of bilateral breast.   PROCEDURE:  1. Bilateral exchange of tissue expanders for implants.  2. Bilateral capsulotomies for implant respositioning.  SURGEON: Theodoro Kos, DO  ASSISTANT: Shawn Rayburn, PA  ANESTHESIA:  General.   COMPLICATIONS: None.   IMPLANTS: Left - Mentor Smooth Round High Profile Gel 325cc. Ref #[id number].  Serial Number [id number] Right - Mentor Shape Medium Heigh, High Profile Gel 300cc. Ref #[id number].  Serial Number [id number]  INDICATIONS FOR PROCEDURE:  The patient, Kelly [name], is a 53 y.o. female born on 1960/10/14, is here for treatment after bilateral mastectomies.  She had tissue expanders placed at the time of mastectomies. She now presents for exchange of her expanders for implants.  She requires capsulotomies to better position the implants.   CONSENT:  Informed consent was obtained directly from the patient. Risks, benefits and alternatives were fully discussed. Specific risks including but not limited to bleeding, infection, hematoma, seroma, scarring, pain, implant infection, implant extrusion, capsular contracture, asymmetry, wound healing problems, and need for further surgery were all discussed. The patient did have an ample opportunity to have her questions answered to her satisfaction.   DESCRIPTION OF PROCEDURE:  The patient was taken to the operating room. SCDs were placed and IV antibiotics were given. The patient's chest was prepped and draped in a sterile fashion. A time out was performed and the implants to be used were identified.  One percent Lidocaine with epinephrine was used to infiltrate the area.   On the left,  the old mastectomy scar was opened and superior mastectomy and inferior mastectomy flaps were re-raised over the pectoralis major muscle. The pectoralis was split to expose the tissue expander which was removed. Inspection of the pocket showed a normal healthy capsule and good integration of the biologic matrix.  On the right, the latissimus was separated from the lower flap to near the inframammary fold.  The muscle was then incised and the expander removed.   Circumferential capsulotomies were performed on each breast to allow for breast pocket expansion on either side. Capsulectomies were done at the medial and inferior aspect. Measurements were made on either side to confirm adequate pocket size for the implant dimensions.  Hemostasis was ensured.  On either side, the gloves were changed. The implant was selected and the muscle edges were reapproximated with 3-0 Monocryl.  The implants were placed in the pockets and oriented appropriately.   On the left, the pectoralis major muscle and capsule on the anterior surface were re-closed with a 3-0 Monocryl suture. On the right, the implant was placed under the latissiumus and the muscle was re-approximated with 3-0 Monocryl.  The remaining skin was closed with 4-0 Monocryl deep dermal and 5-0 Monocryl subcuticular stitches.  Each side had sizers placed and the patient was sat up to check for symmetry.  A breast binder and ABDs were placed.  The patient was allowed to wake from anesthesia and taken to the recovery room in satisfactory condition.

## 2013-11-11 ENCOUNTER — Encounter: Payer: Self-pay | Admitting: Physician Assistant

## 2013-11-11 ENCOUNTER — Ambulatory Visit: Payer: BC Managed Care – PPO | Admitting: Physician Assistant

## 2013-11-11 NOTE — Progress Notes (Signed)
[facility] today.  Letter mailed to patient.  I will mention that it looks like the patient underwent breast reconstruction earlier this week, and was discharged from the hospital only yesterday, 11/10/2013.  This is likely the reason for her missed appointment today.   Micah Flesher, PA-C 11/11/2013

## 2013-11-11 NOTE — Anesthesia Postprocedure Evaluation (Signed)
  Anesthesia Post-op Note  Patient: Kelly [name]  Procedure(s) Performed: Procedure(s): REMOVAL OF BILATERAL TISSUE EXPANDERS WITH PLACEMENT OF SILICONE BREAST IMPLANTS WITH BILATERAL CAPSULOTOMIES (Bilateral)  Patient Location: PACU  Anesthesia Type:General  Level of Consciousness: awake, alert , oriented and patient cooperative  Airway and Oxygen Therapy: Patient Spontanous Breathing  Post-op Pain: mild  Post-op Assessment: Post-op Vital signs reviewed, Patient's Cardiovascular Status Stable, Respiratory Function Stable and Pain level controlled, nausea improved  Post-op Vital Signs: Reviewed and stable  Last Vitals:  Filed Vitals:   11/10/13 1830  BP: 122/70  Pulse: 89  Temp: 36.7 C  Resp: 16    Complications: No apparent anesthesia complications

## 2013-11-14 ENCOUNTER — Ambulatory Visit (INDEPENDENT_AMBULATORY_CARE_PROVIDER_SITE_OTHER): Payer: BC Managed Care – PPO | Admitting: Neurology

## 2013-11-14 ENCOUNTER — Encounter: Payer: Self-pay | Admitting: Neurology

## 2013-11-14 VITALS — BP 94/62 | HR 89 | Temp 97.6°F | Ht 64.0 in | Wt 117.0 lb

## 2013-11-14 DIAGNOSIS — R259 Unspecified abnormal involuntary movements: Secondary | ICD-10-CM

## 2013-11-14 DIAGNOSIS — R251 Tremor, unspecified: Secondary | ICD-10-CM

## 2013-11-14 DIAGNOSIS — R52 Pain, unspecified: Secondary | ICD-10-CM

## 2013-11-14 NOTE — Patient Instructions (Addendum)
  Please remember, that any kind of tremor may be exacerbated by anxiety, anger, nervousness, excitement, dehydration, sleep deprivation, by caffeine, and low blood sugar values or blood sugar fluctuations. Some medications, especially some antidepressants and lithium can cause or exacerbate tremors. Tremors may temporarily calm down her subside with the use of a benzodiazepine such as Valium or related medications and with alcohol. Be aware however that drinking alcohol is not an approved treatment or appropriate treatment for tremor control and long-term use of benzodiazepines such as Valium, lorazepam, alprazolam, or clonazepam can cause habit formation, physical and psychological addiction.  I can see you back as needed. Continue Yoga for relaxation and balance.

## 2013-11-14 NOTE — Progress Notes (Signed)
Subjective:    Patient ID: Kelly [name] is a 53 y.o. female.  HPI    Star Age, MD, PhD [facility] [address] [place], [place] [address]  Dear Dr. Orland Mustard,   I saw your patient, Kelly [name], upon your kind request in my neurologic clinic today for initial consultation of her hand tremors. The patient is unaccompanied today. As you know, [name] is a very pleasant 53 year old right-handed woman with an underlying medical history of breast cancer (s/p mastectomy on R 11/13, L prophylactic 3/14), diagnosed in October 2013, depression, obstructive sleep apnea on CPAP, allergic rhinitis, and osteoarthritis, s/p b/l thumb surgeries, who has had a bilateral upper extremity tremor for years. She feels that this has worsened. She also recently reported to you memory loss. She describes a longer-standing history of upper extremity tremor primarily in her hands for the past 4 years, particularly with action and posture, not at rest. In the past 8 or 9 months it has become worse. Holding her hands forcefully in her hands in her lap seems to help, but she can feel it inside. She does not see a correlation with feeling anxious or nervous. She has 2 brothers, none with tremors. Her father's side of the medical history is not well known to her, as he was adopted.   She also describes other symptoms, including all over body pain, neck pain, memory loss, word finding difficulty, difficulty swallowing, like a globus sensation.   She had a neck soft tissue CT in 1/15: No neck mass or suspicious cervical lymph nodes. No acute or metastatic process identified in the neck. 2. Stable degenerative changes at the left sternoclavicular joint. Negative visualized upper chest.  She had a brain MRI w/wo contrast in 2/14: Linear enhancement within the central pons. 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 2. No  pathologic enhancement to suggest metastatic disease to the brain or meninges. 3. Minimal fluid in the mastoid air cells without evidence for an obstructing nasopharyngeal lesion.  4. Normal MRI appearance of the brain.   Her Past Medical History Is Significant For: Past Medical History  Diagnosis Date  . Anxiety   . Depression   . Breast cancer 05/05/12    right, ER +, PR -, Her 2 -  . Arthritis   . History of radiation therapy 07/14/2012-09/03/12    right chest wall 59.4 gray  . H/O dizziness   . Abnormal Pap smear of cervix     with conization  . Sleep apnea     cpap-has not been using-doing well without it    Her Past Surgical History Is Significant For: Past Surgical History  Procedure Laterality Date  . Cesarean section      x3  . Joint replacement      Rt and Lt thumbs  . Cervical conization w/bx    . Breast surgery      several biopsies, needle aspiration  . Mastectomy w/ sentinel node biopsy  06/07/2012    Procedure: MASTECTOMY WITH SENTINEL LYMPH NODE BIOPSY;  Surgeon: Haywood Lasso, MD;  Location: [facility];  Service: General;  Laterality: Right;  right total mastectomy and sentinel node  . Mastectomy modified radical  06/07/2012    Procedure: MASTECTOMY MODIFIED RADICAL;  Surgeon: Haywood Lasso, MD;  Location: [place];  Service: General;  Laterality: Right;  . Tubal ligation    . Simple mastectomy with axillary sentinel node  biopsy Left 09/30/2012    Procedure: SIMPLE MASTECTOMY;  Surgeon: Haywood Lasso, MD;  Location: [place];  Service: General;  Laterality: Left;  left total mastectomy  . Latissimus flap to breast Right 06/06/2013    Procedure: RIGHT LATISSIMUS MYOCUTANEOUS FLAP WITH PLACEMENT OF TISSUE EXPANDER IN RIGHT BREAST ;  Surgeon: Theodoro Kos, DO;  Location: [place];  Service: Plastics;  Laterality: Right;  . Breast reconstruction with placement of tissue expander and flex hd (acellular hydrated dermis) Left 06/06/2013    Procedure:  PLACEMENT OF LEFT BREAST TISSUE EXPANDER AND FLEX HD ;  Surgeon: Theodoro Kos, DO;  Location: [place];  Service: Plastics;  Laterality: Left;    Her Family History Is Significant For: Family History  Problem Relation Age of Onset  . Breast cancer Mother 28  . Lymphoma Mother 22    non-hodgkins lymphoma in her epiglottis  . Cancer Mother     non-hodkins lymphoma, breast  . Stroke Maternal Grandfather   . Bipolar disorder Brother   . Gout Brother   . Breast cancer Other     MGM's sister; dx <50  . Breast cancer Other 34    MGF's sister  . Brain cancer Other     MGM's sister; dx <50  . Leukemia Other     mother's maternal cousin; dx under 10    Her Social History Is Significant For: History   Social History  . Marital Status: Married    Spouse Name: N/A    Number of Children: N/A  . Years of Education: N/A   Social History Main Topics  . Smoking status: Never Smoker   . Smokeless tobacco: Never Used  . Alcohol Use: 0.5 oz/week    1 drink(s) per week     Comment: socially  . Drug Use: No  . Sexual Activity: Yes    Partners: Male     Comment: menarche 77, P6,G 26, 7st pregnancy-stillborn age 15, BTL   Other Topics Concern  . None   Social History Narrative  . None    Her Allergies Are:  Allergies  Allergen Reactions  . Doxycycline Rash  :   Her Current Medications Are:  Outpatient Encounter Prescriptions as of 11/14/2013  Medication Sig  . amphetamine-dextroamphetamine (ADDERALL XR) 30 MG 24 hr capsule Take 30 mg by mouth daily.   . Biotin 10 MG TABS Take 1 tablet by mouth daily.  Marland Kitchen buPROPion (WELLBUTRIN XL) 150 MG 24 hr tablet Take 150 mg by mouth daily.   . cephALEXin (KEFLEX) 500 MG capsule Take 1 capsule by mouth 4 (four) times daily.  . Cholecalciferol (VITAMIN D-3) 5000 UNITS TABS Take 5,000 Units by mouth daily.  . Coconut Oil 1000 MG CAPS Take by mouth.  . traZODone (DESYREL) 50 MG tablet Take 150 mg by mouth at bedtime.  Marland Kitchen VIIBRYD 40 MG TABS Take 40  mg by mouth daily.   . vitamin E 100 UNIT capsule Take 100 Units by mouth daily.  Marland Kitchen lidocaine-prilocaine (EMLA) cream Apply 45 to 60 minutes  . tamoxifen (NOLVADEX) 20 MG tablet Take 20 mg by mouth daily.  :   Review of Systems:  Out of a complete 14 point review of systems, all are reviewed and negative with the exception of these symptoms as listed below:   Review of Systems  Constitutional: Positive for fatigue.  HENT: Positive for trouble swallowing.   Eyes: Negative.   Respiratory: Positive for apnea (snoring) and cough.   Cardiovascular:  Negative.   Gastrointestinal: Negative.   Endocrine: Positive for polydipsia.  Genitourinary: Positive for enuresis.  Musculoskeletal: Positive for arthralgias and myalgias.  Skin: Negative.   Allergic/Immunologic: Negative.   Neurological: Positive for tremors, syncope, speech difficulty, weakness and headaches.       Memory loss  Hematological: Positive for adenopathy. Bruises/bleeds easily.  Psychiatric/Behavioral: Positive for sleep disturbance (insomnia, snoring) and dysphoric mood.    Objective:  Neurologic Exam  Physical Exam Physical Examination:   Filed Vitals:   11/14/13 1128  BP: 94/62  Pulse: 89  Temp: 97.6 F (36.4 C)    General Examination: The patient is a very pleasant 53 y.o. female in no acute distress. She appears well-developed and well-nourished and well groomed. She is mildly anxious appearing.   HEENT: Normocephalic, atraumatic, pupils are equal, round and reactive to light and accommodation. Funduscopic exam is normal with sharp disc margins noted. Extraocular tracking is good without limitation to gaze excursion or nystagmus noted. Normal smooth pursuit is noted. Hearing is grossly intact. Tympanic membranes are clear bilaterally. Face is symmetric with normal facial animation and normal facial sensation. Speech is clear with no dysarthria noted. There is no hypophonia. There is no lip, neck/head, jaw or  voice tremor. Neck is supple with full range of passive and active motion. There are no carotid bruits on auscultation. Oropharynx exam reveals: mild mouth dryness, adequate dental hygiene and no significant airway crowding. Mallampati is class II. Tongue protrudes centrally and palate elevates symmetrically.   Chest: Clear to auscultation without wheezing, rhonchi or crackles noted.  Heart: S1+S2+0, regular and normal without murmurs, rubs or gallops noted.   Abdomen: Soft, non-tender and non-distended with normal bowel sounds appreciated on auscultation.  Extremities: There is no pitting edema in the distal lower extremities bilaterally. Pedal pulses are intact.  Skin: Warm and dry without trophic changes noted. There are no varicose veins.  Musculoskeletal: exam reveals no obvious joint deformities, tenderness or joint swelling or erythema.   Neurologically:  Mental status: The patient is awake, alert and oriented in all 4 spheres. Her immediate and remote memory, attention, language skills and fund of knowledge are appropriate. There is no evidence of aphasia, agnosia, apraxia or anomia. Speech is clear with normal prosody and enunciation. Thought process is linear. Mood is normal and affect is blunted.  Cranial nerves II - XII are as described above under HEENT exam. In addition: shoulder shrug is normal with equal shoulder height noted. Motor exam: Normal bulk, strength and tone is noted. There is no drift, or rebound. There is no resting tremor. There is a bilateral upper extremity postural and action tremor, which is minimal in degree. There tremor frequency is fairly fast and the amplitude is small. On Archimedes spiral drawing there is no significant tremulousness noted. Handwriting is not particularly tremulous and legible. There is no evidence of micrographia.  Romberg is negative. Reflexes are 2+ throughout. Babinski: Toes are flexor bilaterally. Fine motor skills and coordination:  intact with normal finger taps, normal hand movements, normal rapid alternating patting, normal foot taps and normal foot agility.  Cerebellar testing: No dysmetria or intention tremor on finger to nose testing. Heel to shin is unremarkable bilaterally. There is no truncal or gait ataxia.  Sensory exam: intact to light touch, pinprick, vibration, temperature sense and proprioception in the upper and lower extremities.  Gait, station and balance: She stands easily. No veering to one side is noted. No leaning to one side is noted. Posture is age-appropriate  and stance is narrow based. Gait shows normal stride length and normal pace. No problems turning are noted. She turns en bloc. Tandem walk is slightly difficult for her and so is toe and heel stance.               Assessment and Plan:   In summary, JESSIKAH DICKER is a very pleasant 53 y.o.-year old female with an underlying medical history of breast cancer, diagnosed in October 2013, depression, obstructive sleep apnea on CPAP, allergic rhinitis, and osteoarthritis, who reports hand tremors for the past 4 years, worse in the last 8-9 months. Her physical exam is largely non-focal with the exception of a mild UE action and postural tremor. There is no evidence of parkinsonism. As far as her other complaints such diffuse body pain, and subjective swallowing difficulty, I am not sure how to tie this in together. She had a brain MRI one year ago, which was negative. She may benefit from seeing a rheumatologist. Her DDX for tremor is most likely a familial tremor vs. enhanced physiological tremor.  I had a long chat with the patient about my findings and the diagnosis of tremor, the prognosis and treatment options. We talked about medical treatments and non-pharmacological approaches. We talked about maintaining a healthy lifestyle in general. I encouraged the patient to eat healthy, exercise daily and keep well hydrated, to keep a scheduled bedtime and wake  time routine, to not skip any meals and eat healthy snacks in between meals and to have protein with every meal. I explained common tremor triggers as well to her. I do not believe we need to pursue further testing, her tremor is rather mild and therefore, I would like to suggest no symptomatic treatment.  I answered all her questions today and the patient was in agreement with the above outlined plan. I think I can see the patient on an as needed basis.   Thank you very much for allowing me to participate in the care of this nice patient. If I can be of any further assistance to you please do not hesitate to call me at [phone].  Sincerely,   Star Age, MD, PhD

## 2013-11-15 ENCOUNTER — Telehealth: Payer: Self-pay | Admitting: Physician Assistant

## 2013-11-15 DIAGNOSIS — Z9889 Other specified postprocedural states: Secondary | ICD-10-CM | POA: Insufficient documentation

## 2013-11-15 DIAGNOSIS — Z9882 Breast implant status: Secondary | ICD-10-CM | POA: Insufficient documentation

## 2013-11-15 DIAGNOSIS — Z978 Presence of other specified devices: Secondary | ICD-10-CM | POA: Insufficient documentation

## 2013-11-15 NOTE — Telephone Encounter (Signed)
, °

## 2013-12-27 ENCOUNTER — Ambulatory Visit: Payer: BC Managed Care – PPO | Admitting: Physician Assistant

## 2013-12-27 ENCOUNTER — Telehealth: Payer: Self-pay | Admitting: Obstetrics & Gynecology

## 2013-12-27 ENCOUNTER — Telehealth: Payer: Self-pay | Admitting: Physician Assistant

## 2013-12-27 ENCOUNTER — Other Ambulatory Visit (HOSPITAL_BASED_OUTPATIENT_CLINIC_OR_DEPARTMENT_OTHER): Payer: BC Managed Care – PPO

## 2013-12-27 ENCOUNTER — Encounter: Payer: Self-pay | Admitting: *Deleted

## 2013-12-27 DIAGNOSIS — C50519 Malignant neoplasm of lower-outer quadrant of unspecified female breast: Secondary | ICD-10-CM

## 2013-12-27 DIAGNOSIS — N950 Postmenopausal bleeding: Secondary | ICD-10-CM

## 2013-12-27 DIAGNOSIS — C50419 Malignant neoplasm of upper-outer quadrant of unspecified female breast: Secondary | ICD-10-CM

## 2013-12-27 DIAGNOSIS — C773 Secondary and unspecified malignant neoplasm of axilla and upper limb lymph nodes: Secondary | ICD-10-CM

## 2013-12-27 DIAGNOSIS — C50911 Malignant neoplasm of unspecified site of right female breast: Secondary | ICD-10-CM

## 2013-12-27 DIAGNOSIS — C50219 Malignant neoplasm of upper-inner quadrant of unspecified female breast: Secondary | ICD-10-CM

## 2013-12-27 LAB — CBC WITH DIFFERENTIAL/PLATELET
BASO%: 0.3 % (ref 0.0–2.0)
Basophils Absolute: 0 10*3/uL (ref 0.0–0.1)
EOS%: 2.8 % (ref 0.0–7.0)
Eosinophils Absolute: 0.2 10*3/uL (ref 0.0–0.5)
HCT: 37.5 % (ref 34.8–46.6)
HGB: 12.5 g/dL (ref 11.6–15.9)
LYMPH%: 12.1 % — ABNORMAL LOW (ref 14.0–49.7)
MCH: 30.3 pg (ref 25.1–34.0)
MCHC: 33.3 g/dL (ref 31.5–36.0)
MCV: 91 fL (ref 79.5–101.0)
MONO#: 0.6 10*3/uL (ref 0.1–0.9)
MONO%: 8 % (ref 0.0–14.0)
NEUT#: 5.4 10*3/uL (ref 1.5–6.5)
NEUT%: 76.8 % (ref 38.4–76.8)
Platelets: 215 10*3/uL (ref 145–400)
RBC: 4.12 10*6/uL (ref 3.70–5.45)
RDW: 13.7 % (ref 11.2–14.5)
WBC: 7.1 10*3/uL (ref 3.9–10.3)
lymph#: 0.9 10*3/uL (ref 0.9–3.3)

## 2013-12-27 LAB — COMPREHENSIVE METABOLIC PANEL (CC13)
ALT: 16 U/L (ref 0–55)
AST: 21 U/L (ref 5–34)
Albumin: 3.5 g/dL (ref 3.5–5.0)
Alkaline Phosphatase: 91 U/L (ref 40–150)
Anion Gap: 13 mEq/L — ABNORMAL HIGH (ref 3–11)
BUN: 17.3 mg/dL (ref 7.0–26.0)
CO2: 25 mEq/L (ref 22–29)
Calcium: 9 mg/dL (ref 8.4–10.4)
Chloride: 102 mEq/L (ref 98–109)
Creatinine: 0.8 mg/dL (ref 0.6–1.1)
Glucose: 95 mg/dl (ref 70–140)
Potassium: 4.2 mEq/L (ref 3.5–5.1)
Sodium: 140 mEq/L (ref 136–145)
Total Bilirubin: 1.08 mg/dL (ref 0.20–1.20)
Total Protein: 6.9 g/dL (ref 6.4–8.3)

## 2013-12-27 NOTE — Telephone Encounter (Signed)
Spoke with patient. Advised OOP cost for office visit with possible EMB would be $45 and office visit with PUS and possible EMB would be $45 (per [place]). Patient would like to come in tomorrow to be seen by another provider and have possible EMB. Patient asking if she comes in tomorrow could she come in on Thursday for an ultrasound with Dr.Miller if it is recommended. Advised patient when she is seen in office tomorrow this can be discussed further and if it is recommended we could get her scheduled at that time. Patient agreeable. Appointment scheduled tomorrow with Dr.Lathrop at 1430 (time per provider). Patient agreeable and verbalizes understanding.  CC:Dr.Lathrop  Routing to provider for final review. Patient agreeable to disposition. Will close encounter

## 2013-12-27 NOTE — Telephone Encounter (Signed)
Patient states she has been spotting for a few weeks now and now has pain in lower right side.

## 2013-12-27 NOTE — Telephone Encounter (Signed)
pt cld stating missed appt today-cld pt back left message to call back to r/s appt

## 2013-12-27 NOTE — Telephone Encounter (Addendum)
Spoke with patient. Advised patient that [place] recommends a PUS and possible EMB at 4pm and consult with Dr.Miller at 4:30pm on Thursday. Patient states that she went to the restroom before leaving another appointment and there is a "fair" amount of spotting still. Patient would like to know if another doctor could perform tomorrow. Advised patient that we do not have ultrasound available on Wednesday for the the ultrasound but an EMB could be taken after evaluation with another provider if recommended. Patient would like to know how much will be required for PUS and EMB and then schedule at that time. Patient would like to know availability for Wednesday to see another provider. Advised would have to speak with providers who will be in the office tomorrow and give patient a call back with scheduling availability as well as OOP cost for appointment. Patient agreeable.

## 2013-12-27 NOTE — Telephone Encounter (Signed)
Spoke with patient. Patient states that she has been having discharge for a couple of weeks. It started off as milky white and then progressed to brown and now red. Patient states that she mainly sees spotting with wiping after urination. Not currently wearing a pad or liner. Denies fever, nausea, and chills. Patient states that she is having abdominal pain in the lower right side that is sharp and a 10/10 with movement. Pain began last night and has continued throughout the day. Advised patient that she will need to be seen in office or ER today for treatment. Advised would speak with Dr.Miller and give patient a call back regarding necessary action. Patient agreeable.

## 2013-12-27 NOTE — Progress Notes (Signed)
This RN spoke with pt per her late arrival and inquiry about being worked in for visit today. Informed pt all providers are currently booked- per this RN's review and inquiry with midlevel.  Per discussion with pt - Kelly [name] states " I tried to call about my appointment today and they kept putting me to scheduling - but then no one returned by call "  This RN offered pt next available appt with LC/NP for 845 am 6/3 due to concerns she is having.  Per pt's request this RN requested recent labs and scan results from Dr Gavin Pound office.

## 2013-12-27 NOTE — Telephone Encounter (Signed)
Spoke with patient. Advised that she be seen in ER today for pain level of 10/10 in the right side. Advised of importance of being evaluated soon and that she should not wait until tomorrow. Patient states " It is not that bad when I am not moving and I have a doctors appointment tomorrow. I may just wait and be seen at that appointment." Advised patient with sharp 10/10 pain we highly recommend that she be seen in the ER today for treatment. Advised patient that [place] would like to have patient come in for a PUS with possible EMB due to spotting. Advised Dr.Miller is out of the office tomorrow but will be available Thursday if patient could come in. Patient agreeable. Advised would check with Dr.Miller about scheduling on Thursday and give patient a call back. Patient agreeable.

## 2013-12-28 ENCOUNTER — Ambulatory Visit (INDEPENDENT_AMBULATORY_CARE_PROVIDER_SITE_OTHER): Payer: BC Managed Care – PPO | Admitting: Gynecology

## 2013-12-28 ENCOUNTER — Encounter: Payer: Self-pay | Admitting: Gynecology

## 2013-12-28 ENCOUNTER — Encounter: Payer: Self-pay | Admitting: Adult Health

## 2013-12-28 ENCOUNTER — Ambulatory Visit (HOSPITAL_BASED_OUTPATIENT_CLINIC_OR_DEPARTMENT_OTHER): Payer: BC Managed Care – PPO | Admitting: Adult Health

## 2013-12-28 ENCOUNTER — Telehealth: Payer: Self-pay

## 2013-12-28 VITALS — BP 104/56 | HR 80 | Temp 98.5°F | Ht 64.75 in | Wt 116.0 lb

## 2013-12-28 VITALS — BP 101/64 | HR 97 | Temp 98.8°F | Resp 18 | Ht 64.0 in | Wt 117.9 lb

## 2013-12-28 DIAGNOSIS — Z170 Estrogen receptor positive status [ER+]: Secondary | ICD-10-CM

## 2013-12-28 DIAGNOSIS — C50519 Malignant neoplasm of lower-outer quadrant of unspecified female breast: Secondary | ICD-10-CM

## 2013-12-28 DIAGNOSIS — C50911 Malignant neoplasm of unspecified site of right female breast: Secondary | ICD-10-CM

## 2013-12-28 DIAGNOSIS — C50919 Malignant neoplasm of unspecified site of unspecified female breast: Secondary | ICD-10-CM

## 2013-12-28 DIAGNOSIS — IMO0001 Reserved for inherently not codable concepts without codable children: Secondary | ICD-10-CM

## 2013-12-28 DIAGNOSIS — N950 Postmenopausal bleeding: Secondary | ICD-10-CM

## 2013-12-28 LAB — CANCER ANTIGEN 27.29: CA 27.29: 20 U/mL (ref 0–39)

## 2013-12-28 NOTE — Progress Notes (Signed)
Endometrial Biopsy Procedure Note  Indications: bleeding on postmenopausal hormone replacement   Pt here reporting vaginal bleeding.  Pt's cycles were regular until diagnosed with breast cancer.  Pt was treated with mastectomy on right and prophylactic on left.  Pt treated with post-op radiation but no chemo.  She was started on tamoxifen but did not take more than 1 month due to intolerance.   Pt reports bleeding started intermittently for 57m, variable amount, spotting.  Red and brown.  No cramping, midline pelvic pressure.   Pt reports bilateral leg pain, right hip pain. No dyspareunia, mild bleeding after sex, spots  BP 104/56  Pulse 80  Temp(Src) 98.5 F (36.9 C) (Oral)  Ht 5' 4.75" (1.645 m)  Wt 116 lb (52.617 kg)  BMI 19.44 kg/m2 General appearance: alert, cooperative and appears stated age Abdomen: soft, non-tender; bowel sounds normal; no masses,  no organomegaly  Pelvic: External genitalia:  no lesions              Urethra:  normal appearing urethra with no masses, tenderness or lesions              Bartholins and Skenes: normal                 Vagina: normal appearing vagina with normal color and discharge, no lesions              Cervix: scarred, flush on right c/w prior cone                     Bimanual Exam:  Uterus:  uterus is normal size, shape, consistency and nontender                                      Adnexa: normal adnexa in size, nontender and no masses                                        Procedure Details   The risks (including infection, bleeding, pain, and uterine perforation) and benefits of the procedure were explained to the patient and written informed consent was obtained.    The patient was placed in the dorsal lithotomy position.  Bimanual exam was performed to assess uterine size and position.  A  speculum inserted in the vagina, and the cervix prepped with povidone iodine. Xylocaine jelly placed anterior and endocervix.  A sharp tenaculum  was   applied to the cervix.  Dilation of the cervix was  Necessary.. Os finder used to assess canal    A curved pipelle was used to sample the endometrium.  The uterus sounded to  7 cm.   Moderate tissue obtained. Sample was sent for pathologic examination.  Condition: Stable  Complications: None  Plan:  The patient was advised to call for any fever or for prolonged or severe pain or bleeding. She was advised to use OTC pain relievers as needed for mild to moderate pain. She was advised to avoid vaginal intercourse for 48 hours or until the bleeding has completely stopped.  An after visit summary was provided to the patient.
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
## 2013-12-28 NOTE — Patient Instructions (Signed)
You are doing well.  You have no sign of recurrence of your breast cancer.  I recommend a healthy diet, exercise, and monthly breast exams.    Please call us if you have any questions or concerns.    We will see you back in 6 months.    Breast Self-Awareness Practicing breast self-awareness may pick up problems early, prevent significant medical complications, and possibly save your life. By practicing breast self-awareness, you can become familiar with how your breasts look and feel and if your breasts are changing. This allows you to notice changes early. It can also offer you some reassurance that your breast health is good. One way to learn what is normal for your breasts and whether your breasts are changing is to do a breast self-exam. If you find a lump or something that was not present in the past, it is best to contact your caregiver right away. Other findings that should be evaluated by your caregiver include nipple discharge, especially if it is bloody; skin changes or reddening; areas where the skin seems to be pulled in (retracted); or new lumps and bumps. Breast pain is seldom associated with cancer (malignancy), but should also be evaluated by a caregiver. HOW TO PERFORM A BREAST SELF-EXAM The best time to examine your breasts is 5 7 days after your menstrual period is over. During menstruation, the breasts are lumpier, and it may be more difficult to pick up changes. If you do not menstruate, have reached menopause, or had your uterus removed (hysterectomy), you should examine your breasts at regular intervals, such as monthly. If you are breastfeeding, examine your breasts after a feeding or after using a breast pump. Breast implants do not decrease the risk for lumps or tumors, so continue to perform breast self-exams as recommended. Talk to your caregiver about how to determine the difference between the implant and breast tissue. Also, talk about the amount of pressure you should use  during the exam. Over time, you will become more familiar with the variations of your breasts and more comfortable with the exam. A breast self-exam requires you to remove all your clothes above the waist. 1. Look at your breasts and nipples. Stand in front of a mirror in a room with good lighting. With your hands on your hips, push your hands firmly downward. Look for a difference in shape, contour, and size from one breast to the other (asymmetry). Asymmetry includes puckers, dips, or bumps. Also, look for skin changes, such as reddened or scaly areas on the breasts. Look for nipple changes, such as discharge, dimpling, repositioning, or redness. 2. Carefully feel your breasts. This is best done either in the shower or tub while using soapy water or when flat on your back. Place the arm (on the side of the breast you are examining) above your head. Use the pads (not the fingertips) of your three middle fingers on your opposite hand to feel your breasts. Start in the underarm area and use  inch (2 cm) overlapping circles to feel your breast. Use 3 different levels of pressure (light, medium, and firm pressure) at each circle before moving to the next circle. The light pressure is needed to feel the tissue closest to the skin. The medium pressure will help to feel breast tissue a little deeper, while the firm pressure is needed to feel the tissue close to the ribs. Continue the overlapping circles, moving downward over the breast until you feel your ribs below your breast.  Then, move one finger-width towards the center of the body. Continue to use the  inch (2 cm) overlapping circles to feel your breast as you move slowly up toward the collar bone (clavicle) near the base of the neck. Continue the up and down exam using all 3 pressures until you reach the middle of the chest. Do this with each breast, carefully feeling for lumps or changes. 3.  Keep a written record with breast changes or normal findings for  each breast. By writing this information down, you do not need to depend only on memory for size, tenderness, or location. Write down where you are in your menstrual cycle, if you are still menstruating. Breast tissue can have some lumps or thick tissue. However, see your caregiver if you find anything that concerns you.  SEEK MEDICAL CARE IF:  You see a change in shape, contour, or size of your breasts or nipples.   You see skin changes, such as reddened or scaly areas on the breasts or nipples.   You have an unusual discharge from your nipples.   You feel a new lump or unusually thick areas.  Document Released: 07/14/2005 Document Revised: 06/30/2012 Document Reviewed: 10/29/2011 [facility] Patient Information 2014 [place].

## 2013-12-28 NOTE — Telephone Encounter (Signed)
Patient was seen today by Dr.Lathrop for EMB. Patient would like to come in to be seen with Dr.Miller tomorrow for an ultrasound as well. Patient would like appointment previously offered to see Dr.Miller before scheduling today with Dr.Lathrop. Patient placed on ultrasound schedule for tomorrow at 4:00pm and consult with Dr.Miller at 4:30pm (time per Dr.Miller). Left message at number given [phone] that appointment is scheduled for tomorrow with Dr.Miller at 4:00pm. Advised to call back if any further questions before appointment tomorrow.  Routing to provider for final review. Patient agreeable to disposition. Will close encounter

## 2013-12-28 NOTE — Addendum Note (Signed)
Addended by: Elveria Rising on: 12/28/2013 03:51 PM   Modules accepted: Orders

## 2013-12-28 NOTE — Telephone Encounter (Signed)
Nurse will call patient

## 2013-12-29 ENCOUNTER — Other Ambulatory Visit: Payer: BC Managed Care – PPO

## 2013-12-29 ENCOUNTER — Ambulatory Visit (INDEPENDENT_AMBULATORY_CARE_PROVIDER_SITE_OTHER): Payer: BC Managed Care – PPO | Admitting: Obstetrics & Gynecology

## 2013-12-29 ENCOUNTER — Ambulatory Visit (INDEPENDENT_AMBULATORY_CARE_PROVIDER_SITE_OTHER): Payer: BC Managed Care – PPO

## 2013-12-29 ENCOUNTER — Telehealth: Payer: Self-pay | Admitting: Oncology

## 2013-12-29 ENCOUNTER — Other Ambulatory Visit: Payer: Self-pay | Admitting: Obstetrics & Gynecology

## 2013-12-29 VITALS — BP 96/62

## 2013-12-29 DIAGNOSIS — N950 Postmenopausal bleeding: Secondary | ICD-10-CM

## 2013-12-29 DIAGNOSIS — N840 Polyp of corpus uteri: Secondary | ICD-10-CM

## 2013-12-29 LAB — FOLLICLE STIMULATING HORMONE: FSH: 31.2 m[IU]/mL

## 2013-12-29 NOTE — Telephone Encounter (Signed)
s.w pt and advised on Dec appt......pt ok and aware °

## 2013-12-29 NOTE — Progress Notes (Addendum)
53 y.o.Marriedfemale here for a pelvic ultrasound with sonohystogram due to PMP bleeding.  Saw Dr. Charlies Constable yesterday.  Endometrial biopsy was obtained.  Pt with hx of breast cancer 10/13.  Completed with therapy.  Very anxious about bleeding and risk for new cancer.  Pt did take Tamoxifen post treatment for only a few days.  She is aware there is an increased endometrial cancer risk due to Tamoxifen use but as she is not actively taking it, discussed with pt this risk is only with active Tamoxifen use.  No LMP recorded. Patient is not currently having periods (Reason: Perimenopausal).  Contraception: BTL  Technique:  Both transabdominal and transvaginal ultrasound examinations of the pelvis were performed. Transabdominal technique was performed for global imaging of the pelvis including uterus, ovaries, adnexal regions, and pelvic cul-de-sac.  It was necessary to proceed with endovaginal exam following the abdominal ultrasound transabdominal exam to visualize the endometrium and adnexa.  Color and duplex Doppler ultrasound was utilized to evaluate blood flow to the ovaries.    FINDINGS: Uterus: 8.7 x 4.3 x 5.8cm Endometrium: 14.93mm Adnexa:  Left: 2.4 x 1.9 x 2.8cm     Right:  2.1 x 1.6 x 1.2cm Cul de sac: no free fluid  SHSG:  After obtaining appropriate verbal consent from patient, the cervix was visualized using a speculum, and prepped with betadine.  A tenaculum  was applied to the cervix.  Dilation of the cervix was necessary. The catheter was passed into the uterus and sterile saline introduced, with the following findings: 15 x 50mm probable endometrial polyp  After procedure was completed, pt and her husband reviewed images with me.  Labs from prior day show [facility] that is elevated--31.2.  Although this level doesn't mean she won't ever have any bleeding again, it does mean that she has very poor ovarian reserve and is close to "full" menopause.  With her history, I would recommend  hysteroscopic polyp resection.  Procedure reviewed with pt and spouse.  Risks discussed including but not limited to bleeding, rare risk of transfusion, infection, 1% risk of uterine perforation with risks of fluid deficit causing cardiac arrythmia, cerebral swelling and/or need to stop procedure early.  Fluid emboli and rare risk of death discussed.  DVT/PE, rare risk of risk of bowel/bladder/ureteral/vascular injury.  Patient aware if pathology abnormal she may need additional treatment.  She is also aware, however, that almost all pathology regarding polyps is benign.    Pt took notes throughout all of encounter.  She and husband seem to have already made their decision about proceeding.  All questions answered.   Assessment: PMP bleeding Endometrial polyp H/O ER/PR + breast cancer 10/13  Plan:  Await endometrial biopsy from yesterday.  Will most likely proceed with hysteroscopy with polyp resection with D&C but will notify pt of results first.  ~60 minutes spent with patient and spouse>50% of time was in face to face discussion of above.

## 2013-12-30 ENCOUNTER — Ambulatory Visit (INDEPENDENT_AMBULATORY_CARE_PROVIDER_SITE_OTHER): Payer: BC Managed Care – PPO | Admitting: Psychiatry

## 2013-12-30 ENCOUNTER — Encounter (HOSPITAL_COMMUNITY): Payer: Self-pay | Admitting: Psychiatry

## 2013-12-30 ENCOUNTER — Telehealth: Payer: Self-pay

## 2013-12-30 DIAGNOSIS — F4310 Post-traumatic stress disorder, unspecified: Secondary | ICD-10-CM

## 2013-12-30 DIAGNOSIS — F39 Unspecified mood [affective] disorder: Secondary | ICD-10-CM

## 2013-12-30 LAB — IPS OTHER TISSUE BIOPSY

## 2013-12-30 NOTE — Telephone Encounter (Signed)
Per Dr Sabra Heck, informed patient that her biopsy was normal and that we will plan to remove her polyp. States that she is not 100% sure that she wants to have another surgery right now. Would like to think about it and discuss it. Aware I will give Dr Sabra Heck this information and then will contact her next week.
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
## 2014-01-05 NOTE — Telephone Encounter (Signed)
Left message to call Artha Stavros at [phone]. 

## 2014-01-05 NOTE — Telephone Encounter (Signed)
Message copied by Jasmine Awe on Thu Jan 05, 2014 11:19 AM ------      Message from: Elveria Rising      Created: Fri Dec 30, 2013  7:52 AM       Inform [facility] is not menopausal and biopsy pending, but suspect she ovulated ------

## 2014-01-07 ENCOUNTER — Encounter: Payer: Self-pay | Admitting: Obstetrics & Gynecology

## 2014-01-07 DIAGNOSIS — N840 Polyp of corpus uteri: Secondary | ICD-10-CM | POA: Insufficient documentation

## 2014-02-15 NOTE — Telephone Encounter (Signed)
Pt notified of biopsy results by Dr. Sabra Heck per 01/07/14 result note.  Closing encounter.

## 2014-03-07 ENCOUNTER — Telehealth: Payer: Self-pay | Admitting: Obstetrics & Gynecology

## 2014-03-07 NOTE — Telephone Encounter (Signed)
Pt calling with questions about for Dr Sabra Heck regarding surgery.

## 2014-03-08 NOTE — Telephone Encounter (Signed)
Call to patient and notified Dr Sabra Heck is checking with Dr Migdalia Dk to see if these procedures can be done together. We will call her back.

## 2014-03-08 NOTE — Telephone Encounter (Signed)
Patient discussed hysteroscopy with polyp resection with D&C with Dr. Sabra Heck at office visit on 12/29/13.  Message left to return call to [place] at [phone].

## 2014-03-08 NOTE — Telephone Encounter (Signed)
I started with getting Kelly [name] to precert this. Precert complete so I will update patient and let her know we will be back in touch.

## 2014-03-08 NOTE — Telephone Encounter (Signed)
Patient came into office.  Requests to schedule her procedure as soon as possible. She states her schedule is open and can take any day.  Also, states that she has breast cancer reconstruction surgery that she is planning with Dr. Migdalia Dk and tummy tuck as well and wondering if can be scheduled together. I advised that I would send her request to surgery nurse to discuss with her.   Patient states she has multiple phones she can be reached on: Mobile [phone] [facility] [phone] [facility] [phone]  Routing to [facility] cc Dr. Sabra Heck.

## 2014-03-08 NOTE — Telephone Encounter (Signed)
Just FYI--sent Dr. Migdalia Dk a message.  She is one of the docs in my leadership program.  My feeling is that Dr. Migdalia Dk will not want to do these together but will wait to hear from her.  You can let pt know I've reached out to Dr. Migdalia Dk to have her question answered.  Will schedule after I hear back from Dr. Migdalia Dk.  CC:  Lamont Snowball

## 2014-03-14 NOTE — Telephone Encounter (Signed)
Message copied by Jaymes Graff on Tue Mar 14, 2014  6:33 PM ------      Message from: Megan Salon      Created: Mon Mar 13, 2014  7:33 AM      Regarding: hysteroscopy       Pt wants to have her hysteroscopy and breast procedure done at the same time.  Dr. Migdalia Dk and I have communicated.  Her office is going to call Mrs. Rothschild so she can re-examine mrs. Steffek and be sure we can do both together.  Can you let pt know this?  Thanks.            MSM ------

## 2014-03-14 NOTE — Telephone Encounter (Signed)
Call to patient's number, no VM set up. Call to son's cell number, LMTCB. Call to husband's cell, spoke to husband, LMTCB to Dr Ammie Ferrier office, no emergency.

## 2014-03-16 NOTE — Telephone Encounter (Signed)
Call to patient, sons cell number, LMTCB.

## 2014-03-17 NOTE — Telephone Encounter (Signed)
Call to patient, given message from Dr Sabra Heck. She has talked with Dr Migdalia Dk who wants to re-examine her and then determine if we can combine surgery. Patient states she has an appointment with Dr Migdalia Dk later today. She will call me back with update once she sees Dr Migdalia Dk.  Encounter closed.

## 2014-03-17 NOTE — Telephone Encounter (Signed)
Pt returning call

## 2014-03-20 ENCOUNTER — Telehealth (HOSPITAL_COMMUNITY): Payer: Self-pay | Admitting: *Deleted

## 2014-03-20 NOTE — Telephone Encounter (Signed)
Spoke to pt's outpt psychiatrist to clarify pt's depression history for [place] approval. MD reported that the pt has been depressed at various levels for several years -- she becomes severely depressed occasionally, but generally functions "around 50-60%." This helped to clarify documentation re: the onset of the pt's current episode of depression.

## 2014-03-28 ENCOUNTER — Ambulatory Visit (HOSPITAL_COMMUNITY)
Admission: RE | Admit: 2014-03-28 | Discharge: 2014-03-28 | Disposition: A | Discharge status: Home / Self Care | Payer: BC Managed Care – PPO | Source: Ambulatory Visit | Attending: Oncology | Admitting: Oncology

## 2014-03-28 ENCOUNTER — Other Ambulatory Visit: Payer: Self-pay | Admitting: *Deleted

## 2014-03-28 DIAGNOSIS — Z978 Presence of other specified devices: Secondary | ICD-10-CM

## 2014-03-28 DIAGNOSIS — C50911 Malignant neoplasm of unspecified site of right female breast: Secondary | ICD-10-CM

## 2014-03-28 DIAGNOSIS — C50919 Malignant neoplasm of unspecified site of unspecified female breast: Secondary | ICD-10-CM | POA: Insufficient documentation

## 2014-03-29 ENCOUNTER — Telehealth: Payer: Self-pay

## 2014-03-29 ENCOUNTER — Encounter: Payer: Self-pay | Admitting: Psychiatry

## 2014-03-29 NOTE — Telephone Encounter (Signed)
Pt called in on 9/1 c/o fullness and heaviness in her upper chest up to her neck.  Her symptoms are fairly consistent and not aggravated or excaberated with activity.  This is causing her anxiety.  Her PCP put her on xanax.    A CXR was ordered by Dr. Jana Hakim, performed on 9/1.    The patient arrived in the clinic walk in on 9/2 at 2:15.  She came in to get the results of the cxr.  Dr. Jana Hakim instructed RN to notify pt that her CXR was ok and the most likely the heaviness is due to surgery, implants, etc.    Pt stated she had no implants in place, that she has had pain for 3 months and the xanax does not work.  Pt was unable to stay for resolution of the problem d/t children.    Followed up with patient and went through issue with her.  I clarified that the xanax would not work for pain.  I also read the results of her CXR to her.  I let pt know the results say that there are no cardio or pulmonary problems.  I also explained to her that the amount of surgical work and change she has had in her chest will result in scarring, tightness of her skin, etc.  I let pt know that Dr. Jana Hakim does not believe her symptoms are being caused by an internal problem and that at this point the only thing medical oncology would be able to do towards the issue is pain medication.  Recommended that patient call her surgeon for evaluation.  Pt does not want pain medication.  States she is waiting on Dr. Leafy Ro scheduling dept now for some "additional work" that needs to be done.  Offered to call Dr. Leafy Ro nurse to discuss issue.  Pt declined.  Stated she would speak to them herself when they call her to schedule.  Let pt know to contact us if she needs [place] to call Dr. Migdalia Dk for her.  Pt voiced understanding.    Notified Dr. Jana Hakim of status.

## 2014-03-29 NOTE — Progress Notes (Signed)
Patient ID: Kelly [name], female   DOB: 07/04/1961, 53 y.o.   MRN: [id number]  Kelly [name] now meets criteria for [place], as her PHQ9 score is 24, which is in the severe category; in addition,  the conversation from the historical provider, saying patient has oscillations in her depression.

## 2014-04-06 ENCOUNTER — Telehealth: Payer: Self-pay | Admitting: Obstetrics & Gynecology

## 2014-04-06 NOTE — Telephone Encounter (Signed)
Return call to patient, desires to proceed with scheduling at first available date after her 6 week recovery from breast surgery. Discussed 06-05-14 and patient agreeable, anxious to get everything scheduled and in place so no obstacles. Advised will review with MD to confirm and call her beginning of week once scheduled.  Last seen 12-29-13 for PUS. Proceed with scheduling Hysteroscopy/Polyp resection/D&C?

## 2014-04-06 NOTE — Telephone Encounter (Signed)
Pt wants to schedule surgery for a polyp. She is having surgery on her breast on 04/19/14 and will need to heal before having another surgery. Please call patient today she wants to discuss this with the nurse.

## 2014-04-07 ENCOUNTER — Telehealth (HOSPITAL_COMMUNITY): Payer: Self-pay | Admitting: *Deleted

## 2014-04-07 NOTE — Telephone Encounter (Signed)
Called to inform pt of denial for precertification for Transcranial Magnetic Stimulation for MDD. Pt unhappy about denial. Informed pt that she should receive a denial letter from [facility] in the mail. Also informed pt that this clinic would be filing an appeal on her behalf. Pt expressed desperation for TMS tx, as she feels she has no options at this point in her treatment. Writer instructed pt to reported to nearest ED or call 911 in the event of SI. Pt said she would and contracted for safety.

## 2014-04-07 NOTE — Telephone Encounter (Signed)
Patient calling wanting to move surgery one week ahead to 06/12/14, if possible.

## 2014-04-07 NOTE — Telephone Encounter (Signed)
Contacted [employer] case Patent examiner for update re: precertification for Transcranial Magnetic Stimulation therapy for pt's MDD. Roselyn Reef reported that the physician assigned to the case denied the request on the basis of lack of medical necessity. Physician's rationale: previous trial of Cymbalta not at adequate dose, pt not currently receiving psychotherapy, and pt seems Bipolar to the reviewing physician (as opposed to MDD). Appeal information was provided and listed below: Phone: [phone] Fax: [phone]

## 2014-04-07 NOTE — Telephone Encounter (Signed)
Gay Filler, I left a message for her breast surgeon who said it was possible to do these together but she was going to see mrs. Hickox first.  Can you clarify with Mrs. Doffing about this.  Thanks.  If surgeon said wait, ok to go ahead and schedule 6 weeks post-breast surgery.

## 2014-04-07 NOTE — Telephone Encounter (Signed)
Return call to patient, Voice mail not set up. Unable to leave message.  Case request for 06-12-14 at 1000 sent.

## 2014-04-19 ENCOUNTER — Other Ambulatory Visit: Payer: Self-pay

## 2014-04-19 HISTORY — PX: COSMETIC SURGERY: SHX468

## 2014-05-08 ENCOUNTER — Telehealth: Payer: Self-pay | Admitting: *Deleted

## 2014-05-08 NOTE — Telephone Encounter (Signed)
Surgery date of 06-12-14 at 1030 at [facility] confirmed with patient. Surgery instruction sheet reviewed and printed copy mailed.Surgical appointments scheduled and questions answered. Call PRN.  Routing to provider for final review. Patient agreeable to disposition. Will close encounter

## 2014-05-08 NOTE — Telephone Encounter (Signed)
See next phone note.  Routing to provider for final review. Patient agreeable to disposition. Will close encounter.    

## 2014-05-11 ENCOUNTER — Ambulatory Visit (INDEPENDENT_AMBULATORY_CARE_PROVIDER_SITE_OTHER): Payer: BC Managed Care – PPO | Admitting: Psychiatry

## 2014-05-11 VITALS — BP 114/65 | HR 82 | Ht 64.0 in | Wt 117.0 lb

## 2014-05-11 DIAGNOSIS — F332 Major depressive disorder, recurrent severe without psychotic features: Secondary | ICD-10-CM

## 2014-05-11 NOTE — Progress Notes (Signed)
Patient ID: Kelly [name], female   DOB: 1960-11-01, 53 y.o.   MRN: [id number] Pt reported to [facility] for cortical mapping for Repetitve Transcranial Magnetic Stimulation treatment for Major Depressive Disorder. Pt also received her first session of treatment. Pt completed a PHQ-9 with a score of 24 (severe depression). Pt completed a Beck's Depression Inventory with a score of 46 (extreme depression). Pt signed Informed Consent Agreement prior to the outset of cortical mapping and treatment. Pt reported no change in medication regimen, alcohol/substance use, caffeine consumption, sleep pattern, or metal implant status since previous visit. Pt's treatment location and motor threshold were calculated. The following parameters were found: Tx AP -- 9.5 cm, SOA -- 40 degrees, Coil Angle -- 0 degrees, Motor Threshold -- 1.46 SMT. Pt tolerated tx well. Tx began at 80 %MT and was gradually tapered up to 90 %MT during the course of the session. Pt appeared to be somewhat sedated during tx session. Pt also became mildly tearful several times throughout tx. Pt denied the use of alcohol or benzodiazepines prior to tx session. Pt with no complaints post-tx. Pt departed from clinic without issue.

## 2014-05-12 ENCOUNTER — Other Ambulatory Visit (HOSPITAL_COMMUNITY): Payer: BC Managed Care – PPO | Attending: Psychiatry | Admitting: *Deleted

## 2014-05-12 VITALS — BP 111/73 | HR 75

## 2014-05-12 DIAGNOSIS — F332 Major depressive disorder, recurrent severe without psychotic features: Secondary | ICD-10-CM

## 2014-05-12 DIAGNOSIS — F329 Major depressive disorder, single episode, unspecified: Secondary | ICD-10-CM | POA: Diagnosis not present

## 2014-05-12 NOTE — Progress Notes (Signed)
Patient ID: Kelly [name], female   DOB: 04-17-1961, 53 y.o.   MRN: [id number] Pt reported to [facility] for Repetitve Transcranial Magnetic Stimulation treatment for Major Depressive Disorder. Pt reported no change in alcohol/substance use, caffeine consumption, or metal implant status since previous tx. Pt reported that she did not go to sleep until 3:00am the previous night due to transporting a friend to a weekend retreat out of town. Pt also reported that she took one Xanax 1 mg this morning for anxiety, as "I was anxious because I had to get up at 6:00 and have two children at school by 8:00. Also my son piled up laundry in my room, and messes make me anxious." Pt presented with bright affect for treatment. Pt became tearful several times during the course of the treatment session. Pt reported that on Mother's Day (12/04/2013) she had a conversation with a family member which hurt her deeply, and in response to this, "I made a cot, took five Trazodone, and went to sleep not knowing if I would wake up. I wrote a note to my loved ones and put it under the pillow in case I died." Pt characterized these events as an active attempt to commit suicide. Writer asked if pt was feeling suicidal now, or if she had been feeling suicidal recently. Pt denied recent suicidal ideation. Pt stated that she no longer is in communication with anyone outside her immediate family, with the exception of one aunt. Writer provide pt with Suicide Crisis Hotline resources to call in the event she begins experiencing suicidal ideation. Writer also instructed pt to call 911 or report to the nearest ED in the event she experiences SI with plan/intent, or if she no longer feels safe with herself. Pt contracted for safety. Writer utilized [employer] with pt, requesting that the pt verbalize her plan if she begins to experience SI. Pt stated she would call one of the Suicide Hotline numbers provided by the  writer, or call 911 or report to the ED. Pt tolerated tx well. %MT remained at was titrated up to 90%. Will attempt to titrate up to 100% during next tx session. Pt with no complaints post-tx. Pt departed from clinic without issue.

## 2014-05-15 ENCOUNTER — Other Ambulatory Visit (INDEPENDENT_AMBULATORY_CARE_PROVIDER_SITE_OTHER): Payer: BC Managed Care – PPO | Admitting: *Deleted

## 2014-05-15 VITALS — BP 114/66 | HR 73

## 2014-05-15 DIAGNOSIS — F329 Major depressive disorder, single episode, unspecified: Secondary | ICD-10-CM | POA: Diagnosis not present

## 2014-05-15 DIAGNOSIS — F332 Major depressive disorder, recurrent severe without psychotic features: Secondary | ICD-10-CM

## 2014-05-15 NOTE — Progress Notes (Signed)
Patient ID: MERLYN BOLLEN, female   DOB: [birth year], 53 y.o.   MRN: [id number] Pt reported to [facility] for Repetitve Transcranial Magnetic Stimulation treatment for Major Depressive Disorder. Pt reported no change in medication regimen, alcohol/substance use, caffeine consumption, or metal implant status since previous tx. Pt stated she had a very busy weekend which affected her sleep pattern. Pt stated she went to bed later than normal on Friday night and got about 7 hours of sleep, but felt it was not quality sleep. Pt reported she got about 5 hours of sleep total Saturday night and about 7 hours last night. Pt reported she normally sleeps 7-8 hours per night. Pt tolerated tx well. Interval increased from 26 seconds to 28 seconds at the outset of tx to accommodate increased %MT, as the pt's MT is much higher than most pts. %MT titrated up to 110% without issue. Will attempt to titrate up to 120% at next tx session. Pt with no complaints post-tx. Pt departed from clinic without issue.

## 2014-05-16 ENCOUNTER — Other Ambulatory Visit (INDEPENDENT_AMBULATORY_CARE_PROVIDER_SITE_OTHER): Payer: BC Managed Care – PPO | Admitting: *Deleted

## 2014-05-16 VITALS — BP 116/69 | HR 90

## 2014-05-16 DIAGNOSIS — F332 Major depressive disorder, recurrent severe without psychotic features: Secondary | ICD-10-CM

## 2014-05-16 DIAGNOSIS — F329 Major depressive disorder, single episode, unspecified: Secondary | ICD-10-CM | POA: Diagnosis not present

## 2014-05-16 NOTE — Progress Notes (Signed)
Patient ID: Kelly [name], female   DOB: 04/27/1961, 53 y.o.   MRN: [id number] Pt reported to [facility] for Repetitve Transcranial Magnetic Stimulation treatment for Major Depressive Disorder. Pt reported no change in medication regimen, alcohol/substance use, caffeine consumption, or metal implant status since previous tx. Pt announced "I slept much better last night." Pt tolerated tx well. Interval increased to 30 seconds prior to the start of tx to accommodate the increase in %MT. %MT titrated up to 115%. Attempted to titrate up to 120%, but [employer] would not allow this as this would exceed built in parameters due to pt's MT being unusually high. Will increase interval further for next tx session in order to increase %MT to 120%. Pt with no complaints post-tx. Pt departed from clinic without issue.

## 2014-05-17 ENCOUNTER — Other Ambulatory Visit (INDEPENDENT_AMBULATORY_CARE_PROVIDER_SITE_OTHER): Payer: BC Managed Care – PPO | Admitting: *Deleted

## 2014-05-17 VITALS — BP 120/59 | HR 90 | Ht 64.0 in | Wt 116.2 lb

## 2014-05-17 DIAGNOSIS — F329 Major depressive disorder, single episode, unspecified: Secondary | ICD-10-CM | POA: Diagnosis not present

## 2014-05-17 DIAGNOSIS — F332 Major depressive disorder, recurrent severe without psychotic features: Secondary | ICD-10-CM

## 2014-05-17 NOTE — Progress Notes (Signed)
Patient ID: Kelly [name], female   DOB: [birth year], 53 y.o.   MRN: [id number] Pt reported to [facility] for Repetitve Transcranial Magnetic Stimulation treatment for Major Depressive Disorder. Pt reported no change in medication regimen, alcohol/substance use, caffeine consumption, sleep pattern, or metal implant status since previous tx. Pt completed a PHQ-9 with a score of 17 (moderately severe depression). This is a decrease from the previous week's score of 24 (severe depression). Pt reported that she has perceived a slight elevation in her mood since beginning [place] tx last week. Pt stated she has also experienced a decrease in her energy level, stating that has felt completely drained by the end of the day the past few days. Pt tolerated tx well. Interval increased to 32 seconds to accommodate the final increase in %MT. %MT titrated up to 120% where it remained for the rest of tx. %MT will remain at 120% for the remainder of the pt's course of treatment, except in the case of needing to decrease it for pt comfort. Pt with no complaints post-tx. Pt departed from clinic without issue.

## 2014-05-18 ENCOUNTER — Other Ambulatory Visit (INDEPENDENT_AMBULATORY_CARE_PROVIDER_SITE_OTHER): Payer: BC Managed Care – PPO | Admitting: *Deleted

## 2014-05-18 VITALS — BP 140/74 | HR 100

## 2014-05-18 DIAGNOSIS — F329 Major depressive disorder, single episode, unspecified: Secondary | ICD-10-CM | POA: Diagnosis not present

## 2014-05-18 DIAGNOSIS — F332 Major depressive disorder, recurrent severe without psychotic features: Secondary | ICD-10-CM

## 2014-05-18 NOTE — Progress Notes (Signed)
Patient ID: Kelly [name], female   DOB: Nov 12, 1960, 53 y.o.   MRN: [id number] Pt reported to [facility] for Repetitve Transcranial Magnetic Stimulation treatment for Major Depressive Disorder. Pt stated that she noticed the previous evening that she felt "stronger" emotionally. Pt stated "things that normally would have pulled me way down didn't have that effect on me yesterday after treatment." Pt attributes this progress to [place] tx. Pt tolerated tx well. Interval remained at 32 seconds to accommodate pt's high motor threshold. Interval will remain at 32 seconds for the remainder of pt's course of [place] tx, unless MT is recalculated and determined to be lower that it presently is, in which case the interval may be decreased. %MT remained at 120% for the duration of tx. Pt with no complaints post-tx. Pt departed from clinic without issue.

## 2014-05-19 ENCOUNTER — Other Ambulatory Visit (INDEPENDENT_AMBULATORY_CARE_PROVIDER_SITE_OTHER): Payer: BC Managed Care – PPO | Admitting: *Deleted

## 2014-05-19 VITALS — BP 103/66 | HR 75

## 2014-05-19 DIAGNOSIS — F332 Major depressive disorder, recurrent severe without psychotic features: Secondary | ICD-10-CM | POA: Diagnosis not present

## 2014-05-19 DIAGNOSIS — F329 Major depressive disorder, single episode, unspecified: Secondary | ICD-10-CM | POA: Diagnosis not present

## 2014-05-19 NOTE — Progress Notes (Signed)
Patient ID: Kelly [name], female   DOB: 1960-12-11, 53 y.o.   MRN: [id number] Pt reported to [facility] for Repetitve Transcranial Magnetic Stimulation treatment for Major Depressive Disorder. Pt reported no change in medication regimen, alcohol/substance use, caffeine consumption, sleep pattern, or metal implant status since previous tx. Pt tolerated tx well. %MT remained at 120% for the duration of tx. Pt with no complaints post-tx. Pt departed from clinic without issue.

## 2014-05-22 ENCOUNTER — Other Ambulatory Visit (INDEPENDENT_AMBULATORY_CARE_PROVIDER_SITE_OTHER): Payer: BC Managed Care – PPO | Admitting: *Deleted

## 2014-05-22 VITALS — BP 136/84 | HR 86

## 2014-05-22 DIAGNOSIS — F332 Major depressive disorder, recurrent severe without psychotic features: Secondary | ICD-10-CM

## 2014-05-22 DIAGNOSIS — F329 Major depressive disorder, single episode, unspecified: Secondary | ICD-10-CM | POA: Diagnosis not present

## 2014-05-22 NOTE — Progress Notes (Signed)
Patient ID: Kelly [name], female   DOB: 01/01/1961, 53 y.o.   MRN: [id number] Pt reported to [facility] for Repetitve Transcranial Magnetic Stimulation treatment for Major Depressive Disorder. Pt reported that she had a stressful afternoon. Pt rescheduled appointment today. Original appointment time was 1:30pm. Pt called at 1:40pm to reschedule for 4:00 pm. Pt reported no change in medication regimen, alcohol/substance use, caffeine consumption, or metal implant status since previous tx. Pt reported "I only slept an hour or two last night." Pt stated this was due to anxiety resultant from "it being close to the holidays" which exascerbates feelings of sadness from loosing her mother this year. Pt also stated she was in [facility] yesterday, which is where much of her family lives who she does not get along with, which also contributed to her anxiety last night. Pt endorsed anxiety when she arrived to clinic. Pt reported feeling tightness in chest which she normally experiences as a result of anxiety. Pt utilized deep breathing exercises for approximately 10 minutes after tx began. Pt verbalized that the tightness in her chest had begun to decrease as a result of this. Pt tolerated tx well. %MT remained at 120% for the duration of tx. Pt with no complaints post-tx. Pt departed from clinic without issue.

## 2014-05-23 ENCOUNTER — Other Ambulatory Visit (INDEPENDENT_AMBULATORY_CARE_PROVIDER_SITE_OTHER): Payer: BC Managed Care – PPO

## 2014-05-23 DIAGNOSIS — F329 Major depressive disorder, single episode, unspecified: Secondary | ICD-10-CM | POA: Diagnosis not present

## 2014-05-23 DIAGNOSIS — F332 Major depressive disorder, recurrent severe without psychotic features: Secondary | ICD-10-CM

## 2014-05-24 ENCOUNTER — Other Ambulatory Visit (INDEPENDENT_AMBULATORY_CARE_PROVIDER_SITE_OTHER): Payer: BC Managed Care – PPO | Admitting: *Deleted

## 2014-05-24 VITALS — BP 108/79 | HR 86 | Ht 64.0 in | Wt 114.8 lb

## 2014-05-24 DIAGNOSIS — F329 Major depressive disorder, single episode, unspecified: Secondary | ICD-10-CM | POA: Diagnosis not present

## 2014-05-24 DIAGNOSIS — F332 Major depressive disorder, recurrent severe without psychotic features: Secondary | ICD-10-CM

## 2014-05-24 NOTE — Progress Notes (Signed)
Patient ID: Kelly [name], female   DOB: 1960-12-07, 53 y.o.   MRN: [id number] Pt reported to [facility] for Repetitve Transcranial Magnetic Stimulation treatment for Major Depressive Disorder. Pt reported no change in medication regimen, alcohol/substance use, caffeine consumption, sleep pattern, or metal implant status since previous tx. Pt completed a PHQ-9 with a score of 11 (moderate depression). This is decreased from last week's score of 17 (moderately severe depression), as well as the pt's baseline score of 24 (severe depression). Pt's PHQ-9 score has decreased by 54% from baseline, meaning that she already meets criteria for clinical response to [place] tx. Continued tx is warranted in order to attempt to continue her benefit on to remission from her depressive symptoms. Pt tolerated tx well. %MT remained at 120% for the duration of tx. Pt with no complaints post-tx. Pt departed from clinic without issue.

## 2014-05-25 ENCOUNTER — Other Ambulatory Visit (INDEPENDENT_AMBULATORY_CARE_PROVIDER_SITE_OTHER): Payer: BC Managed Care – PPO | Admitting: *Deleted

## 2014-05-25 VITALS — BP 125/65 | HR 80

## 2014-05-25 DIAGNOSIS — F329 Major depressive disorder, single episode, unspecified: Secondary | ICD-10-CM | POA: Diagnosis not present

## 2014-05-25 DIAGNOSIS — F332 Major depressive disorder, recurrent severe without psychotic features: Secondary | ICD-10-CM

## 2014-05-25 NOTE — Progress Notes (Signed)
Patient ID: Kelly [name], female   DOB: Dec 19, 1960, 53 y.o.   MRN: [id number] Pt reported to [facility] for Repetitve Transcranial Magnetic Stimulation treatment for Major Depressive Disorder. Pt reported no change in medication regimen, alcohol/substance use, caffeine consumption, sleep pattern, or metal implant status since previous tx. Pt tolerated tx well. %MT remained at 120% for the duration of tx. Pt with no complaints post-tx. Pt departed from clinic without issue.

## 2014-05-26 ENCOUNTER — Other Ambulatory Visit (INDEPENDENT_AMBULATORY_CARE_PROVIDER_SITE_OTHER): Payer: BC Managed Care – PPO | Admitting: *Deleted

## 2014-05-26 VITALS — BP 105/56 | HR 83

## 2014-05-26 DIAGNOSIS — F329 Major depressive disorder, single episode, unspecified: Secondary | ICD-10-CM | POA: Diagnosis not present

## 2014-05-26 DIAGNOSIS — F332 Major depressive disorder, recurrent severe without psychotic features: Secondary | ICD-10-CM

## 2014-05-26 NOTE — Progress Notes (Signed)
Patient ID: Kelly [name], female   DOB: Apr 18, 1961, 53 y.o.   MRN: [id number] Pt reported to [facility] for Repetitve Transcranial Magnetic Stimulation treatment for Major Depressive Disorder. Pt reported no change in medication regimen, alcohol/substance use, caffeine consumption, sleep pattern, or metal implant status since previous tx. Pt interested in referral for psychotherapy. Pt specifically requesting female faith-based therapist. Will continue to explore this with the pt to make best referral. Pt tolerated tx well. %MT remained at 120% for the duration of tx. Pt with no complaints post-tx. Pt departed from clinic without issue.

## 2014-05-29 ENCOUNTER — Encounter: Payer: Self-pay | Admitting: Obstetrics & Gynecology

## 2014-05-29 ENCOUNTER — Other Ambulatory Visit (HOSPITAL_COMMUNITY): Payer: BC Managed Care – PPO | Attending: Psychiatry | Admitting: *Deleted

## 2014-05-29 VITALS — BP 115/70 | HR 68

## 2014-05-29 DIAGNOSIS — F329 Major depressive disorder, single episode, unspecified: Secondary | ICD-10-CM | POA: Diagnosis not present

## 2014-05-29 DIAGNOSIS — F332 Major depressive disorder, recurrent severe without psychotic features: Secondary | ICD-10-CM

## 2014-05-29 NOTE — Progress Notes (Signed)
Patient ID: Kelly [name], female   DOB: March 12, 1961, 53 y.o.   MRN: [id number] Pt reported to [facility] for Repetitve Transcranial Magnetic Stimulation treatment for Major Depressive Disorder. Pt reported no change in medication regimen, alcohol/substance use, caffeine consumption, or metal implant status since previous tx. Pt stated she did not sleep well Saturday night, so she took a 4 hour nap Sunday afternoon. Pt displaying bright affect along with euthymic mood today. Pt carried on conversation with Probation officer for duration of tx session. Pt tolerated tx well. %MT remained at 120% for the duration of tx. Pt with no complaints post-tx. Pt departed from clinic without issue.

## 2014-05-30 ENCOUNTER — Other Ambulatory Visit (INDEPENDENT_AMBULATORY_CARE_PROVIDER_SITE_OTHER): Payer: BC Managed Care – PPO | Admitting: *Deleted

## 2014-05-30 VITALS — BP 117/65 | HR 76

## 2014-05-30 DIAGNOSIS — F332 Major depressive disorder, recurrent severe without psychotic features: Secondary | ICD-10-CM

## 2014-05-30 DIAGNOSIS — F329 Major depressive disorder, single episode, unspecified: Secondary | ICD-10-CM | POA: Diagnosis not present

## 2014-05-30 NOTE — Progress Notes (Signed)
Patient ID: Kelly [name], female   DOB: Aug 27, 1960, 53 y.o.   MRN: [id number] Pt reported to [facility] for Repetitve Transcranial Magnetic Stimulation treatment for Major Depressive Disorder. Pt reported no change in medication regimen, alcohol/substance use, caffeine consumption, sleep pattern, or metal implant status since previous tx. Pt stated that she has historically experienced heaviness and pain in her chest and neck, especially since the passing of her mother earlier this year. Pt stated she saw her PCP re: this issue, who did blood work and "found no problem with my heart." Pt states this probably is resultant from anxiety. Pt reported that she has been experiencing these symptoms less in the last 24 hours, although she rates current pain in her chest at 1-2 out of 10. Pt attributes the decrease in these symptoms to "I listened to a frequency of 528 hertz on my phone last night, which decreases stress. I hadn't done this in a long time, but it really helped me last night." Pt has an appointment on Friday with her PCP for follow up blood work to continue investigating this issue. Writer rechecked pt's vitals after pt disclosed mild chest pain -- BP: 117/65, Pulse: 76. Pt tolerated tx well. %MT remained at 120% for the duration of tx. Pt with no complaints post-tx. Pt departed from clinic without issue.

## 2014-05-31 ENCOUNTER — Other Ambulatory Visit (INDEPENDENT_AMBULATORY_CARE_PROVIDER_SITE_OTHER): Payer: BC Managed Care – PPO | Admitting: *Deleted

## 2014-05-31 VITALS — BP 122/66 | HR 76

## 2014-05-31 DIAGNOSIS — F329 Major depressive disorder, single episode, unspecified: Secondary | ICD-10-CM | POA: Diagnosis not present

## 2014-05-31 DIAGNOSIS — F332 Major depressive disorder, recurrent severe without psychotic features: Secondary | ICD-10-CM

## 2014-05-31 NOTE — Patient Instructions (Signed)
Your procedure is scheduled on:  Monday, Jun 12, 2014  Enter through the Main Entrance of [facility] at:  8:30 A.M.  Pick up the phone at the desk and dial [phone].  Call this number if you have problems the morning of surgery: [phone].  Remember: Do NOT eat food:  AFTER MIDNIGHT Sunday NOV. 15, 2015 Do NOT drink clear liquids after: AFTER MIDNIGHT Sunday NOV. 15, 2015 Take these medicines the morning of surgery with a SIP OF WATER: NONE  Do NOT wear jewelry (body piercing), metal hair clips/bobby pins, make-up, or nail polish. Do NOT wear lotions, powders, or perfumes.  You may wear deoderant. Do NOT shave for 48 hours prior to surgery. Do NOT bring valuables to the hospital. Contacts, dentures, or bridgework may not be worn into surgery.  Have a responsible adult drive you home and stay with you for 24 hours after your procedure

## 2014-06-01 ENCOUNTER — Encounter (HOSPITAL_COMMUNITY)
Admission: RE | Admit: 2014-06-01 | Discharge: 2014-06-01 | Disposition: A | Discharge status: Home / Self Care | Payer: BC Managed Care – PPO | Source: Ambulatory Visit | Attending: Obstetrics & Gynecology | Admitting: Obstetrics & Gynecology

## 2014-06-01 ENCOUNTER — Encounter: Payer: Self-pay | Admitting: Obstetrics & Gynecology

## 2014-06-01 ENCOUNTER — Other Ambulatory Visit (INDEPENDENT_AMBULATORY_CARE_PROVIDER_SITE_OTHER): Payer: BC Managed Care – PPO | Admitting: *Deleted

## 2014-06-01 ENCOUNTER — Encounter (HOSPITAL_COMMUNITY): Payer: Self-pay

## 2014-06-01 ENCOUNTER — Ambulatory Visit (INDEPENDENT_AMBULATORY_CARE_PROVIDER_SITE_OTHER): Payer: BC Managed Care – PPO | Admitting: Obstetrics & Gynecology

## 2014-06-01 VITALS — BP 104/62 | HR 68 | Resp 16 | Wt 115.0 lb

## 2014-06-01 VITALS — BP 111/58 | HR 80 | Ht 65.0 in

## 2014-06-01 DIAGNOSIS — Z01812 Encounter for preprocedural laboratory examination: Secondary | ICD-10-CM | POA: Insufficient documentation

## 2014-06-01 DIAGNOSIS — N950 Postmenopausal bleeding: Secondary | ICD-10-CM

## 2014-06-01 DIAGNOSIS — F329 Major depressive disorder, single episode, unspecified: Secondary | ICD-10-CM | POA: Diagnosis not present

## 2014-06-01 DIAGNOSIS — F332 Major depressive disorder, recurrent severe without psychotic features: Secondary | ICD-10-CM

## 2014-06-01 DIAGNOSIS — N840 Polyp of corpus uteri: Secondary | ICD-10-CM

## 2014-06-01 LAB — CBC
HCT: 39.3 % (ref 36.0–46.0)
Hemoglobin: 13.2 g/dL (ref 12.0–15.0)
MCH: 30.5 pg (ref 26.0–34.0)
MCHC: 33.6 g/dL (ref 30.0–36.0)
MCV: 90.8 fL (ref 78.0–100.0)
Platelets: 263 10*3/uL (ref 150–400)
RBC: 4.33 MIL/uL (ref 3.87–5.11)
RDW: 13.4 % (ref 11.5–15.5)
WBC: 5.3 10*3/uL (ref 4.0–10.5)

## 2014-06-01 NOTE — Progress Notes (Signed)
Patient ID: Kelly [name], female   DOB: [birth year], 53 y.o.   MRN: [id number] Pt reported to [facility] for Repetitve Transcranial Magnetic Stimulation treatment for Major Depressive Disorder. Pt reported no change in medication regimen, alcohol/substance use, caffeine consumption, sleep pattern, or metal implant status since previous tx. Pt completed a Beck's Depression Inventory with a score of 5. This is decreased from the pt's initial score of 46 (extreme depression). Pt tolerated tx well. %MT remained at 120% for the duration of tx. Pt with no complaints post-tx. Pt departed from clinic without issue.

## 2014-06-01 NOTE — Progress Notes (Signed)
Patient ID: Kelly [name], female   DOB: [birth year], 53 y.o.   MRN: [id number] Pt reported to [facility] for Repetitve Transcranial Magnetic Stimulation treatment for Major Depressive Disorder. Pt reported no change in medication regimen, alcohol/substance use, caffeine consumption, sleep pattern, or metal implant status since previous tx. Pt completed a PHQ-9 with a score of 10 (moderate depression). Pt tolerated tx well. %MT remained at 120% for the duration of tx. Pt with no complaints post-tx. Pt departed from clinic without issue.

## 2014-06-01 NOTE — Progress Notes (Signed)
53 y.o. [id number] MarriedCaucasian female here for discussion of upcoming procedure.  Hysteroscopy with polyp resection, D&C planned due to endometrial polyp and PMP bleeding.  Pre-op evaluation thus far has included endometrial biopsy that was done 12/28/13 showing disordered proliferative endometrium with possible hyperplasia present.  Ultrasound showed 15 x 40mm endometrial polyp.  This was done 12/29/13.  Surgery was delayed due to pt needing to complete breast reconstruction surgery.  Denies additional vaginal bleeding.  In fact, she has wondered whether procedure necessary.  Reviewed pathology again and finding of endometrial polyp.  Really feel she should proceed.  Procedure discussed with patient.  Recovery and pain management discussed.  Risks discussed including but not limited to bleeding, rare risk of transfusion, infection, 1% risk of uterine perforation with risks of fluid deficit causing cardiac arrythmia, cerebral swelling and/or need to stop procedure early.  Fluid emboli and rare risk of death discussed.  DVT/PE, rare risk of risk of bowel/bladder/ureteral/vascular injury.  Patient aware if pathology abnormal she may need additional treatment.  All questions answered.    Ob Hx:   Patient's last menstrual period was 12/29/2013.          Last pap: 10/10/13 WNL/negative HR HPV Last MMG: 05/05/12-MMG, biopsy, MRI-bilateral mastectomy Tobacco:  none  Past Surgical History  Procedure Laterality Date  . Cesarean section      x3  . Joint replacement      Rt and Lt thumbs  . Cervical conization w/bx    . Breast surgery      several biopsies, needle aspiration  . Mastectomy w/ sentinel node biopsy  06/07/2012    Procedure: MASTECTOMY WITH SENTINEL LYMPH NODE BIOPSY;  Surgeon: Haywood Lasso, MD;  Location: [place];  Service: General;  Laterality: Right;  right total mastectomy and sentinel node  . Mastectomy modified radical  06/07/2012    Procedure: MASTECTOMY MODIFIED RADICAL;  Surgeon:  Haywood Lasso, MD;  Location: [place];  Service: General;  Laterality: Right;  . Tubal ligation    . Simple mastectomy with axillary sentinel node biopsy Left 09/30/2012    Procedure: SIMPLE MASTECTOMY;  Surgeon: Haywood Lasso, MD;  Location: [place];  Service: General;  Laterality: Left;  left total mastectomy  . Latissimus flap to breast Right 06/06/2013    Procedure: RIGHT LATISSIMUS MYOCUTANEOUS FLAP WITH PLACEMENT OF TISSUE EXPANDER IN RIGHT BREAST ;  Surgeon: Theodoro Kos, DO;  Location: [place];  Service: Plastics;  Laterality: Right;  . Breast reconstruction with placement of tissue expander and flex hd (acellular hydrated dermis) Left 06/06/2013    Procedure: PLACEMENT OF LEFT BREAST TISSUE EXPANDER AND FLEX HD ;  Surgeon: Theodoro Kos, DO;  Location: [place];  Service: Plastics;  Laterality: Left;  . Removal of bilateral tissue expanders with placement of bilateral breast implants Bilateral 11/10/2013    Procedure: REMOVAL OF BILATERAL TISSUE EXPANDERS WITH PLACEMENT OF SILICONE BREAST IMPLANTS WITH BILATERAL CAPSULOTOMIES;  Surgeon: Theodoro Kos, DO;  Location: [place];  Service: Plastics;  Laterality: Bilateral;    Past Medical History  Diagnosis Date  . Anxiety   . Depression   . Breast cancer 05/05/12    right, ER +, PR -, Her 2 -  . Arthritis   . History of radiation therapy 07/14/2012-09/03/12    right chest wall 59.4 gray  . H/O dizziness   . Abnormal Pap smear of cervix     with conization  . Sleep apnea  cpap-has not been using-doing well without it    Allergies: Doxycycline  Current Outpatient Prescriptions  Medication Sig Dispense Refill  . ALPRAZolam (XANAX) 0.5 MG tablet     . amphetamine-dextroamphetamine (ADDERALL XR) 30 MG 24 hr capsule Take 30 mg by mouth daily.     . Biotin 10 MG TABS Take 1 tablet by mouth daily.    Marland Kitchen buPROPion (WELLBUTRIN XL) 150 MG 24 hr tablet Take 300 mg by mouth daily.     . Cholecalciferol  (VITAMIN D-3) 5000 UNITS TABS Take 5,000 Units by mouth daily.    . Coconut Oil 1000 MG CAPS Take by mouth.    Marland Kitchen L-Methylfolate (DEPLIN) 7.5 MG TABS Take 1 tablet by mouth daily.    . Multiple Vitamins-Minerals (MULTIVITAMIN WITH MINERALS) tablet Take 1 tablet by mouth daily.    . traZODone (DESYREL) 50 MG tablet Take 150 mg by mouth at bedtime.    Marland Kitchen VIIBRYD 40 MG TABS Take 40 mg by mouth daily.     Marland Kitchen lidocaine-prilocaine (EMLA) cream Apply 45 to 60 minutes     No current facility-administered medications for this visit.    ROS: A comprehensive review of systems was negative.  Exam:    BP 104/62 mmHg  Pulse 68  Resp 16  Wt 115 lb (52.164 kg)  LMP 12/29/2013  General appearance: alert and cooperative Head: Normocephalic, without obvious abnormality, atraumatic Neck: no adenopathy, supple, symmetrical, trachea midline and thyroid not enlarged, symmetric, no tenderness/mass/nodules Lungs: clear to auscultation bilaterally Heart: regular rate and rhythm, S1, S2 normal, no murmur, click, rub or gallop Abdomen: soft, non-tender; bowel sounds normal; no masses,  no organomegaly Extremities: extremities normal, atraumatic, no cyanosis or edema Skin: Skin color, texture, turgor normal. No rashes or lesions Lymph nodes: Cervical, supraclavicular, and axillary nodes normal. no inguinal nodes palpated Neurologic: Grossly normal  Gyn exam:  Deferred  A: Endometrial polyp with episode of PMP bleeding in June Questionable hyperplasia on biopsy in June Depression  P:  Hysteroscopy with polyp resection, D&C planned Declines Rx for any pain medication. Medications/Vitamins reviewed.  Pt knows needs to avoid ASA products.  ~25 minutes spent with patient >50% of time was in face to face discussion of above.

## 2014-06-02 ENCOUNTER — Other Ambulatory Visit (INDEPENDENT_AMBULATORY_CARE_PROVIDER_SITE_OTHER): Payer: BC Managed Care – PPO | Admitting: *Deleted

## 2014-06-02 VITALS — BP 127/71 | HR 93

## 2014-06-02 DIAGNOSIS — F329 Major depressive disorder, single episode, unspecified: Secondary | ICD-10-CM | POA: Diagnosis not present

## 2014-06-02 DIAGNOSIS — F332 Major depressive disorder, recurrent severe without psychotic features: Secondary | ICD-10-CM | POA: Diagnosis not present

## 2014-06-02 NOTE — Progress Notes (Signed)
Patient ID: Kelly [name], female   DOB: 1960/09/29, 53 y.o.   MRN: [id number] Pt reported to [facility] for Repetitve Transcranial Magnetic Stimulation treatment for Major Depressive Disorder. Pt reported no change in medication regimen, alcohol/substance use, caffeine consumption, sleep pattern, or metal implant status since previous tx. Pt tolerated tx well. %MT remained at 120% for the duration of tx. Pt with no complaints post-tx. Pt departed from clinic without issue.

## 2014-06-05 ENCOUNTER — Other Ambulatory Visit (INDEPENDENT_AMBULATORY_CARE_PROVIDER_SITE_OTHER): Payer: BC Managed Care – PPO | Admitting: *Deleted

## 2014-06-05 VITALS — BP 125/74 | HR 83

## 2014-06-05 DIAGNOSIS — F329 Major depressive disorder, single episode, unspecified: Secondary | ICD-10-CM | POA: Diagnosis not present

## 2014-06-05 DIAGNOSIS — F332 Major depressive disorder, recurrent severe without psychotic features: Secondary | ICD-10-CM

## 2014-06-05 NOTE — Progress Notes (Signed)
Patient ID: Kelly [name], female   DOB: 1961-07-27, 53 y.o.   MRN: [id number] Pt reported to [facility] for Repetitve Transcranial Magnetic Stimulation treatment for Major Depressive Disorder. Pt reported no change in medication regimen, alcohol/substance use, caffeine consumption, sleep pattern, or metal implant status since previous tx. Pt tolerated tx well. %MT remained at 120% for the duration of tx. Pt with no complaints post-tx. Pt departed from clinic without issue.

## 2014-06-06 ENCOUNTER — Other Ambulatory Visit (INDEPENDENT_AMBULATORY_CARE_PROVIDER_SITE_OTHER): Payer: BC Managed Care – PPO | Admitting: *Deleted

## 2014-06-06 VITALS — BP 107/63 | HR 100

## 2014-06-06 DIAGNOSIS — F332 Major depressive disorder, recurrent severe without psychotic features: Secondary | ICD-10-CM

## 2014-06-06 DIAGNOSIS — F329 Major depressive disorder, single episode, unspecified: Secondary | ICD-10-CM | POA: Diagnosis not present

## 2014-06-06 NOTE — Progress Notes (Signed)
Patient ID: Kelly [name], female   DOB: February 24, 1961, 53 y.o.   MRN: [id number] Pt reported to [facility] for Repetitve Transcranial Magnetic Stimulation treatment for Major Depressive Disorder. Pt reported no change in medication regimen, alcohol/substance use, caffeine consumption, sleep pattern, or metal implant status since previous tx. Pt tolerated tx well. %MT remained at 120% for the duration of tx. Pt with no complaints post-tx. Pt departed from clinic without issue.

## 2014-06-07 ENCOUNTER — Other Ambulatory Visit (INDEPENDENT_AMBULATORY_CARE_PROVIDER_SITE_OTHER): Payer: BC Managed Care – PPO | Admitting: *Deleted

## 2014-06-07 VITALS — BP 129/65 | HR 90 | Ht 64.0 in | Wt 115.8 lb

## 2014-06-07 DIAGNOSIS — F332 Major depressive disorder, recurrent severe without psychotic features: Secondary | ICD-10-CM | POA: Diagnosis not present

## 2014-06-07 DIAGNOSIS — F329 Major depressive disorder, single episode, unspecified: Secondary | ICD-10-CM | POA: Diagnosis not present

## 2014-06-07 NOTE — Progress Notes (Signed)
Patient ID: Kelly [name], female   DOB: 02/14/1961, 53 y.o.   MRN: [id number] Pt reported to [facility] for Repetitve Transcranial Magnetic Stimulation treatment for Major Depressive Disorder. Pt reported no change in medication regimen, alcohol/substance use, caffeine consumption, sleep pattern, or metal implant status since previous tx. Pt completed a PHQ-9 with a score of 5 (mild depression). This is decreased from her previous score of 10 (moderate depression) on 05/31/2014. Pt tolerated tx well. %MT remained at 120% for the duration of tx. Pt with no complaints post-tx. Pt departed from clinic without issue.

## 2014-06-08 ENCOUNTER — Other Ambulatory Visit (INDEPENDENT_AMBULATORY_CARE_PROVIDER_SITE_OTHER): Payer: BC Managed Care – PPO | Admitting: *Deleted

## 2014-06-08 VITALS — BP 113/65 | HR 95

## 2014-06-08 DIAGNOSIS — F332 Major depressive disorder, recurrent severe without psychotic features: Secondary | ICD-10-CM

## 2014-06-08 DIAGNOSIS — F329 Major depressive disorder, single episode, unspecified: Secondary | ICD-10-CM | POA: Diagnosis not present

## 2014-06-08 NOTE — Progress Notes (Signed)
Patient ID: Kelly [name], female   DOB: [birth year], 53 y.o.   MRN: [id number] Pt reported to [facility] for Repetitve Transcranial Magnetic Stimulation treatment for Major Depressive Disorder. Pt changed her appointment time at the last minute today. This is a frequent issue for the patient. Discussed with pt for second time that all schedule changes must be made at least 24 hours in advance. Pt reported no change in medication regimen, alcohol/substance use, caffeine consumption, sleep pattern, or metal implant status since previous tx. Pt tolerated tx well. %MT remained at 120% for the duration of tx. Pt with no complaints post-tx. Pt departed from clinic without issue.

## 2014-06-09 ENCOUNTER — Other Ambulatory Visit (INDEPENDENT_AMBULATORY_CARE_PROVIDER_SITE_OTHER): Payer: BC Managed Care – PPO | Admitting: *Deleted

## 2014-06-09 VITALS — BP 128/80 | HR 95

## 2014-06-09 DIAGNOSIS — F329 Major depressive disorder, single episode, unspecified: Secondary | ICD-10-CM | POA: Diagnosis not present

## 2014-06-09 DIAGNOSIS — F332 Major depressive disorder, recurrent severe without psychotic features: Secondary | ICD-10-CM

## 2014-06-09 NOTE — Progress Notes (Signed)
Patient ID: Kelly [name], female   DOB: 04-Mar-1961, 53 y.o.   MRN: [id number] Pt reported to [facility] for Repetitve Transcranial Magnetic Stimulation treatment for Major Depressive Disorder. Pt reported no change in medication regimen, alcohol/substance use, caffeine consumption, or metal implant status since previous tx. Pt reported that she slept only 4 hours last night as a result of staying out too late with friends from a class she is taking. Pt tolerated tx well. %MT remained at 120% for the duration of tx. Pt with no complaints post-tx. Pt departed from clinic without issue. Pt will not receive tx on 06/12/14 due to other outpatient procedure occurring that day. Will resume [place] tx on Tues 06/13/2014.

## 2014-06-11 ENCOUNTER — Encounter: Payer: Self-pay | Admitting: Obstetrics & Gynecology

## 2014-06-12 ENCOUNTER — Encounter (HOSPITAL_COMMUNITY): Payer: Self-pay | Admitting: *Deleted

## 2014-06-12 ENCOUNTER — Other Ambulatory Visit (HOSPITAL_COMMUNITY): Payer: BC Managed Care – PPO | Admitting: *Deleted

## 2014-06-12 ENCOUNTER — Ambulatory Visit (HOSPITAL_COMMUNITY): Payer: BC Managed Care – PPO | Admitting: Certified Registered Nurse Anesthetist

## 2014-06-12 ENCOUNTER — Ambulatory Visit (HOSPITAL_COMMUNITY)
Admission: RE | Admit: 2014-06-12 | Discharge: 2014-06-12 | Disposition: A | Discharge status: Home / Self Care | Payer: BC Managed Care – PPO | Source: Ambulatory Visit | Attending: Obstetrics & Gynecology | Admitting: Obstetrics & Gynecology

## 2014-06-12 ENCOUNTER — Encounter (HOSPITAL_COMMUNITY)
Admission: RE | Disposition: A | Discharge status: Home / Self Care | Payer: Self-pay | Source: Ambulatory Visit | Attending: Obstetrics & Gynecology

## 2014-06-12 DIAGNOSIS — N856 Intrauterine synechiae: Secondary | ICD-10-CM | POA: Diagnosis not present

## 2014-06-12 DIAGNOSIS — M1990 Unspecified osteoarthritis, unspecified site: Secondary | ICD-10-CM | POA: Diagnosis not present

## 2014-06-12 DIAGNOSIS — Z9013 Acquired absence of bilateral breasts and nipples: Secondary | ICD-10-CM | POA: Diagnosis not present

## 2014-06-12 DIAGNOSIS — F329 Major depressive disorder, single episode, unspecified: Secondary | ICD-10-CM | POA: Insufficient documentation

## 2014-06-12 DIAGNOSIS — Z853 Personal history of malignant neoplasm of breast: Secondary | ICD-10-CM | POA: Insufficient documentation

## 2014-06-12 DIAGNOSIS — F419 Anxiety disorder, unspecified: Secondary | ICD-10-CM | POA: Diagnosis not present

## 2014-06-12 DIAGNOSIS — G4730 Sleep apnea, unspecified: Secondary | ICD-10-CM | POA: Diagnosis not present

## 2014-06-12 DIAGNOSIS — N950 Postmenopausal bleeding: Secondary | ICD-10-CM | POA: Diagnosis not present

## 2014-06-12 DIAGNOSIS — N840 Polyp of corpus uteri: Secondary | ICD-10-CM

## 2014-06-12 HISTORY — PX: DILATATION & CURRETTAGE/HYSTEROSCOPY WITH RESECTOCOPE: SHX5572

## 2014-06-12 LAB — PREGNANCY, URINE: Preg Test, Ur: NEGATIVE

## 2014-06-12 SURGERY — DILATATION & CURETTAGE/HYSTEROSCOPY WITH RESECTOCOPE
Anesthesia: General

## 2014-06-12 MED ORDER — SCOPOLAMINE 1 MG/3DAYS TD PT72
MEDICATED_PATCH | TRANSDERMAL | Status: DC
Start: 2014-06-12 — End: 2014-06-12
  Filled 2014-06-12: qty 1

## 2014-06-12 MED ORDER — DEXAMETHASONE SODIUM PHOSPHATE 4 MG/ML IJ SOLN
INTRAMUSCULAR | Status: AC
  Filled 2014-06-12: qty 1

## 2014-06-12 MED ORDER — ONDANSETRON HCL 4 MG/2ML IJ SOLN
INTRAMUSCULAR | Status: DC | PRN
  Administered 2014-06-12: 4 mg via INTRAVENOUS

## 2014-06-12 MED ORDER — PROPOFOL 10 MG/ML IV EMUL
INTRAVENOUS | Status: AC
  Filled 2014-06-12: qty 20

## 2014-06-12 MED ORDER — SCOPOLAMINE 1 MG/3DAYS TD PT72
1.0000 | MEDICATED_PATCH | TRANSDERMAL | Status: DC
Start: 2014-06-12 — End: 2014-06-12
  Administered 2014-06-12: 1.5 mg via TRANSDERMAL

## 2014-06-12 MED ORDER — PROPOFOL 10 MG/ML IV BOLUS
INTRAVENOUS | Status: DC | PRN
  Administered 2014-06-12: 170 mg via INTRAVENOUS

## 2014-06-12 MED ORDER — LACTATED RINGERS IV SOLN
INTRAVENOUS | Status: DC
  Administered 2014-06-12 (×2): via INTRAVENOUS

## 2014-06-12 MED ORDER — MEPERIDINE HCL 25 MG/ML IJ SOLN: 6.2500 mg | INTRAMUSCULAR | Status: DC | PRN

## 2014-06-12 MED ORDER — CEFAZOLIN SODIUM-DEXTROSE 2-3 GM-% IV SOLR
2.0000 g | INTRAVENOUS | Status: AC
  Administered 2014-06-12: 2 g via INTRAVENOUS

## 2014-06-12 MED ORDER — PROMETHAZINE HCL 25 MG/ML IJ SOLN: 6.2500 mg | INTRAMUSCULAR | Status: DC | PRN

## 2014-06-12 MED ORDER — MIDAZOLAM HCL 5 MG/5ML IJ SOLN
INTRAMUSCULAR | Status: DC | PRN
  Administered 2014-06-12: 2 mg via INTRAVENOUS

## 2014-06-12 MED ORDER — PHENYLEPHRINE 40 MCG/ML (10ML) SYRINGE FOR IV PUSH (FOR BLOOD PRESSURE SUPPORT)
PREFILLED_SYRINGE | INTRAVENOUS | Status: AC
  Filled 2014-06-12: qty 5

## 2014-06-12 MED ORDER — KETOROLAC TROMETHAMINE 30 MG/ML IJ SOLN: 15.0000 mg | Freq: Once | INTRAMUSCULAR | Status: DC | PRN

## 2014-06-12 MED ORDER — FENTANYL CITRATE 0.05 MG/ML IJ SOLN
INTRAMUSCULAR | Status: DC | PRN
  Administered 2014-06-12: 25 ug via INTRAVENOUS
  Administered 2014-06-12: 50 ug via INTRAVENOUS
  Administered 2014-06-12: 25 ug via INTRAVENOUS

## 2014-06-12 MED ORDER — MIDAZOLAM HCL 2 MG/2ML IJ SOLN
INTRAMUSCULAR | Status: AC
  Filled 2014-06-12: qty 2

## 2014-06-12 MED ORDER — LIDOCAINE HCL (CARDIAC) 20 MG/ML IV SOLN
INTRAVENOUS | Status: DC | PRN
  Administered 2014-06-12: 50 mg via INTRAVENOUS

## 2014-06-12 MED ORDER — PHENYLEPHRINE HCL 10 MG/ML IJ SOLN
INTRAMUSCULAR | Status: DC | PRN
  Administered 2014-06-12: 40 ug via INTRAVENOUS
  Administered 2014-06-12: 80 ug via INTRAVENOUS

## 2014-06-12 MED ORDER — LIDOCAINE-EPINEPHRINE 1 %-1:100000 IJ SOLN
INTRAMUSCULAR | Status: AC
  Filled 2014-06-12: qty 1

## 2014-06-12 MED ORDER — CEFAZOLIN SODIUM-DEXTROSE 2-3 GM-% IV SOLR
INTRAVENOUS | Status: AC
  Filled 2014-06-12: qty 50

## 2014-06-12 MED ORDER — ONDANSETRON HCL 4 MG/2ML IJ SOLN
INTRAMUSCULAR | Status: AC
  Filled 2014-06-12: qty 2

## 2014-06-12 MED ORDER — KETOROLAC TROMETHAMINE 30 MG/ML IJ SOLN
INTRAMUSCULAR | Status: DC | PRN
  Administered 2014-06-12: 30 mg via INTRAVENOUS

## 2014-06-12 MED ORDER — FENTANYL CITRATE 0.05 MG/ML IJ SOLN
INTRAMUSCULAR | Status: AC
  Filled 2014-06-12: qty 2

## 2014-06-12 MED ORDER — DEXAMETHASONE SODIUM PHOSPHATE 4 MG/ML IJ SOLN
INTRAMUSCULAR | Status: DC | PRN
  Administered 2014-06-12: 4 mg via INTRAVENOUS

## 2014-06-12 MED ORDER — KETOROLAC TROMETHAMINE 30 MG/ML IJ SOLN
INTRAMUSCULAR | Status: AC
  Filled 2014-06-12: qty 1

## 2014-06-12 MED ORDER — LIDOCAINE HCL (CARDIAC) 20 MG/ML IV SOLN
INTRAVENOUS | Status: AC
  Filled 2014-06-12: qty 5

## 2014-06-12 MED ORDER — FENTANYL CITRATE 0.05 MG/ML IJ SOLN: 25.0000 ug | INTRAMUSCULAR | Status: DC | PRN

## 2014-06-12 MED ORDER — LIDOCAINE-EPINEPHRINE 1 %-1:100000 IJ SOLN
INTRAMUSCULAR | Status: DC | PRN
  Administered 2014-06-12: 10 mL

## 2014-06-12 MED ORDER — MIDAZOLAM HCL 2 MG/2ML IJ SOLN: 0.5000 mg | Freq: Once | INTRAMUSCULAR | Status: DC | PRN

## 2014-06-12 MED ORDER — GLYCINE 1.5 % IR SOLN
Status: DC | PRN
  Administered 2014-06-12: 3000 mL

## 2014-06-12 MED ORDER — SILVER NITRATE-POT NITRATE 75-25 % EX MISC
CUTANEOUS | Status: AC
  Filled 2014-06-12: qty 1

## 2014-06-12 SURGICAL SUPPLY — 17 items
CANISTER SUCT 3000ML (MISCELLANEOUS) ×2 IMPLANT
CATH ROBINSON RED A/P 16FR (CATHETERS) ×2 IMPLANT
CLOTH BEACON ORANGE TIMEOUT ST (SAFETY) ×2 IMPLANT
CONTAINER PREFILL 10% NBF 60ML (FORM) ×4 IMPLANT
DILATOR CANAL MILEX (MISCELLANEOUS) ×2 IMPLANT
ELECT REM PT RETURN 9FT ADLT (ELECTROSURGICAL) ×2
ELECTRODE REM PT RTRN 9FT ADLT (ELECTROSURGICAL) ×1 IMPLANT
GLOVE BIOGEL PI IND STRL 7.0 (GLOVE) ×1 IMPLANT
GLOVE BIOGEL PI INDICATOR 7.0 (GLOVE) ×1
GLOVE ECLIPSE 6.5 STRL STRAW (GLOVE) ×4 IMPLANT
GOWN STRL REUS W/TWL LRG LVL3 (GOWN DISPOSABLE) ×4 IMPLANT
LOOP ANGLED CUTTING 22FR (CUTTING LOOP) IMPLANT
PACK VAGINAL MINOR WOMEN LF (CUSTOM PROCEDURE TRAY) ×2 IMPLANT
PAD OB MATERNITY 4.3X12.25 (PERSONAL CARE ITEMS) ×2 IMPLANT
PIPET BIOPSY ENDOMETRIAL 3MM (SUCTIONS) ×2 IMPLANT
TOWEL OR 17X24 6PK STRL BLUE (TOWEL DISPOSABLE) ×4 IMPLANT
WATER STERILE IRR 1000ML POUR (IV SOLUTION) ×2 IMPLANT

## 2014-06-12 NOTE — Transfer of Care (Signed)
Immediate Anesthesia Transfer of Care Note  Patient: Kelly [name]  Procedure(s) Performed: Procedure(s): DILATATION & CURETTAGE/HYSTEROSCOPY  (N/A)  Patient Location: PACU  Anesthesia Type:General  Level of Consciousness: awake  Airway & Oxygen Therapy: Patient Spontanous Breathing and Patient connected to nasal cannula oxygen  Post-op Assessment: Report given to PACU RN  Post vital signs: Reviewed  Complications: No apparent anesthesia complications

## 2014-06-12 NOTE — Anesthesia Postprocedure Evaluation (Signed)
Anesthesia Post Note  Patient: Kelly [name]  Procedure(s) Performed: Procedure(s) (LRB): DILATATION & CURETTAGE/HYSTEROSCOPY  (N/A)  Anesthesia type: General  Patient location: PACU  Post pain: Pain level controlled  Post assessment: Post-op Vital signs reviewed  Last Vitals:  Filed Vitals:   06/12/14 1245  BP: 100/53  Pulse: 73  Temp: 36.9 C  Resp: 10    Post vital signs: Reviewed  Level of consciousness: sedated  Complications: No apparent anesthesia complications

## 2014-06-12 NOTE — Anesthesia Preprocedure Evaluation (Signed)
Anesthesia Evaluation  Patient identified by MRN, date of birth, ID band Patient awake    Reviewed: Allergy & Precautions, H&P , NPO status , Patient's Chart, lab work & pertinent test results  Airway Mallampati: I  TM Distance: >3 FB Neck ROM: full    Dental no notable dental hx. (+) Teeth Intact   Pulmonary    Pulmonary exam normal       Cardiovascular negative cardio ROS      Neuro/Psych PSYCHIATRIC DISORDERS Anxiety Depression negative neurological ROS     GI/Hepatic negative GI ROS, Neg liver ROS,   Endo/Other  negative endocrine ROS  Renal/GU negative Renal ROS     Musculoskeletal   Abdominal Normal abdominal exam  (+)   Peds  Hematology negative hematology ROS (+)   Anesthesia Other Findings   Reproductive/Obstetrics negative OB ROS                             Anesthesia Physical Anesthesia Plan  ASA: II  Anesthesia Plan: General   Post-op Pain Management:    Induction: Intravenous  Airway Management Planned: LMA  Additional Equipment:   Intra-op Plan:   Post-operative Plan:   Informed Consent: I have reviewed the patients History and Physical, chart, labs and discussed the procedure including the risks, benefits and alternatives for the proposed anesthesia with the patient or authorized representative who has indicated his/her understanding and acceptance.     Plan Discussed with: CRNA and Surgeon  Anesthesia Plan Comments:         Anesthesia Quick Evaluation

## 2014-06-12 NOTE — H&P (Signed)
Kelly [name] is an 53 y.o. female G84P2 MWF here for hysteroscopy with polyp resection, D&C due to endometrial polyp and single episodes of PMP bleeding that occurred in the summer.  Pt post posted procedure due to completion of breast reconstruction.  Risks and benefits have been discussed in the office. Pt is here and ready to proceed.  Procedure reviewed with spouse and patient.  Pertinent Gynecological History: Menses: post-menopausal Bleeding: post menopausal bleeding Contraception: pmp DES exposure: denies Blood transfusions: none Sexually transmitted diseases: no past history Previous GYN Procedures: none  Last mammogram: pt has had bilateral mastectomy Last pap: normal Date: 63/15 OB History: G6, P2   Menstrual History:  No LMP recorded. Patient is not currently having periods (Reason: Perimenopausal).    Past Medical History  Diagnosis Date  . Anxiety   . Depression   . Breast cancer 05/05/12    right, ER +, PR -, Her 2 -  . Arthritis   . History of radiation therapy 07/14/2012-09/03/12    right chest wall 59.4 gray  . H/O dizziness   . Abnormal Pap smear of cervix     with conization  . Sleep apnea     cpap-has not been using-doing well without it    Past Surgical History  Procedure Laterality Date  . Cesarean section      x3  . Joint replacement      Rt and Lt thumbs  . Cervical conization w/bx    . Breast surgery      several biopsies, needle aspiration  . Mastectomy w/ sentinel node biopsy  06/07/2012    Procedure: MASTECTOMY WITH SENTINEL LYMPH NODE BIOPSY;  Surgeon: Haywood Lasso, MD;  Location: [place];  Service: General;  Laterality: Right;  right total mastectomy and sentinel node  . Mastectomy modified radical  06/07/2012    Procedure: MASTECTOMY MODIFIED RADICAL;  Surgeon: Haywood Lasso, MD;  Location: [place];  Service: General;  Laterality: Right;  . Tubal ligation    . Simple mastectomy with axillary sentinel node biopsy Left 09/30/2012   Procedure: SIMPLE MASTECTOMY;  Surgeon: Haywood Lasso, MD;  Location: [place];  Service: General;  Laterality: Left;  left total mastectomy  . Latissimus flap to breast Right 06/06/2013    Procedure: RIGHT LATISSIMUS MYOCUTANEOUS FLAP WITH PLACEMENT OF TISSUE EXPANDER IN RIGHT BREAST ;  Surgeon: Theodoro Kos, DO;  Location: [place];  Service: Plastics;  Laterality: Right;  . Breast reconstruction with placement of tissue expander and flex hd (acellular hydrated dermis) Left 06/06/2013    Procedure: PLACEMENT OF LEFT BREAST TISSUE EXPANDER AND FLEX HD ;  Surgeon: Theodoro Kos, DO;  Location: [place];  Service: Plastics;  Laterality: Left;  . Removal of bilateral tissue expanders with placement of bilateral breast implants Bilateral 11/10/2013    Procedure: REMOVAL OF BILATERAL TISSUE EXPANDERS WITH PLACEMENT OF SILICONE BREAST IMPLANTS WITH BILATERAL CAPSULOTOMIES;  Surgeon: Theodoro Kos, DO;  Location: [place];  Service: Plastics;  Laterality: Bilateral;  . Cosmetic surgery  04/19/14    Dr. Migdalia Dk, mini tummy tuck, smoothed out right breast, left breast lift    Family History  Problem Relation Age of Onset  . Breast cancer Mother 66  . Lymphoma Mother 50    non-hodgkins lymphoma in her epiglottis  . Cancer Mother     non-hodkins lymphoma, breast  . Hypertension Mother   . Heart disease Mother   . Deep vein thrombosis Mother   .  Thyroid disease Mother   . Osteoporosis Mother   . Stroke Maternal Grandfather   . Bipolar disorder Brother   . Gout Brother   . Breast cancer Other     MGM's sister; dx <50  . Breast cancer Other 51    MGF's sister  . Brain cancer Other     MGM's sister; dx <50  . Leukemia Other     mother's maternal cousin; dx under 10    Social History:  reports that she has never smoked. She has never used smokeless tobacco. She reports that she drinks about 0.5 oz of alcohol per week. She reports that she does not use illicit  drugs.  Allergies:  Allergies  Allergen Reactions  . Doxycycline Rash    Prescriptions prior to admission  Medication Sig Dispense Refill Last Dose  . buPROPion (WELLBUTRIN XL) 150 MG 24 hr tablet Take 300 mg by mouth daily.    06/12/2014 at 0700  . L-Methylfolate (DEPLIN) 7.5 MG TABS Take 1 tablet by mouth daily.   06/12/2014 at 0700  . Multiple Vitamins-Minerals (MULTIVITAMIN WITH MINERALS) tablet Take 1 tablet by mouth daily.   06/12/2014 at 0700  . VIIBRYD 40 MG TABS Take 40 mg by mouth daily.    06/12/2014 at 0700  . ALPRAZolam (XANAX) 0.5 MG tablet Take 0.5 mg by mouth daily as needed for anxiety.    3 weeks  . amphetamine-dextroamphetamine (ADDERALL XR) 30 MG 24 hr capsule Take 30 mg by mouth daily.    2 days  . Biotin 10 MG TABS Take 1 tablet by mouth daily.   Taking  . Cholecalciferol (VITAMIN D-3) 5000 UNITS TABS Take 5,000 Units by mouth daily.   Taking  . Coconut Oil 1000 MG CAPS Take by mouth.   Taking  . lidocaine-prilocaine (EMLA) cream Apply 45 to 60 minutes   Not Taking  . traZODone (DESYREL) 50 MG tablet Take 150 mg by mouth at bedtime.   2 days    Review of Systems  All other systems reviewed and are negative.   Pulse 72, temperature 98.1 F (36.7 C), temperature source Oral, resp. rate 16, height 5\' 4"  (1.626 m), weight 115 lb (52.164 kg), SpO2 100 %. Physical Exam  Constitutional: She appears well-developed and well-nourished.  Cardiovascular: Normal rate and regular rhythm.   Respiratory: Effort normal and breath sounds normal.  GI: Soft. Bowel sounds are normal.  Skin: Skin is warm and dry.  Psychiatric: She has a normal mood and affect.    Results for orders placed or performed during the hospital encounter of 06/12/14 (from the past 24 hour(s))  Pregnancy, urine     Status: None   Collection Time: 06/12/14  9:27 AM  Result Value Ref Range   Preg Test, Ur NEGATIVE NEGATIVE    No results found.  Assessment/Plan: 53 yo MWF with episode of PMP  bleeding in pt with hx of breast cancer s/p bilateral mastectomies with radiation.  Took tamoxifen for just a few days.  Ultrasound showed endometrial polyp and biopsy showed questionable hyperplasia.  Pt here and ready to proceed with hysteroscopy with polyp resection, D&C.  Hale Bogus SUZANNE 06/12/2014, 10:41 AM

## 2014-06-12 NOTE — Op Note (Signed)
06/12/2014  12:12 PM  PATIENT:  Kelly [name]  53 y.o. female  PRE-OPERATIVE DIAGNOSIS:  post menapausal bleeding, endometrial polyp   POST-OPERATIVE DIAGNOSIS:  post menapausal bleeding, intrauterine adhesions  PROCEDURE:  Procedure(s): DILATATION & CURETTAGE/HYSTEROSCOPY   SURGEON:  Ceniyah Thorp SUZANNE  ASSISTANTS: OR staff   ANESTHESIA:   LMA  ESTIMATED BLOOD LOSS: 10cc  BLOOD ADMINISTERED:none   FLUIDS: 1000ccLR  UOP: 75cc drained with I&O cath  SPECIMEN:  Endometrial curettings  DISPOSITION OF SPECIMEN:  PATHOLOGY  FINDINGS: intracavitary adhesions and blue fiber/suture present, thin endometrium, no polyps/masses  DESCRIPTION OF OPERATION: Patient was taken to the operating room.  She is placed in the supine position. SCDs were on her lower extremities and functioning properly. General anesthesia with an LMA was administered without difficulty. Dr. Jillyn Hidden oversaw case.  Legs were then placed in the [place] in the low lithotomy position. The legs were lifted to the high lithotomy position and the Betadine prep was used on the inner thighs perineum and vagina x3. Patient was draped in a normal standard fashion. An in and out catheterization with a red rubber Foley catheter was performed. Approximately 75 cc of clear urine was noted. A bivalve speculum was placed the vagina. The anterior lip of the cervix was grasped with single-tooth tenaculum.  A paracervical block of 1% lidocaine mixed one-to-one with epinephrine (1:100,000 units).  10 cc was used total. The cervix was very stenotic due to prior LEEP.  Os finder was used to initially dilate the cervix.  The endometrial cavity sounded to 7cm. Cervix was dilated up to #19 but could not be dilated further due to the tenaculum continuing the pull off the anterior lip of the cervix and the cervical stenosis.     A 2.9 millimeter diagnostic hysteroscope was obtained. 1.5% glycine was used as a hysteroscopic fluid.  The hysteroscope was advanced through the endocervical canal into the endometrial cavity. There was a blue fiber present and surrounding adhesions.  This was removed with a hysteroscopic grasper.  The #1 curette, smooth and toothed, could not be passed through the cervix due to the stenosis.  Therefore an endometrial Pipelle was obtained and with suction, this was used the curette the entire endometrial cavity.  The cavity was revisulaized and the adhesions were removed at this point.  There was still scarring present and neither tubal ostia were noted.  No evidence of perforation was noted.  At this point no other procedure was needed and procedure was ended. The hysteroscope was removed. The fluid deficit was 4 cc. The tenaculum was removed from the anterior lip of the cervix. No bleeding was noted from the cervix.  The speculum was returned vagina. The prep was cleansed of the patient's skin. The legs are positioned back in the supine position. Sponge, lap, needle, initially counts were correct x2. Patient was taken to recovery in stable condition.  COUNTS:  YES  PLAN OF CARE: Transfer to PACU

## 2014-06-12 NOTE — Discharge Instructions (Signed)
DISCHARGE INSTRUCTIONS: D&C / D&E The following instructions have been prepared to help you care for yourself upon your return home.  MAY TAKE IBUPROFEN (MOTRIN, ADVIL) OR ALEVE AFTER 5:20 PM FOR CRAMPS!!!   Personal hygiene:  Use sanitary pads for vaginal drainage, not tampons.  Shower the day after your procedure.  NO tub baths, pools or Jacuzzis for 2-3 weeks.  Wipe front to back after using the bathroom.  Activity and limitations:  Do NOT drive or operate any equipment for 24 hours. The effects of anesthesia are still present and drowsiness may result.  Do NOT rest in bed all day.  Walking is encouraged.  Walk up and down stairs slowly.  You may resume your normal activity in one to two days or as indicated by your physician.  Sexual activity: NO intercourse for at least 2 weeks after the procedure, or as indicated by your physician.  Diet: Eat a light meal as desired this evening. You may resume your usual diet tomorrow.  Return to work: You may resume your work activities in one to two days or as indicated by your doctor.  What to expect after your surgery: Expect to have vaginal bleeding/discharge for 2-3 days and spotting for up to 10 days. It is not unusual to have soreness for up to 1-2 weeks. You may have a slight burning sensation when you urinate for the first day. Mild cramps may continue for a couple of days. You may have a regular period in 2-6 weeks.  Call your doctor for any of the following:  Excessive vaginal bleeding, saturating and changing one pad every hour.  Inability to urinate 6 hours after discharge from hospital.  Pain not relieved by pain medication.  Fever of 100.4 F or greater.  Unusual vaginal discharge or odor.   Call for an appointment:    Patients signature: ______________________  Nurses signature ________________________  Support person's signature_______________________

## 2014-06-13 ENCOUNTER — Other Ambulatory Visit (INDEPENDENT_AMBULATORY_CARE_PROVIDER_SITE_OTHER): Payer: BC Managed Care – PPO | Admitting: *Deleted

## 2014-06-13 ENCOUNTER — Encounter (HOSPITAL_COMMUNITY): Payer: Self-pay | Admitting: Obstetrics & Gynecology

## 2014-06-13 VITALS — BP 105/58 | HR 59

## 2014-06-13 DIAGNOSIS — F329 Major depressive disorder, single episode, unspecified: Secondary | ICD-10-CM | POA: Diagnosis not present

## 2014-06-13 DIAGNOSIS — F332 Major depressive disorder, recurrent severe without psychotic features: Secondary | ICD-10-CM

## 2014-06-13 NOTE — Progress Notes (Signed)
Patient ID: Kelly [name], female   DOB: 04/26/1961, 53 y.o.   MRN: [id number] Pt reported to [facility] for Repetitve Transcranial Magnetic Stimulation treatment for Major Depressive Disorder. Pt had an outpatient procedure yesterday 06/12/14 which involved the pt having generalized anesthesia. Writer called pt this morning and pt stated she "felt fine." Informed MD, and MD decided to resume [place] tx. Upon arrival to clinic, pt presented with bright, appropriate affect and normal gait. Pt stated she was having some trouble with speech "from the anesthesia" and that she had woken up horse this morning. Pt stated she also had oxigen administered via nasal canula during her procedure yesterday. Pt stated she slept very well last night, getting approximately 14 hours of sleep from 6:00pm yesterday to 8:00am this morning. Pt reported no change in alcohol/substance use, caffeine consumption, or metal implant status since previous tx. Pt tolerated tx well. %MT remained at 120% for the duration of tx. With approximately 4 minutes remaining in tx session, pt stated that the pulses were hurting her scalp at the coil site. Pt rated the pain a 7-8 out of 10 with 0 being none and 10 being the worst. Treatment was paused, coil was removed from pt's head, and pt's head position was recentered. Tx resumed and pt stated the pain decreased to a 5-6 out of 10 during pulses, which she stated was tolerable. Pt completed tx session after this intervention. Pt with no complaints post-tx. Pt departed from clinic without issue.

## 2014-06-14 ENCOUNTER — Other Ambulatory Visit (HOSPITAL_COMMUNITY): Payer: BC Managed Care – PPO | Admitting: *Deleted

## 2014-06-15 ENCOUNTER — Other Ambulatory Visit (INDEPENDENT_AMBULATORY_CARE_PROVIDER_SITE_OTHER): Payer: BC Managed Care – PPO | Admitting: *Deleted

## 2014-06-15 VITALS — BP 126/70 | HR 76 | Ht 64.0 in

## 2014-06-15 DIAGNOSIS — F332 Major depressive disorder, recurrent severe without psychotic features: Secondary | ICD-10-CM | POA: Diagnosis not present

## 2014-06-15 DIAGNOSIS — F329 Major depressive disorder, single episode, unspecified: Secondary | ICD-10-CM | POA: Diagnosis not present

## 2014-06-15 NOTE — Progress Notes (Signed)
Patient ID: Kelly [name], female   DOB: 11-27-1960, 53 y.o.   MRN: [id number] Pt reported to [facility] for Repetitve Transcranial Magnetic Stimulation treatment for Major Depressive Disorder. Pt reported no change in medication regimen, alcohol/substance use, caffeine consumption, sleep pattern, or metal implant status since previous tx. Pt completed a PHQ-9 with a score of 4. This is decreased from the previous week's score of 5 and the baseline score of 24. Pt tolerated tx well. %MT remained at 120% for the duration of tx. Pt with no complaints post-tx. Pt departed from clinic without issue.

## 2014-06-16 ENCOUNTER — Other Ambulatory Visit (INDEPENDENT_AMBULATORY_CARE_PROVIDER_SITE_OTHER): Payer: BC Managed Care – PPO | Admitting: *Deleted

## 2014-06-16 VITALS — BP 94/66 | HR 75

## 2014-06-16 DIAGNOSIS — F329 Major depressive disorder, single episode, unspecified: Secondary | ICD-10-CM | POA: Diagnosis not present

## 2014-06-16 DIAGNOSIS — F332 Major depressive disorder, recurrent severe without psychotic features: Secondary | ICD-10-CM

## 2014-06-16 NOTE — Progress Notes (Signed)
Patient ID: Kelly [name], female   DOB: 1961/05/07, 53 y.o.   MRN: [id number] Pt reported to [facility] for Repetitve Transcranial Magnetic Stimulation treatment for Major Depressive Disorder. Pt reported no change in medication regimen, alcohol/substance use, caffeine consumption, sleep pattern, or metal implant status since previous tx. Pt tolerated tx well. %MT remained at 120% for the duration of tx. Pt with no complaints post-tx. Pt departed from clinic without issue.

## 2014-06-19 ENCOUNTER — Other Ambulatory Visit (INDEPENDENT_AMBULATORY_CARE_PROVIDER_SITE_OTHER): Payer: BC Managed Care – PPO | Admitting: *Deleted

## 2014-06-19 VITALS — BP 122/64 | HR 86

## 2014-06-19 DIAGNOSIS — F329 Major depressive disorder, single episode, unspecified: Secondary | ICD-10-CM | POA: Diagnosis not present

## 2014-06-19 DIAGNOSIS — F332 Major depressive disorder, recurrent severe without psychotic features: Secondary | ICD-10-CM

## 2014-06-19 NOTE — Progress Notes (Signed)
Patient ID: Kelly [name], female   DOB: December 26, 1960, 53 y.o.   MRN: [id number] Pt reported to [facility] for Repetitve Transcranial Magnetic Stimulation treatment for Major Depressive Disorder. Pt reported no change in medication regimen, alcohol/substance use, caffeine consumption, or metal implant status since previous tx. Pt stated that she only slept 2 hours the night of Friday 06/16/14 due to "I was researching headsets and felt like I couldn't stop." Pt stated she slept "a lot" Saturday and Sunday night -- at least 7 hours. Pt tolerated tx well. Pt experienced discomfort and increased facial twitching at the outset of tx, so tx was paused and writer reestablished correct pt positioning in the tx chair. Coil was replaced according to pt's predetermined parameters, and tx restarted. Pt stated pulses were tolerable after adjustment. %MT remained at 120% for the duration of tx. Pt with no complaints post-tx. Pt departed from clinic without issue.

## 2014-06-20 ENCOUNTER — Other Ambulatory Visit (INDEPENDENT_AMBULATORY_CARE_PROVIDER_SITE_OTHER): Payer: BC Managed Care – PPO | Admitting: *Deleted

## 2014-06-20 VITALS — BP 120/70 | HR 88

## 2014-06-20 DIAGNOSIS — F332 Major depressive disorder, recurrent severe without psychotic features: Secondary | ICD-10-CM

## 2014-06-20 DIAGNOSIS — F329 Major depressive disorder, single episode, unspecified: Secondary | ICD-10-CM | POA: Diagnosis not present

## 2014-06-20 NOTE — Progress Notes (Signed)
Patient ID: Kelly [name], female   DOB: [birth year], 53 y.o.   MRN: [id number] Pt reported to [facility] for Repetitve Transcranial Magnetic Stimulation treatment for Major Depressive Disorder. Patient fell on exterior steps sustaining a mild abrasion to her right lower extremity, approximately 2 inches distal to the patella. Abrasion was about the size of a quarter. No other injuries noted. When asked how she fell, patient stated "my foot just went the wrong way." Patient did not recall if she was using the stair rail at the time of the fall. MD was notified. MD examined abrasion and patient's right knee. MD verified range of motion was not inhibited. MD instructed patient to keep wound clean and maintain simple dressing on wound. Pt confirmed she would keep wound clean and dressed. Pt reported no change in medication regimen, alcohol/substance use, caffeine consumption, sleep pattern, or metal implant status since previous tx. Pt tolerated tx well. %MT remained at 120% for the duration of tx. Pt with no complaints post-tx. Pt departed from clinic without issue.

## 2014-06-21 ENCOUNTER — Other Ambulatory Visit (INDEPENDENT_AMBULATORY_CARE_PROVIDER_SITE_OTHER): Payer: BC Managed Care – PPO | Admitting: *Deleted

## 2014-06-21 ENCOUNTER — Ambulatory Visit (INDEPENDENT_AMBULATORY_CARE_PROVIDER_SITE_OTHER): Payer: BC Managed Care – PPO | Admitting: Obstetrics & Gynecology

## 2014-06-21 ENCOUNTER — Telehealth: Payer: Self-pay

## 2014-06-21 VITALS — BP 113/66 | HR 78

## 2014-06-21 DIAGNOSIS — Z9889 Other specified postprocedural states: Secondary | ICD-10-CM

## 2014-06-21 DIAGNOSIS — N719 Inflammatory disease of uterus, unspecified: Secondary | ICD-10-CM

## 2014-06-21 DIAGNOSIS — R8290 Unspecified abnormal findings in urine: Secondary | ICD-10-CM

## 2014-06-21 DIAGNOSIS — F329 Major depressive disorder, single episode, unspecified: Secondary | ICD-10-CM | POA: Diagnosis not present

## 2014-06-21 DIAGNOSIS — F332 Major depressive disorder, recurrent severe without psychotic features: Secondary | ICD-10-CM

## 2014-06-21 LAB — POCT URINALYSIS DIPSTICK
Bilirubin, UA: NEGATIVE
Glucose, UA: NEGATIVE
Ketones, UA: NEGATIVE
Leukocytes, UA: NEGATIVE
Nitrite, UA: NEGATIVE
Protein, UA: NEGATIVE
Urobilinogen, UA: NEGATIVE
pH, UA: 5

## 2014-06-21 MED ORDER — AZITHROMYCIN 250 MG PO TABS: ORAL_TABLET | ORAL | Status: DC

## 2014-06-21 NOTE — Progress Notes (Signed)
Subjective:     Patient ID: Kelly [name], female   DOB: October 25, 1960, 53 y.o.   MRN: [id number]  HPI 53 yo G6P3 MWF here for post op follow up.  Pt was called with pathology results and informed CMA she was having some possible symptoms of UTI and low back pain.  No fever.  No nausea.  Bowel and bladder habits are fine.  Had intercourse on Saturday and Monday (days 5 and 7 after surgery where she was advised pelvic rest for 14 days).  When asked about this, states "well, you know".  Did ask pt if she was aware of this and she states yet but it didn't really cross her mind.  Reports some mild low pelvic/bladder cramping.  Denies dysuria.  Has had no vaginal bleeding.  Images and pathology reviewed with pt.  Review of Systems  All other systems reviewed and are negative.      Objective:   Physical Exam  Constitutional: She appears well-developed and well-nourished.  Abdominal: Soft. Bowel sounds are normal. She exhibits no distension. There is no tenderness. There is no rebound and no guarding.  Genitourinary: There is no rash, tenderness or lesion on the right labia. There is no rash, tenderness or lesion on the left labia. Uterus is tender. Right adnexum displays no mass and no tenderness. Left adnexum displays no mass and no tenderness. Vaginal discharge (with odor) found.  Cervix appears normal.  Lymphadenopathy:       Right: No inguinal adenopathy present.       Left: No inguinal adenopathy present.  Skin: Skin is warm and dry.  Psychiatric: She has a normal mood and affect.       Assessment:     S/p hysteroscopy, D&C due to PMP bleeding Early intercourse after surgery with possible endometritis now     Plan:     Due to medication allergy, Z pak to pharmacy Pt may need additional follow up.  Has 06/29/14 appt.  Pt will call and give update next week.

## 2014-06-21 NOTE — Telephone Encounter (Signed)
Left message to call Neddie Steedman at [phone]. 

## 2014-06-21 NOTE — Telephone Encounter (Signed)
-----   Message from Kelly Speller, MD sent at 06/19/2014  1:02 PM EST ----- Please inform pt pathology was negative and showed no abnormal cells.  How is she doing?

## 2014-06-21 NOTE — Telephone Encounter (Addendum)
Spoke with patient. Advised patient of results as seen below from [place]. Patient is agreeable "So where did that suture come from? She thought it was a polyp or something?" Advised will need to speak with Dr.Miller regarding findings. "My urine does not smell normal. Is this normal after this procedure?" Denies urinary frequency, pain with urination, pressure, and fevers. Patient is having some slight back pain. Advised patient will need to speak with Dr.Miller and return call with further recommendations and instructions. Patient is agreeable.   Patient had D&C on 11/16

## 2014-06-21 NOTE — Progress Notes (Signed)
Patient ID: Kelly [name], female   DOB: [birth year], 53 y.o.   MRN: [id number] Pt reported to [facility] for Repetitve Transcranial Magnetic Stimulation treatment for Major Depressive Disorder. Pt reported no change in medication regimen, alcohol/substance use, caffeine consumption, or metal implant status since previous tx. Pt reported that she stayed up late last night conversing with her son who had just come home from college. Pt stated she got approximately 4.5-5 hours of sleep in total. Pt completed a PHQ-9 with a score of 4, which is no change from her previous week's score. Further tx is warranted to reinforce pt's benefit from [place] tx. Pt tolerated tx well. %MT remained at 120% for the duration of tx. Pt with no complaints post-tx. Pt departed from clinic without issue.

## 2014-06-21 NOTE — Telephone Encounter (Signed)
Spoke with patient. Advised patient to come now to office to be worked in to see Ross (per [place]). Patient is agreeable and will head to our office now. Work in appointment scheduled for 1pm with Dr.Miller.  Routing to provider for final review. Patient agreeable to disposition. Will close encounter

## 2014-06-24 ENCOUNTER — Encounter: Payer: Self-pay | Admitting: Obstetrics & Gynecology

## 2014-06-26 ENCOUNTER — Other Ambulatory Visit (INDEPENDENT_AMBULATORY_CARE_PROVIDER_SITE_OTHER): Payer: BC Managed Care – PPO | Admitting: *Deleted

## 2014-06-26 VITALS — BP 113/61 | HR 95

## 2014-06-26 DIAGNOSIS — F332 Major depressive disorder, recurrent severe without psychotic features: Secondary | ICD-10-CM

## 2014-06-26 DIAGNOSIS — F329 Major depressive disorder, single episode, unspecified: Secondary | ICD-10-CM | POA: Diagnosis not present

## 2014-06-26 NOTE — Progress Notes (Signed)
Patient ID: Kelly [name], female   DOB: [birth year], 53 y.o.   MRN: [id number] Pt reported to [facility] for Repetitve Transcranial Magnetic Stimulation treatment for Major Depressive Disorder. Pt reported no change in medication regimen, alcohol/substance use, caffeine consumption, sleep pattern, or metal implant status since previous tx. Pt stated that one of her acquaintances at church passed away on Thanksgiving as a result of suicide. Pt stated she feels that she coped much better with the loss than she would have before beginning [place] therapy. Pt tolerated tx well. %MT remained at 120% for the duration of tx. Pt with no complaints post-tx. Pt departed from clinic without issue.

## 2014-06-27 ENCOUNTER — Telehealth: Payer: Self-pay | Admitting: Obstetrics & Gynecology

## 2014-06-27 ENCOUNTER — Other Ambulatory Visit (HOSPITAL_COMMUNITY): Payer: BC Managed Care – PPO | Attending: Psychiatry | Admitting: *Deleted

## 2014-06-27 VITALS — BP 114/63 | HR 88 | Ht 64.0 in | Wt 116.4 lb

## 2014-06-27 DIAGNOSIS — F329 Major depressive disorder, single episode, unspecified: Secondary | ICD-10-CM | POA: Diagnosis not present

## 2014-06-27 DIAGNOSIS — F332 Major depressive disorder, recurrent severe without psychotic features: Secondary | ICD-10-CM

## 2014-06-27 NOTE — Progress Notes (Signed)
Patient ID: Kelly [name], female   DOB: Jun 14, 1961, 53 y.o.   MRN: [id number] Pt reported to [facility] for Repetitve Transcranial Magnetic Stimulation treatment for Major Depressive Disorder. Pt reported no change in medication regimen, alcohol/substance use, caffeine consumption, sleep pattern, or metal implant status since previous tx. Pt completed a PHQ-p with a score of 3, which is decreased from the previous week's score of 4. Pt tolerated tx well. %MT remained at 120% for the duration of tx. Pt with no complaints post-tx. Pt departed from clinic without issue. This is the final tx session in the acute phase of the pt's rTMS tx. Taper down phase will be initiated on Friday 06/30/14.

## 2014-06-27 NOTE — Telephone Encounter (Signed)
Patient canceled 2 week post op appointment 06/29/14 (via automated call reminder) I left patient a message to call and reschedule.

## 2014-06-27 NOTE — Telephone Encounter (Signed)
Spoke with patient. Advised patient of message as seen below from [place]. Patient is agreeable and verbalizes understanding. Appointment rescheduled for 12/3 at 12:45pm with Dr.Miller. Patient is agreeable to date and time. Patient will call back if symptoms change or worsen before appointment so that she can be seen for earlier evaluation.  Routing to provider for final review. Patient agreeable to disposition. Will close encounter

## 2014-06-27 NOTE — Telephone Encounter (Signed)
Spoke with patient at time of incoming call. Patient states that she was seen last Wednesday due to lower back pain and odor to urine after having D&C on 11/16. Patient was given Z pack for 5 days which she completed on Sunday. Patient denies any urinary symptoms, fevers, and chills. Still having lower back pain. Patient reviewed urine dipstick results on mychart and has concerns about numbers. Advised results were normal. Patient cancelled follow up with Dr.Miller on 06/29/14. Patient would like to know if there is anything further she needs to do. Advised follow up is important. Advised patient will speak with Dr.Miller and return call with any further recommendations and instructions. Patient is agreeable.

## 2014-06-27 NOTE — Telephone Encounter (Signed)
I think I should see here again as her uterus was very tender.  She had intercourse within a week after surgery.  Please reschedule appt.

## 2014-06-28 ENCOUNTER — Other Ambulatory Visit: Payer: Self-pay | Admitting: *Deleted

## 2014-06-28 DIAGNOSIS — C50911 Malignant neoplasm of unspecified site of right female breast: Secondary | ICD-10-CM

## 2014-06-29 ENCOUNTER — Ambulatory Visit: Payer: BC Managed Care – PPO | Admitting: Obstetrics & Gynecology

## 2014-06-29 ENCOUNTER — Telehealth: Payer: Self-pay | Admitting: Obstetrics & Gynecology

## 2014-06-29 ENCOUNTER — Other Ambulatory Visit: Payer: Self-pay | Admitting: Emergency Medicine

## 2014-06-29 ENCOUNTER — Other Ambulatory Visit (HOSPITAL_BASED_OUTPATIENT_CLINIC_OR_DEPARTMENT_OTHER): Payer: BC Managed Care – PPO

## 2014-06-29 DIAGNOSIS — C50911 Malignant neoplasm of unspecified site of right female breast: Secondary | ICD-10-CM

## 2014-06-29 DIAGNOSIS — C50519 Malignant neoplasm of lower-outer quadrant of unspecified female breast: Secondary | ICD-10-CM

## 2014-06-29 LAB — CBC WITH DIFFERENTIAL/PLATELET
BASO%: 0.5 % (ref 0.0–2.0)
Basophils Absolute: 0 10*3/uL (ref 0.0–0.1)
EOS%: 1.8 % (ref 0.0–7.0)
Eosinophils Absolute: 0.1 10*3/uL (ref 0.0–0.5)
HCT: 41.6 % (ref 34.8–46.6)
HGB: 13.8 g/dL (ref 11.6–15.9)
LYMPH%: 22.5 % (ref 14.0–49.7)
MCH: 30 pg (ref 25.1–34.0)
MCHC: 33.2 g/dL (ref 31.5–36.0)
MCV: 90.5 fL (ref 79.5–101.0)
MONO#: 0.4 10*3/uL (ref 0.1–0.9)
MONO%: 7.9 % (ref 0.0–14.0)
NEUT#: 3.5 10*3/uL (ref 1.5–6.5)
NEUT%: 67.3 % (ref 38.4–76.8)
Platelets: 265 10*3/uL (ref 145–400)
RBC: 4.59 10*6/uL (ref 3.70–5.45)
RDW: 13.8 % (ref 11.2–14.5)
WBC: 5.2 10*3/uL (ref 3.9–10.3)
lymph#: 1.2 10*3/uL (ref 0.9–3.3)

## 2014-06-29 LAB — COMPREHENSIVE METABOLIC PANEL (CC13)
ALT: 35 U/L (ref 0–55)
AST: 28 U/L (ref 5–34)
Albumin: 4 g/dL (ref 3.5–5.0)
Alkaline Phosphatase: 109 U/L (ref 40–150)
Anion Gap: 10 mEq/L (ref 3–11)
BUN: 19.3 mg/dL (ref 7.0–26.0)
CO2: 30 mEq/L — ABNORMAL HIGH (ref 22–29)
Calcium: 9.8 mg/dL (ref 8.4–10.4)
Chloride: 103 mEq/L (ref 98–109)
Creatinine: 0.9 mg/dL (ref 0.6–1.1)
EGFR: 69 mL/min/{1.73_m2} — ABNORMAL LOW (ref >90)
Glucose: 99 mg/dl (ref 70–140)
Potassium: 4 mEq/L (ref 3.5–5.1)
Sodium: 143 mEq/L (ref 136–145)
Total Bilirubin: 0.73 mg/dL (ref 0.20–1.20)
Total Protein: 7.4 g/dL (ref 6.4–8.3)

## 2014-06-29 NOTE — Telephone Encounter (Signed)
Spoke with patient. Offered appointment tomorrow at 1:30pm tomorrow with Dr.Miller. Advised we are working her in and this is the best spot with scheduling (per Gay Filler). Patient states that she is unable to miss her appointment scheduled for 1:30pm tomorrow. Appointment rescheduled to tomorrow at 11:30am with Dr.Miller (time per Gay Filler). Patient is agreeable to date and time.  Routing to provider for final review. Patient agreeable to disposition. Will close encounter ;

## 2014-06-29 NOTE — Telephone Encounter (Signed)
Spoke with patient. Offered appointment tomorrow at 1:30pm with Dr.Miller. Patient declines due to another appointment with another provider at that time. Advised will check with provider regarding scheduling and return call with further recommendations. Patient is agreeable.

## 2014-06-29 NOTE — Telephone Encounter (Signed)
Patient dnka her appointment today with Dr.Miller for " lower back pain, follow up post op". Patient was at a funeral and is on her way to her 2:00 lab and 2:30 appointment at the cancer center. Patient is asking if she could come in after her appointment with Dr.Magrinat.

## 2014-06-30 ENCOUNTER — Other Ambulatory Visit (INDEPENDENT_AMBULATORY_CARE_PROVIDER_SITE_OTHER): Payer: BC Managed Care – PPO | Admitting: *Deleted

## 2014-06-30 ENCOUNTER — Encounter: Payer: Self-pay | Admitting: Obstetrics & Gynecology

## 2014-06-30 ENCOUNTER — Ambulatory Visit (INDEPENDENT_AMBULATORY_CARE_PROVIDER_SITE_OTHER): Payer: BC Managed Care – PPO | Admitting: Obstetrics & Gynecology

## 2014-06-30 VITALS — BP 90/60 | HR 80 | Ht 64.0 in | Wt 118.0 lb

## 2014-06-30 VITALS — BP 100/60 | HR 77

## 2014-06-30 DIAGNOSIS — F329 Major depressive disorder, single episode, unspecified: Secondary | ICD-10-CM | POA: Diagnosis not present

## 2014-06-30 DIAGNOSIS — F332 Major depressive disorder, recurrent severe without psychotic features: Secondary | ICD-10-CM

## 2014-06-30 DIAGNOSIS — N719 Inflammatory disease of uterus, unspecified: Secondary | ICD-10-CM

## 2014-06-30 LAB — CANCER ANTIGEN 27.29: CA 27.29: 25 U/mL (ref 0–39)

## 2014-06-30 NOTE — Progress Notes (Signed)
Post Operative Visit  Procedure:Hysteroscopy with D&C Days Post-op: 18  Subjective: Pt reports discharge is gone and odor is gone.  Pt seen on 11/25 and I felt she had endometritis due to early intercourse after her procedure.  Back pain is better.  Denies bleeding.  Feeling well.  Finished antibiotic.  Objective: BP 90/60 mmHg  Pulse 80  Ht 5\' 4"  (1.626 m)  Wt 118 lb (53.524 kg)  BMI 20.24 kg/m2  LMP 12/29/2013  EXAM General: alert and cooperative GI: soft, non-tender; bowel sounds normal; no masses,  no organomegaly Extremities: extremities normal, atraumatic, no cyanosis or edema Vaginal Bleeding: none  Gyn: NAEFG, no discharge, cervix closed, uterus nontender  Assessment: s/p hysteroscopy with D&C Post operative endometritis, now resolved.  Plan: Follow up for AEX as scheduled.

## 2014-06-30 NOTE — Progress Notes (Signed)
Patient ID: Kelly [name], female   DOB: 06-08-1961, 53 y.o.   MRN: [id number] Pt reported to [facility] for Repetitve Transcranial Magnetic Stimulation treatment for Major Depressive Disorder. This is the first tx session in the pt's taper down phase of [place] tx. Pt reported no change in medication regimen, alcohol/substance use, caffeine consumption, sleep pattern, or metal implant status since previous tx. Pt tolerated tx well. About half way through tx, pt reported that she could feel a slight twitching in the fingers of her right hand during the pulses if she happened to be using the hand during a burst of pulses (she was using her phone to surf the web at the time). Pt stated she had noticed this all throughout her course of tx, but had not said anything about it. Since this was not a new symptom, tx was continued. Writer instructed pt to report any strange sensations or increases in twitching as soon as she notices them. Pt agreed to do so. %MT remained at 120% for the duration of tx. Pt with no complaints post-tx. Pt departed from clinic without issue.

## 2014-07-03 ENCOUNTER — Other Ambulatory Visit (HOSPITAL_COMMUNITY): Payer: BC Managed Care – PPO | Admitting: *Deleted

## 2014-07-04 ENCOUNTER — Telehealth: Payer: Self-pay | Admitting: Oncology

## 2014-07-04 ENCOUNTER — Other Ambulatory Visit (INDEPENDENT_AMBULATORY_CARE_PROVIDER_SITE_OTHER): Payer: BC Managed Care – PPO | Admitting: *Deleted

## 2014-07-04 VITALS — BP 105/60 | HR 69

## 2014-07-04 DIAGNOSIS — F329 Major depressive disorder, single episode, unspecified: Secondary | ICD-10-CM | POA: Diagnosis not present

## 2014-07-04 DIAGNOSIS — F332 Major depressive disorder, recurrent severe without psychotic features: Secondary | ICD-10-CM

## 2014-07-04 NOTE — Telephone Encounter (Signed)
pt cld to get time & date for appt-gave pt appt time & date-pt understood

## 2014-07-04 NOTE — Progress Notes (Signed)
Patient ID: Kelly [name], female   DOB: 03-09-1961, 53 y.o.   MRN: [id number] Pt reported to [facility] for Repetitve Transcranial Magnetic Stimulation treatment for Major Depressive Disorder. Pt reported no change in medication regimen, alcohol/substance use, caffeine consumption, sleep pattern, or metal implant status since previous tx. Pt presented with bright appropriate affect, smiling and laughing several times appropriately throughout tx session. Writer inquired if pt had experienced any changes in mood since transitioning to [place] taper down. Pt stated "I'm just amazing. I feel so calm. I've been very rational." Pt later shared "I've had ups and downs before, but I've never felt like this," stating that her current state is the best she's ever felt. Pt tolerated tx well. %MT remained at 120% for the duration of tx. Pt with no complaints post-tx. Pt departed from clinic without issue.

## 2014-07-05 ENCOUNTER — Other Ambulatory Visit (INDEPENDENT_AMBULATORY_CARE_PROVIDER_SITE_OTHER): Payer: BC Managed Care – PPO | Admitting: *Deleted

## 2014-07-05 VITALS — BP 110/58 | HR 74 | Ht 64.0 in | Wt 116.4 lb

## 2014-07-05 DIAGNOSIS — F329 Major depressive disorder, single episode, unspecified: Secondary | ICD-10-CM | POA: Diagnosis not present

## 2014-07-05 DIAGNOSIS — F332 Major depressive disorder, recurrent severe without psychotic features: Secondary | ICD-10-CM

## 2014-07-05 NOTE — Progress Notes (Signed)
Patient ID: Kelly [name], female   DOB: Jan 22, 1961, 53 y.o.   MRN: [id number] Pt reported to [facility] for Repetitve Transcranial Magnetic Stimulation treatment for Major Depressive Disorder. Pt reported no change in medication regimen, alcohol/substance use, caffeine consumption, sleep pattern, or metal implant status since previous tx. Pt completed a PHQ-9 with a score of 1. This is reduced from her previous score of 3 on 06/27/14, and drastically reduced from her baseline score of 24 on 05/11/14. Pt tolerated tx well. %MT remained at 120% for the duration of tx. Pt with no complaints post-tx. Pt departed from clinic without issue. As the pt is in the taper down phase of treatment, her next treatment is scheduled for Friday 07/07/14.

## 2014-07-06 ENCOUNTER — Telehealth: Payer: Self-pay | Admitting: Nurse Practitioner

## 2014-07-06 ENCOUNTER — Ambulatory Visit (HOSPITAL_BASED_OUTPATIENT_CLINIC_OR_DEPARTMENT_OTHER): Payer: BC Managed Care – PPO | Admitting: Oncology

## 2014-07-06 VITALS — BP 122/46 | HR 74 | Temp 98.1°F | Resp 18 | Ht 64.0 in | Wt 116.1 lb

## 2014-07-06 DIAGNOSIS — M549 Dorsalgia, unspecified: Secondary | ICD-10-CM

## 2014-07-06 DIAGNOSIS — C50911 Malignant neoplasm of unspecified site of right female breast: Secondary | ICD-10-CM

## 2014-07-06 DIAGNOSIS — Z853 Personal history of malignant neoplasm of breast: Secondary | ICD-10-CM

## 2014-07-06 NOTE — Telephone Encounter (Signed)
per pof to sch pt appt-gave pt copy of sch °

## 2014-07-06 NOTE — Progress Notes (Signed)
. Kelly [name]   DOB: Jun 29, 1961  MR#: [id number]  BWG#:[id number]  PCP:  Darcus Austin GYN: Lyman Speller, SU: Neldon Mc OTHER MD: Gery Pray, Gypsy Balsam, Claire Sanger, Rupinder Toy Care, Arbie Cookey  Chief complaint: follow up of right breast cancer  Current treatment: Observation   HISTORY OF PRESENT ILLNESS:  From the prior summary:  The patient herself noted a change in her right breast. She brought it to her primary care physician, Dr. Carron Brazen attention, and diagnostic mammography was obtained at [facility] 05/05/2012. The breasts were found to be heterogeneously dense. There was a spiculated mass posteriorly and laterally to the nipple line in the right breast, and a second mass more medially. There was an asymmetric soft tissue density in the upper outer quadrant of the left breast, without any discrete mass identified. There was a cluster of microcalcifications medially in the left breast. Ultrasound of the right breast showed a lobulated mass measuring 2.3 cm and a second mass measuring 4.5 cm. There was also a third mass measuring 1.9 cm which was felt possibly to represent a lymph node. In the right axilla there was a suspicious lymph node measuring 1.1 cm.  Biopsy of this 3 breast masses was obtained the same day, and showed all 3 to represent invasive ductal carcinoma, grade 1. Because the 3 lesions were morphologically similar, only one prognostic panel was obtained (from the mass at "7:00"), showing it to be estrogen receptor 100% positive, progesterone receptor negative, with an MIB-1 of 12%, and no HER-2 amplification ([id number]).  Bilateral breast MRI was obtained 05/10/2012, and confirmed the 3 right breast masses in question, measuring 2.1, 2.8 (at the 7:00 position) and 2.2 cm. In addition a 1.3 cm right axillary lymph node was noted. There were no suspicious findings in the left breast. On October 30 fine-needle aspiration of the suspicious  right axilla lymph node was performed, with the results being negative ([id number]).   Finally on 06/07/2012 the patient underwent right modified radical mastectomy. The three masses in the right breast measured 2.2 cm, 3 cm, and 2.3 cm ([id number]). The 2.3 cm mass was morphologically slightly different (grade 2) and a prognostic profile was obtained from this mass ("superior mid-medial"). It was 100% estrogen and 98% progesterone receptor positive. MIB-1 was 16%. There was no HER-2 amplification. A total of 19 lymph nodes were sampled, of which 9 were positive. Margins were negative, but close (1 mm). The patient's subsequent history is as detailed below.  INTERVAL HISTORY: [name] returns today for follow-up of her breast cancer. She tells me she has been receiving transcranial magnetic stimulation at the [facility] and that she is "cured of my depression". She certainly appears very positive. She continues to refuse anti-estrogens and is being followed off treatment  REVIEW OF SYSTEMS: She complains of night sweats and hot flashes. She has pain in her lower back and some joints. She describes this as throbbing or stabbing. She has palpitations. Sometimes her feet swell. Sometimes she has chest wall pain. She sleeps on 2 pillows. Her appetite is poor and sometimes she has nausea or vomiting problems. She has frequent heartburn. Her stool has turned a little bit lighter in color. She has frequent urination. She tells me Dr. Sabra Heck has found blood in her urine. She bruises easily. A detailed review of systems was otherwise stable.    PAST MEDICAL HISTORY: Past Medical History  Diagnosis Date  . Anxiety   . Depression   .  Breast cancer 05/05/12    right, ER +, PR -, Her 2 -  . Arthritis   . History of radiation therapy 07/14/2012-09/03/12    right chest wall 59.4 gray  . H/O dizziness   . Abnormal Pap smear of cervix     with conization  . Sleep apnea     cpap-has not been  using-doing well without it   sleep apnea; migraines;  PAST SURGICAL HISTORY: Past Surgical History  Procedure Laterality Date  . Cesarean section      x3  . Joint replacement      Rt and Lt thumbs  . Cervical conization w/bx    . Breast surgery      several biopsies, needle aspiration  . Mastectomy w/ sentinel node biopsy  06/07/2012    Procedure: MASTECTOMY WITH SENTINEL LYMPH NODE BIOPSY;  Surgeon: Haywood Lasso, MD;  Location: [place];  Service: General;  Laterality: Right;  right total mastectomy and sentinel node  . Mastectomy modified radical  06/07/2012    Procedure: MASTECTOMY MODIFIED RADICAL;  Surgeon: Haywood Lasso, MD;  Location: [place];  Service: General;  Laterality: Right;  . Tubal ligation    . Simple mastectomy with axillary sentinel node biopsy Left 09/30/2012    Procedure: SIMPLE MASTECTOMY;  Surgeon: Haywood Lasso, MD;  Location: [place];  Service: General;  Laterality: Left;  left total mastectomy  . Latissimus flap to breast Right 06/06/2013    Procedure: RIGHT LATISSIMUS MYOCUTANEOUS FLAP WITH PLACEMENT OF TISSUE EXPANDER IN RIGHT BREAST ;  Surgeon: Theodoro Kos, DO;  Location: [place];  Service: Plastics;  Laterality: Right;  . Breast reconstruction with placement of tissue expander and flex hd (acellular hydrated dermis) Left 06/06/2013    Procedure: PLACEMENT OF LEFT BREAST TISSUE EXPANDER AND FLEX HD ;  Surgeon: Theodoro Kos, DO;  Location: [place];  Service: Plastics;  Laterality: Left;  . Removal of bilateral tissue expanders with placement of bilateral breast implants Bilateral 11/10/2013    Procedure: REMOVAL OF BILATERAL TISSUE EXPANDERS WITH PLACEMENT OF SILICONE BREAST IMPLANTS WITH BILATERAL CAPSULOTOMIES;  Surgeon: Theodoro Kos, DO;  Location: [place];  Service: Plastics;  Laterality: Bilateral;  . Cosmetic surgery  04/19/14    Dr. Migdalia Dk, mini tummy tuck, smoothed out right breast, left breast lift  .  Dilatation & currettage/hysteroscopy with resectocope N/A 06/12/2014    Procedure: DILATATION & CURETTAGE/HYSTEROSCOPY ;  Surgeon: Lyman Speller, MD;  Location: [facility];  Service: Gynecology;  Laterality: N/A;    FAMILY HISTORY Family History  Problem Relation Age of Onset  . Breast cancer Mother 109  . Lymphoma Mother 58    non-hodgkins lymphoma in her epiglottis  . Cancer Mother     non-hodkins lymphoma, breast  . Hypertension Mother   . Heart disease Mother   . Deep vein thrombosis Mother   . Thyroid disease Mother   . Osteoporosis Mother   . Stroke Maternal Grandfather   . Bipolar disorder Brother   . Gout Brother   . Breast cancer Other     MGM's sister; dx <50  . Breast cancer Other 4    MGF's sister  . Brain cancer Other     MGM's sister; dx <50  . Leukemia Other     mother's maternal cousin; dx under 53  the patient's mother, Mariya Mottley, is also my patient and has a history of breast cancer. The patient's father was adopted. Quilla has 2 brothers,  no sisters. There is no history of ovarian cancer in the family.  GYNECOLOGIC HISTORY: She had menarche age 64, first live birth age 7. She is GX P3. LMP was in 2013, now being evaluated by gynecology and will undergo uterine biopsy due to vaginal spotting.  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 Her husband Elta Guadeloupe works for [employer] in [employer]. Their children are currently 71 years old,  18 (chemistry major at [employer] [place] [employer]), and 50.   ADVANCED DIRECTIVES: Not in place  HEALTH MAINTENANCE: History  Substance Use Topics  . Smoking status: Never Smoker   . Smokeless tobacco: Never Used  . Alcohol Use: 0.5 oz/week    1 Not specified per week     Comment: socially    Colonoscopy: 2012  PAP: unsure of date  Bone density: 08/11/2012 at [facility], osteopenia  Lipid panel: August 2013, Dr. Inis Sizer  Allergies  Allergen Reactions  .  Doxycycline Rash    Current Outpatient Prescriptions  Medication Sig Dispense Refill  . ALPRAZolam (XANAX) 0.5 MG tablet Take 0.5 mg by mouth daily as needed for anxiety.     Marland Kitchen amphetamine-dextroamphetamine (ADDERALL XR) 30 MG 24 hr capsule Take 30 mg by mouth daily.     Marland Kitchen azithromycin (ZITHROMAX Z-PAK) 250 MG tablet Take 2 tablets on day one.  Then take one tablet daily to complete 5 days. 6 tablet 0  . Biotin 10 MG TABS Take 1 tablet by mouth daily.    Marland Kitchen buPROPion (WELLBUTRIN XL) 150 MG 24 hr tablet Take 300 mg by mouth daily.     . Cholecalciferol (VITAMIN D-3) 5000 UNITS TABS Take 5,000 Units by mouth daily.    . Coconut Oil 1000 MG CAPS Take by mouth.    Marland Kitchen L-Methylfolate (DEPLIN) 7.5 MG TABS Take 1 tablet by mouth daily.    Marland Kitchen L-Methylfolate-Algae (DEPLIN 15) 15-90.314 MG CAPS     . Multiple Vitamins-Minerals (MULTIVITAMIN WITH MINERALS) tablet Take 1 tablet by mouth daily.    . traZODone (DESYREL) 50 MG tablet Take 150 mg by mouth at bedtime.    Marland Kitchen VIIBRYD 40 MG TABS Take 40 mg by mouth daily.      No current facility-administered medications for this visit.    OBJECTIVE: Middle-aged white woman who appears well Filed Vitals:   07/06/14 1404  BP: 122/46  Pulse: 74  Temp: 98.1 F (36.7 C)  Resp: 18     Body mass index is 19.92 kg/(m^2).    ECOG FS: 1 Filed Weights   07/06/14 1404  Weight: 116 lb 1.6 oz (52.663 kg)   Sclerae unicteric, pupils equal and reactive Oropharynx clear and moist-- no thrush No cervical or supraclavicular adenopathy Lungs no rales or rhonchi Heart regular rate and rhythm Abd soft, nontender, positive bowel sounds MSK no focal spinal tenderness, no upper extremity lymphedema Neuro: nonfocal, well oriented, appropriate affect Breasts: Status post bilateral mastectomies. There is no evidence of chest wall recurrence. Both axillae are benign.   LAB RESULTS: Lab Results  Component Value Date   WBC 5.2 06/29/2014   NEUTROABS 3.5 06/29/2014   HGB  13.8 06/29/2014   HCT 41.6 06/29/2014   MCV 90.5 06/29/2014   PLT 265 06/29/2014      Chemistry      Component Value Date/Time   NA 143 06/29/2014 1419   NA 143 10/11/2013 2125   K 4.0 06/29/2014 1419   K 4.2 10/11/2013 2125   CL  100 10/11/2013 2125   CL 102 12/27/2012 0935   CO2 30* 06/29/2014 1419   CO2 31 10/11/2013 2125   BUN 19.3 06/29/2014 1419   BUN 17 10/11/2013 2125   CREATININE 0.9 06/29/2014 1419   CREATININE 0.90 10/11/2013 2125      Component Value Date/Time   CALCIUM 9.8 06/29/2014 1419   CALCIUM 9.7 10/11/2013 2125   ALKPHOS 109 06/29/2014 1419   ALKPHOS 92 10/11/2013 2125   AST 28 06/29/2014 1419   AST 27 10/11/2013 2125   ALT 35 06/29/2014 1419   ALT 22 10/11/2013 2125   BILITOT 0.73 06/29/2014 1419   BILITOT 0.3 10/11/2013 2125       Lab Results  Component Value Date   LABCA2 25 06/29/2014     STUDIES: CLINICAL DATA: Stabbing pain under left breast, back pain  EXAM: CT ANGIOGRAPHY CHEST WITH CONTRAST  TECHNIQUE: Multidetector CT imaging of the chest was performed using the standard protocol during bolus administration of intravenous contrast. Multiplanar CT image reconstructions and MIPs were obtained to evaluate the vascular anatomy.  CONTRAST: 64m OMNIPAQUE IOHEXOL 350 MG/ML SOLN  COMPARISON: Prior radiograph from 10/18/2012  FINDINGS: No pathologically enlarged mediastinal, hilar, or axillary lymph nodes are identified.  Aorta and great vessels are within normal limits. No aortic aneurysm or dissection.  Heart size is normal. No pericardial effusion.  Pulmonary arterial tree is well opacified. No filling defect to suggest acute pulmonary embolus. Re-formatted imaging confirms these findings.  The lungs are clear without focal infiltrate, pulmonary edema, or pleural effusion. Subsegmental atelectasis seen dependently within the lower lobes, right greater than left. No pulmonary nodule or mass.  Visualized  upper abdomen is within normal limits.  Postsurgical changes from prior bilateral mastectomies with bilateral breast implants noted.  There is an acute fracture of the right anterior fourth and fifth ribs. Underlying atelectatic changes seen within the right middle lobe. No pneumothorax.  No worrisome lytic or blastic osseous lesions identified.  IMPRESSION: 1. No CT evidence of acute pulmonary embolism. 2. Acute minimally displaced fractures of the right anterior fourth and fifth ribs, just underneath the right breast implant. The right breast implant is intact, and there is no pneumothorax. 3. Sequelae of prior bilateral mastectomies with bilateral breast implants in place.   Electronically Signed  By: BJeannine BogaM.D.  On: 10/11/2013 22:25  CLINICAL DATA: Fullness/ tightness in chest radiating up to neck  EXAM: CLINICAL DATA: Right-sided breast cancer. Left-sided rib fracture x2. Neck and back pain. Pelvic pressure.  EXAM: NUCLEAR MEDICINE WHOLE BODY BONE SCAN  TECHNIQUE: Whole body anterior and posterior images were obtained approximately 3 hours after intravenous injection of radiopharmaceutical.  RADIOPHARMACEUTICALS: 26.6 Technetium-99 MDP  COMPARISON: NM PET IMAGE RESTAG (PS) SKULL BASE TO THIGH dated 12/27/2012; CT ANGIO CHEST W/CM &/OR WO/CM dated 10/11/2013  FINDINGS: Uptake about anterior right ribs which is likely posttraumatic as detailed on recent CT. There is also an area of tracer uptake about either the anterior left fifth or posterior left ninth/tenth rib. This is favored to be posttraumatic. No osseous lesion identified on recent CT. No displaced fracture in this area.  There is also presumed degenerative uptake about the facets at the lumbosacral junction, right wrist, and right forefoot. Right extrarenal pelvis. Left sternoclavicular uptake which is favored to be degenerative, and correlates with findings on recent  CT.  IMPRESSION: 1. No evidence of osseous metastasis. 2. Right and likely left sided posttraumatic rib uptake. 3. Scattered degenerative uptake as detailed above.  Electronically Signed  By: Abigail Miyamoto M.D.  On: 10/19/2013 15:31  CHEST 2 VIEW  COMPARISON: CT scan of the chest 10/11/2013  FINDINGS: Cardiac and mediastinal contours are within normal limits. Surgical clips present within the right axilla. Bilateral breast reconstruction prostheses are present. The lungs are clear. No acute osseous abnormality. Mild degenerative spurring in the mid thoracic spine.  IMPRESSION: No active cardiopulmonary disease.   Electronically Signed  By: Jacqulynn Cadet M.D.  On: 03/28/2014 16:38   ASSESSMENT: 53 y.o. BRCA negative [place] woman status post right modified radical mastectomy 06/07/2012 for a multifocal invasive ductal carcinoma, the largest of 3 masses measuring 3.0 cm, grade 1; a second mass measuring 2.3 cm, grade 2; and a third mass measuring 2.2 cm, grade 1. The 2 larger masses were both 100% estrogen receptor positive, the grade 2 mass being also progesterone receptor positive at 98%, with an MIB-1 of 16% (the larger mass was estrogen receptor negative, with an Mib-1 of 12%). Both showed no HER-2 amplification. 9 of 19 axillary lymph nodes sampled were involved. 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    (5) left simple mastectomy 09/30/2012, with benign pathology; complicated by an infected seroma MRSA infection.  (6) patient is s/p breast reconstruction.   (7) neck pain with CT neck negative for metastatic  disease, also worsening back pain.  (8) history of major depressive disorder, status post transcranial magnetic simulation treatment completed December 2015    PLAN:  Ismahan is doing well from a breast cancer point of view. For unrelated reasons she has had several studies this year including  CT angiography of the chest, a bone scan, ultrasound of the pelvis and a chest x-ray, none of which have shown any evidence of metastatic disease. By history, review of systems, physical exam and labs there is no suggestion of active disease.   This is very favorable. Of course I remain concerned because she received no systemic treatment for her stage III breast cancer. Her risk of recurrence remains high.  I am going to follow her on a yearly basis. If she sees her primary care physician in April and sees [place] in August and then Dr. Sabra Heck in December she would be seeing one of her physicians every 4 months or so and that probably would be optimal.  Accordingly she will return to see [place] August 2016. We will do lab work, including a [place] 27 29 at her request, review of systems and physical exam. We would evaluate any symptom aggressively but otherwise as per national guidelines I would not obtain scans on a routine basis.  She knows to call us for any problems that may develop before her next visit here.      07/06/2014

## 2014-07-07 ENCOUNTER — Other Ambulatory Visit (INDEPENDENT_AMBULATORY_CARE_PROVIDER_SITE_OTHER): Payer: BC Managed Care – PPO | Admitting: *Deleted

## 2014-07-07 VITALS — BP 108/55 | HR 89

## 2014-07-07 DIAGNOSIS — F332 Major depressive disorder, recurrent severe without psychotic features: Secondary | ICD-10-CM

## 2014-07-07 DIAGNOSIS — F329 Major depressive disorder, single episode, unspecified: Secondary | ICD-10-CM | POA: Diagnosis not present

## 2014-07-07 NOTE — Progress Notes (Signed)
Patient ID: Kelly [name], female   DOB: 09/23/1960, 53 y.o.   MRN: [id number] Pt reported to [facility] for Repetitve Transcranial Magnetic Stimulation treatment for Major Depressive Disorder. Pt is currently in the taper down phase of tx. Pt reported no change in medication regimen, alcohol/substance use, caffeine consumption, or metal implant status since previous tx. Pt stated she feels tried as a result of getting only 4 hours of sleep the night of 07/05/14. Pt stated she slept for 7 hours last night. Pt tolerated tx well. %MT remained at 120% for the duration of tx. Pt with no complaints post-tx. Pt departed from clinic without issue.

## 2014-07-08 NOTE — Telephone Encounter (Signed)
none

## 2014-07-10 ENCOUNTER — Other Ambulatory Visit (INDEPENDENT_AMBULATORY_CARE_PROVIDER_SITE_OTHER): Payer: BC Managed Care – PPO | Admitting: *Deleted

## 2014-07-10 VITALS — BP 121/70 | HR 83 | Ht 64.0 in | Wt 117.2 lb

## 2014-07-10 DIAGNOSIS — F332 Major depressive disorder, recurrent severe without psychotic features: Secondary | ICD-10-CM

## 2014-07-10 DIAGNOSIS — F329 Major depressive disorder, single episode, unspecified: Secondary | ICD-10-CM | POA: Diagnosis not present

## 2014-07-10 NOTE — Progress Notes (Signed)
Patient ID: Kelly [name], female   DOB: 1960/09/20, 53 y.o.   MRN: [id number] Pt reported to [facility] for Repetitve Transcranial Magnetic Stimulation treatment for Major Depressive Disorder. Pt reported no change in medication regimen, alcohol/substance use, caffeine consumption, sleep pattern, or metal implant status since previous tx. Pt com[pleted a PHQ-9 with a score of 1, which is no change from the previous week. Pt stated that yesterday she experienced 1 episode of gradual onset of nausea, dizziness, and rapid heartrate. Pt took something for her nausea, which helped, and then went to bed. All symptoms gone upon awaking this morning. Pt stated she has felt lethargic today. Advised pt to seek medical care if these episodes continue. Pt tolerated tx well. %MT remained at 120% for the duration of tx. Pt with no complaints post-tx. Pt departed from clinic without issue.

## 2014-07-14 ENCOUNTER — Other Ambulatory Visit (INDEPENDENT_AMBULATORY_CARE_PROVIDER_SITE_OTHER): Payer: BC Managed Care – PPO | Admitting: *Deleted

## 2014-07-14 VITALS — BP 129/67 | HR 90 | Ht 64.0 in | Wt 115.6 lb

## 2014-07-14 DIAGNOSIS — F332 Major depressive disorder, recurrent severe without psychotic features: Secondary | ICD-10-CM

## 2014-07-14 DIAGNOSIS — F329 Major depressive disorder, single episode, unspecified: Secondary | ICD-10-CM | POA: Diagnosis not present

## 2014-07-14 NOTE — Progress Notes (Signed)
Patient ID: Kelly [name], female   DOB: Feb 15, 1961, 53 y.o.   MRN: [id number] Pt reported to [facility] for Repetitve Transcranial Magnetic Stimulation treatment for Major Depressive Disorder. Pt reported no change in alcohol/substance use, caffeine consumption, or metal implant status since previous tx. Pt stated that she and her husband had an argument last night and she took 0.5 mg of Xanax "to calm down." Pt also stated that she was only able to sleep 5-6 hours last night, because "I had stuff to do so I stayed up late." Pt tearful intermittently throughout tx session, stating that this time of year is hard for her due to the loss of her mother and relational estrangement from her family. Pt completed a PHQ-9 with a score of 0. This is decreased from the pt's previous score of 1. Pt also completed a Beck's Depression Inventory with a score of 0. This is decreased from her previous score of 5 on 06/01/14. Pt reported she saw her psychiatrist since her previous [place] session, who was pleased with her positive response to [place] tx. Pt tolerated tx well. %MT remained at 120% for the duration of tx. Pt with no complaints post-tx. Pt departed from clinic without issue. This is the final tx session in the pt's course of [place] tx. Pt will return to this facility in approximately 30 days for follow up appointment with MD.

## 2014-08-16 ENCOUNTER — Other Ambulatory Visit (HOSPITAL_COMMUNITY): Payer: BC Managed Care – PPO | Admitting: Psychiatry

## 2014-08-22 ENCOUNTER — Other Ambulatory Visit (HOSPITAL_COMMUNITY): Payer: BC Managed Care – PPO | Admitting: Psychiatry

## 2014-08-31 ENCOUNTER — Other Ambulatory Visit (HOSPITAL_COMMUNITY): Payer: BC Managed Care – PPO | Attending: Psychiatry | Admitting: Psychiatry

## 2014-09-15 NOTE — Telephone Encounter (Signed)
None

## 2014-09-18 ENCOUNTER — Telehealth (HOSPITAL_COMMUNITY): Payer: Self-pay | Admitting: *Deleted

## 2014-09-18 NOTE — Telephone Encounter (Signed)
Called pt in response to her email inquiring about a booster series of [place] sessions. Pt complains of worsening depressive symptoms due to circumstances in her life causing her stress, including family and financial stress. Pt stated that her symptoms began worsening "a few weeks ago." Writer administered a PHQ-9 over the phone, with the pt scoring an 11 (moderate depression). This is increased from the pt's score of 0 on 09/01/14 when she came in for follow up. Pt reported that she doesn't have an appointment with her psychiatrist in the near future. Writer suggested that the pt call her psychiatrist's office to move her appointment up. Writer also suggested that the pt schedule an appointment for psychotherapy, which the pt has yet to do. Pt said she would. Pt reported passive suicidal ideation due to her increasing stress level and depression. Pt denied plan or intent. Pt contracted for safety, agreeing to call 911 or go to the nearest ED in the event her SI increases. Will discus this case with MD tomorrow to re: pt's eligibility for boost course of [place] tx.

## 2014-09-21 ENCOUNTER — Telehealth (HOSPITAL_COMMUNITY): Payer: Self-pay | Admitting: *Deleted

## 2014-09-21 NOTE — Telephone Encounter (Signed)
Called pt to update her on Education officer, museum had with provider re: booster series of [place] tx sessions due to pt's recent increase in depressive symptoms. Informed pt of MD's decision that, since her current symptoms appear highly situational in nature, further TMS should not be pursued at this time. Informed pt of MD's suggestion to schedule an appointment for psychotherapy, as well as schedule a sooner appointment with her prescribing psychiatrist. Pt agreed with MD's decision and informed writer that she began psychotherapy yesterday, and she believes this will help her a great deal. Pt stated she has an appointment with her prescribing psychiatrist next week, so she plans to keep that appointment. Will follow up with pt again via telephone in approximately 1 month.

## 2014-10-17 ENCOUNTER — Ambulatory Visit (INDEPENDENT_AMBULATORY_CARE_PROVIDER_SITE_OTHER): Payer: BLUE CROSS/BLUE SHIELD | Admitting: Obstetrics & Gynecology

## 2014-10-17 ENCOUNTER — Encounter: Payer: Self-pay | Admitting: Obstetrics & Gynecology

## 2014-10-17 VITALS — BP 122/78 | HR 68 | Resp 12 | Ht 64.5 in | Wt 119.0 lb

## 2014-10-17 DIAGNOSIS — Z0000 Encounter for general adult medical examination without abnormal findings: Secondary | ICD-10-CM | POA: Diagnosis not present

## 2014-10-17 DIAGNOSIS — Z124 Encounter for screening for malignant neoplasm of cervix: Secondary | ICD-10-CM

## 2014-10-17 DIAGNOSIS — Z01419 Encounter for gynecological examination (general) (routine) without abnormal findings: Secondary | ICD-10-CM | POA: Diagnosis not present

## 2014-10-17 LAB — COMPREHENSIVE METABOLIC PANEL
ALT: 36 U/L — ABNORMAL HIGH (ref 0–35)
AST: 41 U/L — ABNORMAL HIGH (ref 0–37)
Albumin: 4.5 g/dL (ref 3.5–5.2)
Alkaline Phosphatase: 99 U/L (ref 39–117)
BUN: 27 mg/dL — ABNORMAL HIGH (ref 6–23)
CO2: 33 mEq/L — ABNORMAL HIGH (ref 19–32)
Calcium: 9.5 mg/dL (ref 8.4–10.5)
Chloride: 101 mEq/L (ref 96–112)
Creat: 0.93 mg/dL (ref 0.50–1.10)
Glucose, Bld: 87 mg/dL (ref 70–99)
Potassium: 4.1 mEq/L (ref 3.5–5.3)
Sodium: 139 mEq/L (ref 135–145)
Total Bilirubin: 0.4 mg/dL (ref 0.2–1.2)
Total Protein: 7.2 g/dL (ref 6.0–8.3)

## 2014-10-17 LAB — POCT URINALYSIS DIPSTICK
Bilirubin, UA: NEGATIVE
Blood, UA: NEGATIVE
Glucose, UA: NEGATIVE
Ketones, UA: NEGATIVE
Leukocytes, UA: NEGATIVE
Nitrite, UA: NEGATIVE
Protein, UA: NEGATIVE
Urobilinogen, UA: NEGATIVE
pH, UA: 5

## 2014-10-17 LAB — CBC
HCT: 43.5 % (ref 36.0–46.0)
Hemoglobin: 14.5 g/dL (ref 12.0–15.0)
MCH: 30.3 pg (ref 26.0–34.0)
MCHC: 33.3 g/dL (ref 30.0–36.0)
MCV: 90.8 fL (ref 78.0–100.0)
MPV: 9.3 fL (ref 8.6–12.4)
Platelets: 267 10*3/uL (ref 150–400)
RBC: 4.79 MIL/uL (ref 3.87–5.11)
RDW: 13.5 % (ref 11.5–15.5)
WBC: 5.9 10*3/uL (ref 4.0–10.5)

## 2014-10-17 LAB — LIPID PANEL
Cholesterol: 202 mg/dL — ABNORMAL HIGH (ref 0–200)
HDL: 64 mg/dL (ref >=46)
LDL Cholesterol: 103 mg/dL — ABNORMAL HIGH (ref 0–99)
Total CHOL/HDL Ratio: 3.2 Ratio
Triglycerides: 174 mg/dL — ABNORMAL HIGH (ref <150)
VLDL: 35 mg/dL (ref 0–40)

## 2014-10-17 LAB — TSH: TSH: 1.291 u[IU]/mL (ref 0.350–4.500)

## 2014-10-17 NOTE — Progress Notes (Signed)
54 y.o. [id number] MarriedCaucasianF here for annual exam.  Doing well.  Reports having a little spotting.  Denies pelvic pain.    Patient's last menstrual period was 03/28/2012 (approximate).          Sexually active: Yes.    The current method of family planning is tubal ligation.    Exercising: Yes.    yoga and cardio Smoker:  no  Health Maintenance: Pap:  10/10/13 WNL/negative HR HPV History of abnormal Pap:  yes MMG:  2013 MMG, and Breast MRI-bilateral mastectomy Colonoscopy:  1/13, Dr. Collene Mares.  Repeat 1 years BMD:   08/11/12  TDaP:  10/10/13 Screening Labs: will do next year, Hb today: 13.8 with Dr. Jana Hakim, Urine today: negative   reports that she has never smoked. She has never used smokeless tobacco. She reports that she drinks alcohol. She reports that she does not use illicit drugs.  Past Medical History  Diagnosis Date  . Anxiety   . Depression   . Breast cancer 05/05/12    right, ER +, PR -, Her 2 -  . Arthritis   . History of radiation therapy 07/14/2012-09/03/12    right chest wall 59.4 gray  . H/O dizziness   . Abnormal Pap smear of cervix     with conization  . Sleep apnea     cpap-has not been using-doing well without it    Past Surgical History  Procedure Laterality Date  . Cesarean section      x3  . Joint replacement      Rt and Lt thumbs  . Cervical conization w/bx    . Breast surgery      several biopsies, needle aspiration  . Mastectomy w/ sentinel node biopsy  06/07/2012    Procedure: MASTECTOMY WITH SENTINEL LYMPH NODE BIOPSY;  Surgeon: Haywood Lasso, MD;  Location: [place];  Service: General;  Laterality: Right;  right total mastectomy and sentinel node  . Mastectomy modified radical  06/07/2012    Procedure: MASTECTOMY MODIFIED RADICAL;  Surgeon: Haywood Lasso, MD;  Location: [place];  Service: General;  Laterality: Right;  . Tubal ligation    . Simple mastectomy with axillary sentinel node biopsy Left 09/30/2012    Procedure: SIMPLE MASTECTOMY;   Surgeon: Haywood Lasso, MD;  Location: [place];  Service: General;  Laterality: Left;  left total mastectomy  . Latissimus flap to breast Right 06/06/2013    Procedure: RIGHT LATISSIMUS MYOCUTANEOUS FLAP WITH PLACEMENT OF TISSUE EXPANDER IN RIGHT BREAST ;  Surgeon: Theodoro Kos, DO;  Location: [place];  Service: Plastics;  Laterality: Right;  . Breast reconstruction with placement of tissue expander and flex hd (acellular hydrated dermis) Left 06/06/2013    Procedure: PLACEMENT OF LEFT BREAST TISSUE EXPANDER AND FLEX HD ;  Surgeon: Theodoro Kos, DO;  Location: [place];  Service: Plastics;  Laterality: Left;  . Removal of bilateral tissue expanders with placement of bilateral breast implants Bilateral 11/10/2013    Procedure: REMOVAL OF BILATERAL TISSUE EXPANDERS WITH PLACEMENT OF SILICONE BREAST IMPLANTS WITH BILATERAL CAPSULOTOMIES;  Surgeon: Theodoro Kos, DO;  Location: [place];  Service: Plastics;  Laterality: Bilateral;  . Cosmetic surgery  04/19/14    Dr. Migdalia Dk, mini tummy tuck, smoothed out right breast, left breast lift  . Dilatation & currettage/hysteroscopy with resectocope N/A 06/12/2014    Procedure: DILATATION & CURETTAGE/HYSTEROSCOPY ;  Surgeon: Lyman Speller, MD;  Location: [facility];  Service: Gynecology;  Laterality: N/A;  Current Outpatient Prescriptions  Medication Sig Dispense Refill  . ALPRAZolam (XANAX) 0.5 MG tablet Take 0.5 mg by mouth daily as needed for anxiety.     Marland Kitchen amphetamine-dextroamphetamine (ADDERALL XR) 20 MG 24 hr capsule   0  . Biotin 10 MG TABS Take 1 tablet by mouth daily.    Marland Kitchen buPROPion (WELLBUTRIN XL) 150 MG 24 hr tablet Take 300 mg by mouth daily.     Marland Kitchen L-Methylfolate-Algae (DEPLIN 15) 15-90.314 MG CAPS     . Multiple Vitamins-Minerals (MULTIVITAMIN WITH MINERALS) tablet Take 1 tablet by mouth daily.    . NON FORMULARY Pure Essence Labs-longevity womens formula    . NON FORMULARY Pure Essence Labs-Adren 2  capsules daily    . NON FORMULARY Cordyceps 500mg  daily    . NON FORMULARY Ashwagandha 4% with anolides 300mg     . NON FORMULARY Holy Basil 300mg     . NON FORMULARY eleuthero root 100mg     . NON FORMULARY schizandra fruit 100mg     . traZODone (DESYREL) 50 MG tablet Take 150 mg by mouth at bedtime.    Marland Kitchen VIIBRYD 40 MG TABS Take 40 mg by mouth daily.      No current facility-administered medications for this visit.    Family History  Problem Relation Age of Onset  . Breast cancer Mother 66  . Lymphoma Mother 75    non-hodgkins lymphoma in her epiglottis  . Cancer Mother     non-hodkins lymphoma, breast  . Hypertension Mother   . Heart disease Mother   . Deep vein thrombosis Mother   . Thyroid disease Mother   . Osteoporosis Mother   . Stroke Maternal Grandfather   . Bipolar disorder Brother   . Gout Brother   . Breast cancer Other     MGM's sister; dx <50  . Breast cancer Other 62    MGF's sister  . Brain cancer Other     MGM's sister; dx <50  . Leukemia Other     mother's maternal cousin; dx under 10    ROS:  Pertinent items are noted in HPI.  Otherwise, a comprehensive ROS was negative.  Exam:   BP 122/78 mmHg  Pulse 68  Resp 12  Ht 5' 4.5" (1.638 m)  Wt 119 lb (53.978 kg)  BMI 20.12 kg/m2  LMP 03/28/2012 (Approximate)  Weight change: @WEIGHTCHANGE @ Height:   Height: 5' 4.5" (163.8 cm)  Ht Readings from Last 3 Encounters:  10/17/14 5' 4.5" (1.638 m)  07/14/14 5\' 4"  (1.626 m)  07/10/14 5\' 4"  (1.626 m)    General appearance: alert, cooperative and appears stated age Head: Normocephalic, without obvious abnormality, atraumatic Neck: no adenopathy, supple, symmetrical, trachea midline and thyroid normal to inspection and palpation Lungs: clear to auscultation bilaterally Breasts:   Heart: regular rate and rhythm Abdomen: soft, non-tender; bowel sounds normal; no masses,  no organomegaly Extremities: extremities normal, atraumatic, no cyanosis or edema Skin:  Skin color, texture, turgor normal. No rashes or lesions Lymph nodes: Cervical, supraclavicular, and axillary nodes normal. No abnormal inguinal nodes palpated Neurologic: Grossly normal   Pelvic: External genitalia:  no lesions              Urethra:  normal appearing urethra with no masses, tenderness or lesions              Bartholins and Skenes: normal                 Vagina: normal appearing vagina with normal color  and discharge, no lesions              Cervix: no lesions              Pap taken: No. Bimanual Exam:  Uterus:  normal size, contour, position, consistency, mobility, non-tender              Adnexa: normal adnexa and no mass, fullness, tenderness               Rectovaginal: Confirms               Anus:  normal sphincter tone, no lesions  Chaperone was present for exam.  A:  Well Woman with normal exam PMP no HRT H/O ER+ breast cancer s/p Right mastectomy with 3+ LN/chemo/radiation 11/13 and s/p left simple mastectomy 3/14. Mother with hx of breast cancer OSA Anxiety/Depression H/O abnormal pap s/p conization 1998  P: Mammograms not needed.  pap smear today.  Pap with neg HR HPV testing 3/15. She requests screening labs today:  CMP, Lipids, Vit D, TSH, CBC return annually or prn

## 2014-10-18 LAB — VITAMIN D 25 HYDROXY (VIT D DEFICIENCY, FRACTURES): Vit D, 25-Hydroxy: 39 ng/mL (ref 30–100)

## 2014-10-19 LAB — IPS PAP TEST WITH REFLEX TO HPV

## 2014-10-25 ENCOUNTER — Other Ambulatory Visit: Payer: Self-pay | Admitting: Family Medicine

## 2014-10-25 DIAGNOSIS — R945 Abnormal results of liver function studies: Principal | ICD-10-CM

## 2014-10-25 DIAGNOSIS — Z853 Personal history of malignant neoplasm of breast: Secondary | ICD-10-CM

## 2014-10-25 DIAGNOSIS — R7989 Other specified abnormal findings of blood chemistry: Secondary | ICD-10-CM

## 2014-10-26 ENCOUNTER — Ambulatory Visit
Admission: RE | Admit: 2014-10-26 | Discharge: 2014-10-26 | Disposition: A | Discharge status: Home / Self Care | Payer: BLUE CROSS/BLUE SHIELD | Source: Ambulatory Visit | Attending: Family Medicine | Admitting: Family Medicine

## 2014-10-26 DIAGNOSIS — R945 Abnormal results of liver function studies: Principal | ICD-10-CM

## 2014-10-26 DIAGNOSIS — R7989 Other specified abnormal findings of blood chemistry: Secondary | ICD-10-CM

## 2014-10-26 DIAGNOSIS — Z853 Personal history of malignant neoplasm of breast: Secondary | ICD-10-CM

## 2014-10-26 MED ORDER — IOPAMIDOL (ISOVUE-300) INJECTION 61%
100.0000 mL | Freq: Once | INTRAVENOUS | Status: AC | PRN
Start: 2014-10-26 — End: 2014-10-26

## 2014-10-27 ENCOUNTER — Other Ambulatory Visit: Payer: Self-pay

## 2014-11-13 ENCOUNTER — Telehealth (HOSPITAL_COMMUNITY): Payer: Self-pay | Admitting: *Deleted

## 2014-11-13 NOTE — Telephone Encounter (Signed)
Called pt to follow up re: depression severity. Administered PHQ-9 over the phone, with pt scoring a 0 (remission). Pt reported "I am taking better care of myself." Pt is no longer in psychotherapy due to insurance issues. Pt stated she is seeing someone for accupuncture and holistic care, which she feels is helping a great deal with her depression. Will contact pt in June 2016 for 6 month follow up associated with after care follow up for Transcranial Magnetic Stimulation treatment for Major Depressive Disorder. Encouraged pt to call clinic prn if needs arise prior to next scheduled follow up call. Pt said she would.

## 2015-01-02 ENCOUNTER — Encounter: Payer: Self-pay | Admitting: Genetic Counselor

## 2015-01-02 DIAGNOSIS — C50911 Malignant neoplasm of unspecified site of right female breast: Secondary | ICD-10-CM

## 2015-01-02 NOTE — Progress Notes (Signed)
Patient showed up yesterday wanting to get more information about risks for cancer or neurological conditions in the offspring of individuals exposed to water contamination at [facility].  She is very concerned about the risk for disease in her children and wanted to test them.  I related that she was negative for hereditary conditions and therefore could not offer testing to her children as the chance of them testing positive for anything is low, based on her testing.  She asked that I look into other options for her to consider for testing.  I explained that I am happy to make a couple phone calls to the [facility] group, which she provided me, to see what they may have to offer, but that this type of request was beyond the scope of genetic testing and genetic counseling for hereditary disease.  Patient reports that she was at [facility] between 1963 and early 1970s.  Her mother, Kelly [name]) was diagnosed with breast cancer and non-hodgkin's lymphoma and was part of a class action lawsuit until her January 12, 2014.  The patient's biological father, Kelly [name] is currently suffering from Parkinson's disease.  Kelly [name] explained that while her mother was part of this class action lawsuit, that she is not eligible to join it.  Called [employer] today and left a message for Vivia Birmingham, the person who does new client intakes to call me back.

## 2015-01-08 ENCOUNTER — Telehealth: Payer: Self-pay | Admitting: Genetic Counselor

## 2015-01-08 ENCOUNTER — Encounter: Payer: Self-pay | Admitting: Genetic Counselor

## 2015-01-08 DIAGNOSIS — C50911 Malignant neoplasm of unspecified site of right female breast: Secondary | ICD-10-CM

## 2015-01-08 NOTE — Progress Notes (Signed)
Spoke with Margarita Grizzle at [employer]. She stated that patient's case is closed, but that she can call to see if she can open it up again.  It is coming close to the statues of limitations of when she can reopen her case.  There are no clear connections between exposures from [facility] and the offspring of individuals unless the offspring were exposed inutero or were born there.  Margarita Grizzle, suggested that the patient get onto the web site [employer] to get more information and sign up for newsletters that summarize new studies.  Patient can call [phone] to discuss further.

## 2015-01-08 NOTE — Telephone Encounter (Signed)
Mailbox is full and is not accepting additional incoming calls.

## 2015-03-08 ENCOUNTER — Other Ambulatory Visit: Payer: Self-pay | Admitting: Nurse Practitioner

## 2015-03-08 DIAGNOSIS — C50911 Malignant neoplasm of unspecified site of right female breast: Secondary | ICD-10-CM

## 2015-03-12 ENCOUNTER — Ambulatory Visit (HOSPITAL_BASED_OUTPATIENT_CLINIC_OR_DEPARTMENT_OTHER): Payer: 59 | Admitting: Nurse Practitioner

## 2015-03-12 ENCOUNTER — Telehealth: Payer: Self-pay | Admitting: Nurse Practitioner

## 2015-03-12 ENCOUNTER — Other Ambulatory Visit (HOSPITAL_BASED_OUTPATIENT_CLINIC_OR_DEPARTMENT_OTHER): Payer: 59

## 2015-03-12 ENCOUNTER — Encounter: Payer: Self-pay | Admitting: Nurse Practitioner

## 2015-03-12 VITALS — BP 105/61 | HR 76 | Temp 98.6°F | Resp 18 | Ht 64.5 in | Wt 121.1 lb

## 2015-03-12 DIAGNOSIS — Z853 Personal history of malignant neoplasm of breast: Secondary | ICD-10-CM | POA: Diagnosis not present

## 2015-03-12 DIAGNOSIS — R300 Dysuria: Secondary | ICD-10-CM | POA: Diagnosis not present

## 2015-03-12 DIAGNOSIS — C50911 Malignant neoplasm of unspecified site of right female breast: Secondary | ICD-10-CM

## 2015-03-12 LAB — CBC WITH DIFFERENTIAL/PLATELET
BASO%: 0.5 % (ref 0.0–2.0)
Basophils Absolute: 0 10*3/uL (ref 0.0–0.1)
EOS%: 2.7 % (ref 0.0–7.0)
Eosinophils Absolute: 0.1 10*3/uL (ref 0.0–0.5)
HCT: 42 % (ref 34.8–46.6)
HGB: 14.2 g/dL (ref 11.6–15.9)
LYMPH%: 22.1 % (ref 14.0–49.7)
MCH: 30.9 pg (ref 25.1–34.0)
MCHC: 33.8 g/dL (ref 31.5–36.0)
MCV: 91.4 fL (ref 79.5–101.0)
MONO#: 0.3 10*3/uL (ref 0.1–0.9)
MONO%: 6.8 % (ref 0.0–14.0)
NEUT#: 3 10*3/uL (ref 1.5–6.5)
NEUT%: 67.9 % (ref 38.4–76.8)
Platelets: 206 10*3/uL (ref 145–400)
RBC: 4.6 10*6/uL (ref 3.70–5.45)
RDW: 13.3 % (ref 11.2–14.5)
WBC: 4.4 10*3/uL (ref 3.9–10.3)
lymph#: 1 10*3/uL (ref 0.9–3.3)

## 2015-03-12 LAB — COMPREHENSIVE METABOLIC PANEL (CC13)
ALT: 30 U/L (ref 0–55)
AST: 29 U/L (ref 5–34)
Albumin: 3.9 g/dL (ref 3.5–5.0)
Alkaline Phosphatase: 90 U/L (ref 40–150)
Anion Gap: 9 mEq/L (ref 3–11)
BUN: 19.1 mg/dL (ref 7.0–26.0)
CO2: 28 mEq/L (ref 22–29)
Calcium: 9.3 mg/dL (ref 8.4–10.4)
Chloride: 105 mEq/L (ref 98–109)
Creatinine: 1 mg/dL (ref 0.6–1.1)
EGFR: 66 mL/min/{1.73_m2} — ABNORMAL LOW (ref >90)
Glucose: 119 mg/dl (ref 70–140)
Potassium: 3.8 mEq/L (ref 3.5–5.1)
Sodium: 142 mEq/L (ref 136–145)
Total Bilirubin: 0.55 mg/dL (ref 0.20–1.20)
Total Protein: 6.9 g/dL (ref 6.4–8.3)

## 2015-03-12 LAB — URINALYSIS, MICROSCOPIC - CHCC
Bilirubin (Urine): NEGATIVE
Blood: NEGATIVE
Glucose: NEGATIVE mg/dL
Ketones: NEGATIVE mg/dL
Leukocyte Esterase: NEGATIVE
Nitrite: NEGATIVE
Protein: NEGATIVE mg/dL
Specific Gravity, Urine: 1.03 (ref 1.003–1.035)
Urobilinogen, UR: 0.2 mg/dL (ref 0.2–1)
pH: 5 (ref 4.6–8.0)

## 2015-03-12 LAB — CANCER ANTIGEN 27.29: CA 27.29: 20 U/mL (ref 0–39)

## 2015-03-12 NOTE — Telephone Encounter (Signed)
Gave avs & calendar for Augsut 2017

## 2015-03-12 NOTE — Progress Notes (Signed)
. Kelly [name]   DOB: Aug 06, 1960  MR#: [id number]  CSN#:[id number]  PCP:  Darcus Austin GYN: Lyman Speller, SU: Neldon Mc OTHER MD: Gery Pray, Gypsy Balsam, Claire Sanger, Rupinder Toy Care, Arbie Cookey  CHIEF COMPLAINT: follow up of right breast cancer  CURRENT TREATMENT: Observation  BREAST CANCER HISTORY:  From the prior summary:  The patient herself noted a change in her right breast. She brought it to her primary care physician, Dr. Carron Brazen attention, and diagnostic mammography was obtained at [facility] 05/05/2012. The breasts were found to be heterogeneously dense. There was a spiculated mass posteriorly and laterally to the nipple line in the right breast, and a second mass more medially. There was an asymmetric soft tissue density in the upper outer quadrant of the left breast, without any discrete mass identified. There was a cluster of microcalcifications medially in the left breast. Ultrasound of the right breast showed a lobulated mass measuring 2.3 cm and a second mass measuring 4.5 cm. There was also a third mass measuring 1.9 cm which was felt possibly to represent a lymph node. In the right axilla there was a suspicious lymph node measuring 1.1 cm.  Biopsy of this 3 breast masses was obtained the same day, and showed all 3 to represent invasive ductal carcinoma, grade 1. Because the 3 lesions were morphologically similar, only one prognostic panel was obtained (from the mass at "7:00"), showing it to be estrogen receptor 100% positive, progesterone receptor negative, with an MIB-1 of 12%, and no HER-2 amplification ([id number]).  Bilateral breast MRI was obtained 05/10/2012, and confirmed the 3 right breast masses in question, measuring 2.1, 2.8 (at the 7:00 position) and 2.2 cm. In addition a 1.3 cm right axillary lymph node was noted. There were no suspicious findings in the left breast. On October 30 fine-needle aspiration of the suspicious right  axilla lymph node was performed, with the results being negative ([id number]).   Finally on 06/07/2012 the patient underwent right modified radical mastectomy. The three masses in the right breast measured 2.2 cm, 3 cm, and 2.3 cm ([id number]). The 2.3 cm mass was morphologically slightly different (grade 2) and a prognostic profile was obtained from this mass ("superior mid-medial"). It was 100% estrogen and 98% progesterone receptor positive. MIB-1 was 16%. There was no HER-2 amplification. A total of 19 lymph nodes were sampled, of which 9 were positive. Margins were negative, but close (1 mm). The patient's subsequent history is as detailed below.  INTERVAL HISTORY: Kelly [name] returns today for follow-up of her breast cancer. She continues on observation alone, as she declines antiestrogen therapy. The interval history is generally unremarkable.  REVIEW OF SYSTEMS: Dystany denies fevers, chills nausea, or vomiting. She has no changes in bowel or bladder habits, but is concerned about her urine as she has had frequent UTIs. She denies shortness of breath, chest pain or cough, but has occasional palpitations. She continues to workout regularly attending yoga and martial arts classes. She has some occasional hot flashes. She has generalized joint pain and lower back pain. She denies headaches, dizziness, or vision changes. A detailed review of systems is otherwise stable.  PAST MEDICAL HISTORY: Past Medical History  Diagnosis Date  . Anxiety   . Depression   . Breast cancer 05/05/12    right, ER +, PR -, Her 2 -  . Arthritis   . History of radiation therapy 07/14/2012-09/03/12    right chest wall 59.4 gray  . H/O dizziness   .  Abnormal Pap smear of cervix     with conization  . Sleep apnea     cpap-has not been using-doing well without it   sleep apnea; migraines;  PAST SURGICAL HISTORY: Past Surgical History  Procedure Laterality Date  . Cesarean section      x3  . Joint replacement       Rt and Lt thumbs  . Cervical conization w/bx    . Breast surgery      several biopsies, needle aspiration  . Mastectomy w/ sentinel node biopsy  06/07/2012    Procedure: MASTECTOMY WITH SENTINEL LYMPH NODE BIOPSY;  Surgeon: Haywood Lasso, MD;  Location: [place];  Service: General;  Laterality: Right;  right total mastectomy and sentinel node  . Mastectomy modified radical  06/07/2012    Procedure: MASTECTOMY MODIFIED RADICAL;  Surgeon: Haywood Lasso, MD;  Location: [place];  Service: General;  Laterality: Right;  . Tubal ligation    . Simple mastectomy with axillary sentinel node biopsy Left 09/30/2012    Procedure: SIMPLE MASTECTOMY;  Surgeon: Haywood Lasso, MD;  Location: [place];  Service: General;  Laterality: Left;  left total mastectomy  . Latissimus flap to breast Right 06/06/2013    Procedure: RIGHT LATISSIMUS MYOCUTANEOUS FLAP WITH PLACEMENT OF TISSUE EXPANDER IN RIGHT BREAST ;  Surgeon: Theodoro Kos, DO;  Location: [place];  Service: Plastics;  Laterality: Right;  . Breast reconstruction with placement of tissue expander and flex hd (acellular hydrated dermis) Left 06/06/2013    Procedure: PLACEMENT OF LEFT BREAST TISSUE EXPANDER AND FLEX HD ;  Surgeon: Theodoro Kos, DO;  Location: [place];  Service: Plastics;  Laterality: Left;  . Removal of bilateral tissue expanders with placement of bilateral breast implants Bilateral 11/10/2013    Procedure: REMOVAL OF BILATERAL TISSUE EXPANDERS WITH PLACEMENT OF SILICONE BREAST IMPLANTS WITH BILATERAL CAPSULOTOMIES;  Surgeon: Theodoro Kos, DO;  Location: [place];  Service: Plastics;  Laterality: Bilateral;  . Cosmetic surgery  04/19/14    Dr. Migdalia Dk, mini tummy tuck, smoothed out right breast, left breast lift  . Dilatation & currettage/hysteroscopy with resectocope N/A 06/12/2014    Procedure: DILATATION & CURETTAGE/HYSTEROSCOPY ;  Surgeon: Lyman Speller, MD;  Location: [facility];  Service: Gynecology;   Laterality: N/A;    FAMILY HISTORY Family History  Problem Relation Age of Onset  . Breast cancer Mother 22  . Lymphoma Mother 45    non-hodgkins lymphoma in her epiglottis  . Cancer Mother     non-hodkins lymphoma, breast  . Hypertension Mother   . Heart disease Mother   . Deep vein thrombosis Mother   . Thyroid disease Mother   . Osteoporosis Mother   . Stroke Maternal Grandfather   . Bipolar disorder Brother   . Gout Brother   . Breast cancer Other     MGM's sister; dx <50  . Breast cancer Other 89    MGF's sister  . Brain cancer Other     MGM's sister; dx <50  . Leukemia Other     mother's maternal cousin; dx under 9  the patient's mother, Jaella Weinert, is also my patient and has a history of breast cancer. The patient's father was adopted. Silver has 2 brothers, no sisters. There is no history of ovarian cancer in the family.  GYNECOLOGIC HISTORY: She had menarche age 41, first live birth age 3. She is GX P3. LMP was in 2013, now being evaluated by gynecology and will  undergo uterine biopsy due to vaginal spotting.  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 Her husband Elta Guadeloupe works for [employer] in [employer]. Their children are currently 80 years old,  5 (chemistry major at [place] [employer]), and 23.   ADVANCED DIRECTIVES: Not in place  HEALTH MAINTENANCE: Social History  Substance Use Topics  . Smoking status: Never Smoker   . Smokeless tobacco: Never Used  . Alcohol Use: 0.0 oz/week    0 Standard drinks or equivalent per week     Comment: socially    Colonoscopy: 2012  PAP: unsure of date  Bone density: 08/11/2012 at [facility], osteopenia  Lipid panel: August 2013, Dr. Inis Sizer  Allergies  Allergen Reactions  . Doxycycline Rash    Current Outpatient Prescriptions  Medication Sig Dispense Refill  . ALPRAZolam (XANAX) 0.5 MG tablet Take 0.5 mg by mouth daily as needed for anxiety.      Marland Kitchen amphetamine-dextroamphetamine (ADDERALL XR) 20 MG 24 hr capsule   0  . Biotin 10 MG TABS Take 1 tablet by mouth daily.    Marland Kitchen buPROPion (WELLBUTRIN XL) 150 MG 24 hr tablet Take 300 mg by mouth daily.     Marland Kitchen L-Methylfolate-Algae (DEPLIN 15) 15-90.314 MG CAPS     . Multiple Vitamins-Minerals (MULTIVITAMIN WITH MINERALS) tablet Take 1 tablet by mouth daily.    . NON FORMULARY Pure Essence Labs-longevity womens formula    . NON FORMULARY Pure Essence Labs-Adren 2 capsules daily    . traZODone (DESYREL) 50 MG tablet Take 150 mg by mouth at bedtime.    Marland Kitchen VIIBRYD 40 MG TABS Take 40 mg by mouth daily.      No current facility-administered medications for this visit.    OBJECTIVE: Middle-aged white woman who appears well Filed Vitals:   03/12/15 0901  BP: 105/61  Pulse: 76  Temp: 98.6 F (37 C)  Resp: 18     Body mass index is 20.47 kg/(m^2).    ECOG FS: 1 Filed Weights   03/12/15 0901  Weight: 121 lb 1.6 oz (54.931 kg)   Skin: warm, dry  HEENT: sclerae anicteric, conjunctivae pink, oropharynx clear. No thrush or mucositis.  Lymph Nodes: No cervical or supraclavicular lymphadenopathy  Lungs: clear to auscultation bilaterally, no rales, wheezes, or rhonci  Heart: regular rate and rhythm  Abdomen: round, soft, non tender, positive bowel sounds  Musculoskeletal: No focal spinal tenderness, no peripheral edema  Neuro: non focal, well oriented, positive affect  Breasts: bilateral breasts status post mastectomies and implant reconstruction. No evidence of recurrent disease. Bilateral axillae benign.   LAB RESULTS: Lab Results  Component Value Date   WBC 4.4 03/12/2015   NEUTROABS 3.0 03/12/2015   HGB 14.2 03/12/2015   HCT 42.0 03/12/2015   MCV 91.4 03/12/2015   PLT 206 03/12/2015      Chemistry      Component Value Date/Time   NA 142 03/12/2015 0844   NA 139 10/17/2014 1555   K 3.8 03/12/2015 0844   K 4.1 10/17/2014 1555   CL 101 10/17/2014 1555   CL 102 12/27/2012 0935    CO2 28 03/12/2015 0844   CO2 33* 10/17/2014 1555   BUN 19.1 03/12/2015 0844   BUN 27* 10/17/2014 1555   CREATININE 1.0 03/12/2015 0844   CREATININE 0.93 10/17/2014 1555   CREATININE 0.90 10/11/2013 2125      Component Value Date/Time   CALCIUM 9.3 03/12/2015 0844   CALCIUM 9.5 10/17/2014 1555  ALKPHOS 90 03/12/2015 0844   ALKPHOS 99 10/17/2014 1555   AST 29 03/12/2015 0844   AST 41* 10/17/2014 1555   ALT 30 03/12/2015 0844   ALT 36* 10/17/2014 1555   BILITOT 0.55 03/12/2015 0844   BILITOT 0.4 10/17/2014 1555       Lab Results  Component Value Date   LABCA2 25 06/29/2014     STUDIES: No results found.  ASSESSMENT: 54 y.o. BRCA negative [place] woman status post right modified radical mastectomy 06/07/2012 for a multifocal invasive ductal carcinoma, the largest of 3 masses measuring 3.0 cm, grade 1; a second mass measuring 2.3 cm, grade 2; and a third mass measuring 2.2 cm, grade 1. The 2 larger masses were both 100% estrogen receptor positive, the grade 2 mass being also progesterone receptor positive at 98%, with an MIB-1 of 16% (the larger mass was estrogen receptor negative, with an Mib-1 of 12%). Both showed no HER-2 amplification. 9 of 19 axillary lymph nodes sampled were involved. 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    (5) left simple mastectomy 09/30/2012, with benign pathology; complicated by an infected seroma MRSA infection.  (6) patient is s/p breast reconstruction.   (7) neck pain with CT neck negative for metastatic disease, also worsening back pain.  (8) history of major depressive disorder, status post transcranial  magnetic simulation treatment completed December 2015    PLAN:  Keyleigh is dong well as far as her breast cancer is concerned she is now 2 years out from her definitive surgery with no evidence of recurrent disease. She continues to decline antiestrogen therapy, despite her risk of recurrence as a stage III breast cancer patient. The labs were reviewed in detail and were stable. A urinalysis was drawn and was entirely normal.   Javeah will return in 1 year for labs and a follow up visit. She will alternate visits with her PCP and GYN, so she will be seen about every 4-5 months by someone. She understands and agrees with this plan. She has been encouraged to call with any issues that might arise before her next visit here.  Total time spent in appointment was 25 minutes, with greater than 50% of the time spent face to face with the patient.   03/12/2015

## 2016-01-03 ENCOUNTER — Ambulatory Visit (INDEPENDENT_AMBULATORY_CARE_PROVIDER_SITE_OTHER): Payer: 59 | Admitting: Obstetrics & Gynecology

## 2016-01-03 ENCOUNTER — Encounter: Payer: Self-pay | Admitting: Obstetrics & Gynecology

## 2016-01-03 VITALS — BP 96/60 | HR 76 | Resp 16 | Ht 64.5 in | Wt 128.0 lb

## 2016-01-03 DIAGNOSIS — C50911 Malignant neoplasm of unspecified site of right female breast: Secondary | ICD-10-CM

## 2016-01-03 DIAGNOSIS — Z0000 Encounter for general adult medical examination without abnormal findings: Secondary | ICD-10-CM | POA: Diagnosis not present

## 2016-01-03 DIAGNOSIS — Z124 Encounter for screening for malignant neoplasm of cervix: Secondary | ICD-10-CM | POA: Diagnosis not present

## 2016-01-03 DIAGNOSIS — Z01419 Encounter for gynecological examination (general) (routine) without abnormal findings: Secondary | ICD-10-CM

## 2016-01-03 DIAGNOSIS — C50912 Malignant neoplasm of unspecified site of left female breast: Secondary | ICD-10-CM | POA: Diagnosis not present

## 2016-01-03 DIAGNOSIS — N898 Other specified noninflammatory disorders of vagina: Secondary | ICD-10-CM

## 2016-01-03 DIAGNOSIS — Z205 Contact with and (suspected) exposure to viral hepatitis: Secondary | ICD-10-CM

## 2016-01-03 LAB — POCT URINALYSIS DIPSTICK
Bilirubin, UA: NEGATIVE
Blood, UA: NEGATIVE
Glucose, UA: NEGATIVE
Ketones, UA: NEGATIVE
Leukocytes, UA: NEGATIVE
Nitrite, UA: NEGATIVE
Protein, UA: NEGATIVE
Urobilinogen, UA: NEGATIVE
pH, UA: 5

## 2016-01-03 NOTE — Progress Notes (Signed)
55 y.o. [id number] MarriedCaucasianF here for annual exam.  Doing well.  Denis vaginal bleeding.  Reports her husband reports a little bit of odor that she would like me to evaluate today.   Seeing new psychiatrist this year.  Was seeing Arbie Cookey who is now the office manager where her new psychiatrist is located.    No LMP recorded. Patient is not currently having periods (Reason: Perimenopausal).          Sexually active: Yes.    The current method of family planning is none.    Exercising: Yes.    yoga, cardio Smoker:  no  Health Maintenance: Pap:  10/17/2014 negative  History of abnormal Pap:  yes MMG:  S/p mastectomy  Colonoscopy:  1/13.  Dr. Collene Mares.  Follow up 10 years.   BMD:   08/11/2012 osteopenia, -1.2 TDaP: 10/10/2013  Pneumonia vaccine(s):  never Zostavax:   never Hep C testing: drawn today   Screening Labs: drawn today, Hb today: drawn today, Urine today: normal    reports that she has never smoked. She has never used smokeless tobacco. She reports that she drinks alcohol. She reports that she does not use illicit drugs.  Past Medical History  Diagnosis Date  . Anxiety   . Depression   . Breast cancer 05/05/12    right, ER +, PR -, Her 2 -  . Arthritis   . History of radiation therapy 07/14/2012-09/03/12    right chest wall 59.4 gray  . H/O dizziness   . Abnormal Pap smear of cervix     with conization  . Sleep apnea     cpap-has not been using-doing well without it    Past Surgical History  Procedure Laterality Date  . Cesarean section      x3  . Joint replacement      Rt and Lt thumbs  . Cervical conization w/bx    . Breast surgery      several biopsies, needle aspiration  . Mastectomy w/ sentinel node biopsy  06/07/2012    Procedure: MASTECTOMY WITH SENTINEL LYMPH NODE BIOPSY;  Surgeon: Haywood Lasso, MD;  Location: [facility];  Service: General;  Laterality: Right;  right total mastectomy and sentinel node  . Mastectomy modified radical  06/07/2012   Procedure: MASTECTOMY MODIFIED RADICAL;  Surgeon: Haywood Lasso, MD;  Location: [facility];  Service: General;  Laterality: Right;  . Tubal ligation    . Simple mastectomy with axillary sentinel node biopsy Left 09/30/2012    Procedure: SIMPLE MASTECTOMY;  Surgeon: Haywood Lasso, MD;  Location: [facility];  Service: General;  Laterality: Left;  left total mastectomy  . Latissimus flap to breast Right 06/06/2013    Procedure: RIGHT LATISSIMUS MYOCUTANEOUS FLAP WITH PLACEMENT OF TISSUE EXPANDER IN RIGHT BREAST ;  Surgeon: Theodoro Kos, DO;  Location: [facility];  Service: Plastics;  Laterality: Right;  . Breast reconstruction with placement of tissue expander and flex hd (acellular hydrated dermis) Left 06/06/2013    Procedure: PLACEMENT OF LEFT BREAST TISSUE EXPANDER AND FLEX HD ;  Surgeon: Theodoro Kos, DO;  Location: [facility];  Service: Plastics;  Laterality: Left;  . Removal of bilateral tissue expanders with placement of bilateral breast implants Bilateral 11/10/2013    Procedure: REMOVAL OF BILATERAL TISSUE EXPANDERS WITH PLACEMENT OF SILICONE BREAST IMPLANTS WITH BILATERAL CAPSULOTOMIES;  Surgeon: Theodoro Kos, DO;  Location: [facility];  Service: Plastics;  Laterality: Bilateral;  . Cosmetic surgery  04/19/14  Dr. Migdalia Dk, mini tummy tuck, smoothed out right breast, left breast lift  . Dilatation & currettage/hysteroscopy with resectocope N/A 06/12/2014    Procedure: DILATATION & CURETTAGE/HYSTEROSCOPY ;  Surgeon: Lyman Speller, MD;  Location: [facility];  Service: Gynecology;  Laterality: N/A;    Current Outpatient Prescriptions  Medication Sig Dispense Refill  . ALPRAZolam (XANAX) 0.5 MG tablet Take 0.5 mg by mouth daily as needed for anxiety.     Marland Kitchen amphetamine-dextroamphetamine (ADDERALL XR) 20 MG 24 hr capsule   0  . Biotin 10 MG TABS Take 1 tablet by mouth daily.    Marland Kitchen buPROPion (WELLBUTRIN XL) 150 MG 24 hr tablet Take 300 mg by mouth daily.     Marland Kitchen  L-Methylfolate-Algae (DEPLIN 15) 15-90.314 MG CAPS     . Multiple Vitamins-Minerals (MULTIVITAMIN WITH MINERALS) tablet Take 1 tablet by mouth daily.    . NON FORMULARY [employer]-longevity womens formula    . NON FORMULARY Pure [employer]-Adren 2 capsules daily    . traZODone (DESYREL) 50 MG tablet Take 150 mg by mouth at bedtime.    Marland Kitchen VIIBRYD 40 MG TABS Take 40 mg by mouth daily.      No current facility-administered medications for this visit.    Family History  Problem Relation Age of Onset  . Breast cancer Mother 26  . Lymphoma Mother 42    non-hodgkins lymphoma in her epiglottis  . Cancer Mother     non-hodkins lymphoma, breast  . Hypertension Mother   . Heart disease Mother   . Deep vein thrombosis Mother   . Thyroid disease Mother   . Osteoporosis Mother   . Stroke Maternal Grandfather   . Bipolar disorder Brother   . Gout Brother   . Breast cancer Other     MGM's sister; dx <50  . Breast cancer Other 46    MGF's sister  . Brain cancer Other     MGM's sister; dx <50  . Leukemia Other     mother's maternal cousin; dx under 10    ROS:  Pertinent items are noted in HPI.  Otherwise, a comprehensive ROS was negative.  Exam:   BP 96/60 mmHg  Pulse 76  Resp 16  Ht 5' 4.5" (1.638 m)  Wt 128 lb (58.06 kg)  BMI 21.64 kg/m2  Weight change: +9#  Height: 5' 4.5" (163.8 cm)  Ht Readings from Last 3 Encounters:  01/03/16 5' 4.5" (1.638 m)  03/12/15 5' 4.5" (1.638 m)  10/17/14 5' 4.5" (1.638 m)   General appearance: alert, cooperative and appears stated age Head: Normocephalic, without obvious abnormality, atraumatic Neck: no adenopathy, supple, symmetrical, trachea midline and thyroid normal to inspection and palpation Lungs: clear to auscultation bilaterally Breasts: bilateral implants, no masses, no LAD Heart: regular rate and rhythm Abdomen: soft, non-tender; bowel sounds normal; no masses,  no organomegaly Extremities: extremities normal, atraumatic, no  cyanosis or edema Skin: Skin color, texture, turgor normal. No rashes or lesions Lymph nodes: Cervical, supraclavicular, and axillary nodes normal. No abnormal inguinal nodes palpated Neurologic: Grossly normal  Pelvic: External genitalia:  no lesions              Urethra:  normal appearing urethra with no masses, tenderness or lesions              Bartholins and Skenes: normal                 Vagina: normal appearing vagina with normal color and discharge, no  lesions               Cervix: no lesions              Pap taken: Yes.   Bimanual Exam:  Uterus:  normal size, contour, position, consistency, mobility, non-tender              Adnexa: normal adnexa and no mass, fullness, tenderness               Rectovaginal: Confirms               Anus:  normal sphincter tone, no lesions  Chaperone was present for exam.  A:  Well Woman with normal exam PMP no HRT H/O ER+ breast cancer s/p Right mastectomy with 3+ LN/chemo/radiation 11/13 and s/p left simple mastectomy 3/14.  Genetic testing was negative. Mother with hx of breast cancer.  She is now deceased. OSA  Anxiety/Depression Vaginal discharge/odor H/O abnormal pap s/p conization 1998  P: Mammograms not needed pap smear today. Pap with neg HR HPV testing 3/15 CMP, Lipids, Vit D, TSH, CBC today Affirm pending Pt requests ca 27.29 be done today Hep C antibody testing obtained today as well return annually or prn

## 2016-01-04 LAB — WET PREP BY MOLECULAR PROBE
Candida species: NEGATIVE
Gardnerella vaginalis: POSITIVE — AB
Trichomonas vaginosis: NEGATIVE

## 2016-01-04 LAB — COMPREHENSIVE METABOLIC PANEL
ALT: 25 U/L (ref 6–29)
AST: 25 U/L (ref 10–35)
Albumin: 4.1 g/dL (ref 3.6–5.1)
Alkaline Phosphatase: 96 U/L (ref 33–130)
BUN: 14 mg/dL (ref 7–25)
CO2: 27 mmol/L (ref 20–31)
Calcium: 9.2 mg/dL (ref 8.6–10.4)
Chloride: 100 mmol/L (ref 98–110)
Creat: 0.89 mg/dL (ref 0.50–1.05)
Glucose, Bld: 97 mg/dL (ref 65–99)
Potassium: 4 mmol/L (ref 3.5–5.3)
Sodium: 139 mmol/L (ref 135–146)
Total Bilirubin: 0.7 mg/dL (ref 0.2–1.2)
Total Protein: 6.9 g/dL (ref 6.1–8.1)

## 2016-01-04 LAB — CBC
HCT: 40.1 % (ref 35.0–45.0)
Hemoglobin: 13.4 g/dL (ref 11.7–15.5)
MCH: 30.9 pg (ref 27.0–33.0)
MCHC: 33.4 g/dL (ref 32.0–36.0)
MCV: 92.4 fL (ref 80.0–100.0)
MPV: 9.3 fL (ref 7.5–12.5)
Platelets: 279 10*3/uL (ref 140–400)
RBC: 4.34 MIL/uL (ref 3.80–5.10)
RDW: 13.2 % (ref 11.0–15.0)
WBC: 4.7 10*3/uL (ref 3.8–10.8)

## 2016-01-04 LAB — LIPID PANEL
Cholesterol: 203 mg/dL — ABNORMAL HIGH (ref 125–200)
HDL: 73 mg/dL (ref >=46)
LDL Cholesterol: 102 mg/dL (ref <130)
Total CHOL/HDL Ratio: 2.8 Ratio (ref <=5.0)
Triglycerides: 141 mg/dL (ref <150)
VLDL: 28 mg/dL (ref <30)

## 2016-01-04 LAB — TSH: TSH: 1.74 mIU/L

## 2016-01-04 LAB — CANCER ANTIGEN 27.29: CA 27.29: 24 U/mL (ref <38)

## 2016-01-04 LAB — HEPATITIS C ANTIBODY: HCV Ab: NEGATIVE

## 2016-01-04 LAB — IPS PAP TEST WITH REFLEX TO HPV

## 2016-01-04 LAB — VITAMIN D 25 HYDROXY (VIT D DEFICIENCY, FRACTURES): Vit D, 25-Hydroxy: 68 ng/mL (ref 30–100)

## 2016-01-07 MED ORDER — METRONIDAZOLE 0.75 % VA GEL: 1.0000 | Freq: Every day | VAGINAL | Status: DC

## 2016-01-07 NOTE — Addendum Note (Signed)
Addended by: Megan Salon on: 01/07/2016 05:47 PM   Modules accepted: Orders, SmartSet

## 2016-01-12 ENCOUNTER — Other Ambulatory Visit: Payer: Self-pay | Admitting: Obstetrics & Gynecology

## 2016-01-14 ENCOUNTER — Telehealth: Payer: Self-pay | Admitting: Obstetrics & Gynecology

## 2016-01-14 ENCOUNTER — Emergency Department (HOSPITAL_BASED_OUTPATIENT_CLINIC_OR_DEPARTMENT_OTHER): Payer: 59

## 2016-01-14 ENCOUNTER — Encounter (HOSPITAL_BASED_OUTPATIENT_CLINIC_OR_DEPARTMENT_OTHER): Payer: Self-pay | Admitting: *Deleted

## 2016-01-14 ENCOUNTER — Emergency Department (HOSPITAL_BASED_OUTPATIENT_CLINIC_OR_DEPARTMENT_OTHER)
Admission: EM | Admit: 2016-01-14 | Discharge: 2016-01-14 | Disposition: A | Discharge status: Home / Self Care | Payer: 59 | Attending: Emergency Medicine | Admitting: Emergency Medicine

## 2016-01-14 DIAGNOSIS — M1990 Unspecified osteoarthritis, unspecified site: Secondary | ICD-10-CM | POA: Diagnosis not present

## 2016-01-14 DIAGNOSIS — F329 Major depressive disorder, single episode, unspecified: Secondary | ICD-10-CM | POA: Insufficient documentation

## 2016-01-14 DIAGNOSIS — Z853 Personal history of malignant neoplasm of breast: Secondary | ICD-10-CM | POA: Diagnosis not present

## 2016-01-14 DIAGNOSIS — M549 Dorsalgia, unspecified: Secondary | ICD-10-CM | POA: Diagnosis present

## 2016-01-14 DIAGNOSIS — M5431 Sciatica, right side: Secondary | ICD-10-CM | POA: Insufficient documentation

## 2016-01-14 LAB — CBC WITH DIFFERENTIAL/PLATELET
Basophils Absolute: 0 10*3/uL (ref 0.0–0.1)
Basophils Relative: 0 %
Eosinophils Absolute: 0.1 10*3/uL (ref 0.0–0.7)
Eosinophils Relative: 1 %
HCT: 34.5 % — ABNORMAL LOW (ref 36.0–46.0)
Hemoglobin: 11.4 g/dL — ABNORMAL LOW (ref 12.0–15.0)
Lymphocytes Relative: 14 %
Lymphs Abs: 0.9 10*3/uL (ref 0.7–4.0)
MCH: 31.1 pg (ref 26.0–34.0)
MCHC: 33 g/dL (ref 30.0–36.0)
MCV: 94 fL (ref 78.0–100.0)
Monocytes Absolute: 0.6 10*3/uL (ref 0.1–1.0)
Monocytes Relative: 9 %
Neutro Abs: 4.7 10*3/uL (ref 1.7–7.7)
Neutrophils Relative %: 76 %
Platelets: 228 10*3/uL (ref 150–400)
RBC: 3.67 MIL/uL — ABNORMAL LOW (ref 3.87–5.11)
RDW: 12.9 % (ref 11.5–15.5)
WBC: 6.2 10*3/uL (ref 4.0–10.5)

## 2016-01-14 LAB — URINALYSIS, ROUTINE W REFLEX MICROSCOPIC
Bilirubin Urine: NEGATIVE
Glucose, UA: NEGATIVE mg/dL
Hgb urine dipstick: NEGATIVE
Ketones, ur: NEGATIVE mg/dL
Leukocytes, UA: NEGATIVE
Nitrite: NEGATIVE
Protein, ur: NEGATIVE mg/dL
Specific Gravity, Urine: 1.007 (ref 1.005–1.030)
pH: 7 (ref 5.0–8.0)

## 2016-01-14 LAB — BASIC METABOLIC PANEL
Anion gap: 7 (ref 5–15)
BUN: 10 mg/dL (ref 6–20)
CO2: 31 mmol/L (ref 22–32)
Calcium: 9.1 mg/dL (ref 8.9–10.3)
Chloride: 103 mmol/L (ref 101–111)
Creatinine, Ser: 0.7 mg/dL (ref 0.44–1.00)
GFR calc Af Amer: >60 mL/min (ref >60)
GFR calc non Af Amer: >60 mL/min (ref >60)
Glucose, Bld: 80 mg/dL (ref 65–99)
Potassium: 3.1 mmol/L — ABNORMAL LOW (ref 3.5–5.1)
Sodium: 141 mmol/L (ref 135–145)

## 2016-01-14 MED ORDER — DEXAMETHASONE SODIUM PHOSPHATE 10 MG/ML IJ SOLN
10.0000 mg | Freq: Once | INTRAMUSCULAR | Status: AC
  Administered 2016-01-14: 10 mg via INTRAVENOUS
  Filled 2016-01-14: qty 1

## 2016-01-14 MED ORDER — HYDROCODONE-ACETAMINOPHEN 5-325 MG PO TABS: 1.0000 | ORAL_TABLET | Freq: Four times a day (QID) | ORAL | Status: DC | PRN

## 2016-01-14 MED ORDER — PREDNISONE 50 MG PO TABS: 50.0000 mg | ORAL_TABLET | Freq: Every day | ORAL | Status: DC

## 2016-01-14 MED ORDER — MORPHINE SULFATE (PF) 4 MG/ML IV SOLN
4.0000 mg | Freq: Once | INTRAVENOUS | Status: AC
  Administered 2016-01-14: 4 mg via INTRAVENOUS
  Filled 2016-01-14: qty 1

## 2016-01-14 MED ORDER — CYCLOBENZAPRINE HCL 10 MG PO TABS: 10.0000 mg | ORAL_TABLET | Freq: Every day | ORAL | Status: DC

## 2016-01-14 MED ORDER — KETOROLAC TROMETHAMINE 30 MG/ML IJ SOLN
30.0000 mg | Freq: Once | INTRAMUSCULAR | Status: AC
  Administered 2016-01-14: 30 mg via INTRAVENOUS
  Filled 2016-01-14: qty 1

## 2016-01-14 NOTE — ED Notes (Signed)
Pt placed on auto vitals Q30.  

## 2016-01-14 NOTE — Telephone Encounter (Signed)
Patient left a message on the answering machine during lunch stating that she was having some right side pain.

## 2016-01-14 NOTE — ED Notes (Addendum)
IV removed.

## 2016-01-14 NOTE — ED Provider Notes (Signed)
CSN: [id number]     Arrival date & time 01/14/16  1747 History   First MD Initiated Contact with Patient 01/14/16 1820     Chief Complaint  Patient presents with  . Back Pain     (Consider location/radiation/quality/duration/timing/severity/associated sxs/prior Treatment) HPI Patient presents to the emergency department with right-sided back pain that radiates into her right extremity.  The patient states that she has had an episode of sciatica previously, but is been a long time since she had this.  Patient states that movement and palpation make the pain worse.  The patient states that she did go to a yoga class, which seemed to make the pain in her back, worse.  Patient states she is very active and normally does not have back problems.  The patient states that movement and palpation make the pain worse.  Patient states she did not take any medications prior to arrival. The patient denies chest pain, shortness of breath, headache,blurred vision, neck pain, fever, cough, weakness, numbness, dizziness, anorexia, edema, abdominal pain, nausea, vomiting, diarrhea, rash,, dysuria, hematemesis, bloody stool, near syncope, or syncope. Past Medical History  Diagnosis Date  . Anxiety   . Depression   . Breast cancer ([facility]) 05/05/12    right, ER +, PR -, Her 2 -  . Arthritis   . History of radiation therapy 07/14/2012-09/03/12    right chest wall 59.4 gray  . H/O dizziness   . Abnormal Pap smear of cervix     with conization  . Sleep apnea     cpap-has not been using-doing well without it   Past Surgical History  Procedure Laterality Date  . Cesarean section      x3  . Joint replacement      Rt and Lt thumbs  . Cervical conization w/bx    . Breast surgery      several biopsies, needle aspiration  . Mastectomy w/ sentinel node biopsy  06/07/2012    Procedure: MASTECTOMY WITH SENTINEL LYMPH NODE BIOPSY;  Surgeon: Haywood Lasso, MD;  Location: [facility];  Service: General;  Laterality: Right;   right total mastectomy and sentinel node  . Mastectomy modified radical  06/07/2012    Procedure: MASTECTOMY MODIFIED RADICAL;  Surgeon: Haywood Lasso, MD;  Location: [facility];  Service: General;  Laterality: Right;  . Tubal ligation    . Simple mastectomy with axillary sentinel node biopsy Left 09/30/2012    Procedure: SIMPLE MASTECTOMY;  Surgeon: Haywood Lasso, MD;  Location: [facility];  Service: General;  Laterality: Left;  left total mastectomy  . Latissimus flap to breast Right 06/06/2013    Procedure: RIGHT LATISSIMUS MYOCUTANEOUS FLAP WITH PLACEMENT OF TISSUE EXPANDER IN RIGHT BREAST ;  Surgeon: Theodoro Kos, DO;  Location: [facility];  Service: Plastics;  Laterality: Right;  . Breast reconstruction with placement of tissue expander and flex hd (acellular hydrated dermis) Left 06/06/2013    Procedure: PLACEMENT OF LEFT BREAST TISSUE EXPANDER AND FLEX HD ;  Surgeon: Theodoro Kos, DO;  Location: [facility];  Service: Plastics;  Laterality: Left;  . Removal of bilateral tissue expanders with placement of bilateral breast implants Bilateral 11/10/2013    Procedure: REMOVAL OF BILATERAL TISSUE EXPANDERS WITH PLACEMENT OF SILICONE BREAST IMPLANTS WITH BILATERAL CAPSULOTOMIES;  Surgeon: Theodoro Kos, DO;  Location: [facility];  Service: Plastics;  Laterality: Bilateral;  . Cosmetic surgery  04/19/14    Dr. Migdalia Dk, mini tummy tuck, smoothed out right breast, left breast lift  .  Dilatation & currettage/hysteroscopy with resectocope N/A 06/12/2014    Procedure: DILATATION & CURETTAGE/HYSTEROSCOPY ;  Surgeon: Lyman Speller, MD;  Location: [facility];  Service: Gynecology;  Laterality: N/A;   Family History  Problem Relation Age of Onset  . Breast cancer Mother 74  . Lymphoma Mother 81    non-hodgkins lymphoma in her epiglottis  . Cancer Mother     non-hodkins lymphoma, breast  . Hypertension Mother   . Heart disease Mother   . Deep vein thrombosis Mother   .  Thyroid disease Mother   . Osteoporosis Mother   . Stroke Maternal Grandfather   . Bipolar disorder Brother   . Gout Brother   . Breast cancer Other     MGM's sister; dx <50  . Breast cancer Other 66    MGF's sister  . Brain cancer Other     MGM's sister; dx <50  . Leukemia Other     mother's maternal cousin; dx under 10   Social History  Substance Use Topics  . Smoking status: Never Smoker   . Smokeless tobacco: Never Used  . Alcohol Use: 0.0 oz/week    0 Standard drinks or equivalent per week     Comment: socially   OB History    Gravida Para Term Preterm AB TAB SAB Ectopic Multiple Living   6 3 2 1 3  3   3      Review of Systems  All other systems negative except as documented in the HPI. All pertinent positives and negatives as reviewed in the HPI.   Allergies  Doxycycline  Home Medications   Prior to Admission medications   Medication Sig Start Date End Date Taking? Authorizing Provider  ALPRAZolam Duanne Moron) 0.5 MG tablet Take 0.5 mg by mouth daily as needed for anxiety.  02/21/14   Historical Provider, MD  amphetamine-dextroamphetamine (ADDERALL XR) 20 MG 24 hr capsule 30 mg.  10/05/14   Historical Provider, MD  buPROPion (WELLBUTRIN XL) 150 MG 24 hr tablet Take 300 mg by mouth daily.  05/03/12   Historical Provider, MD  calcium-vitamin D ([employer] OY[employer] SHELL CALCIUM-VIT D) 500-200 MG-UNIT tablet Take by mouth.    Historical Provider, MD  L-Methylfolate-Algae (DEPLIN 15) 15-90.314 MG CAPS  06/02/14   Historical Provider, MD  metroNIDAZOLE (METROGEL) 0.75 % vaginal gel Place 1 Applicatorful vaginally at bedtime. 01/07/16   Megan Salon, MD  NON FORMULARY Juice Plus: Greens, reds, purples    Historical Provider, MD  NON FORMULARY Juice plus omega 3    Historical Provider, MD  VIIBRYD 40 MG TABS Take 40 mg by mouth daily.  05/03/12   Historical Provider, MD   BP 99/58 mmHg  Pulse 77  Temp(Src) 98.7 F (37.1 C) (Oral)  Resp 18  Ht 5\' 4"  (1.626 m)  Wt 56.7 kg  BMI 21.45  kg/m2  SpO2 96% Physical Exam  Constitutional: She is oriented to person, place, and time. She appears well-developed and well-nourished. No distress.  HENT:  Head: Normocephalic and atraumatic.  Mouth/Throat: Oropharynx is clear and moist.  Eyes: Pupils are equal, round, and reactive to light.  Neck: Normal range of motion. Neck supple.  Cardiovascular: Normal rate, regular rhythm and normal heart sounds.  Exam reveals no gallop and no friction rub.   No murmur heard. Pulmonary/Chest: Effort normal and breath sounds normal. No respiratory distress. She has no wheezes.  Abdominal: Soft. Bowel sounds are normal. She exhibits no distension. There is no tenderness.  Musculoskeletal:  Back:  Neurological: She is alert and oriented to person, place, and time. She has normal reflexes. She exhibits normal muscle tone. Coordination normal.  Skin: Skin is warm and dry. No rash noted. No erythema.  Psychiatric: She has a normal mood and affect. Her behavior is normal.  Nursing note and vitals reviewed.   ED Course  Procedures (including critical care time) Labs Review Labs Reviewed  BASIC METABOLIC PANEL - Abnormal; Notable for the following:    Potassium 3.1 (*)    All other components within normal limits  CBC WITH DIFFERENTIAL/PLATELET - Abnormal; Notable for the following:    RBC 3.67 (*)    Hemoglobin 11.4 (*)    HCT 34.5 (*)    All other components within normal limits  URINALYSIS, ROUTINE W REFLEX MICROSCOPIC (NOT AT [facility])    Imaging Review Dg Lumbar Spine Complete  01/14/2016  CLINICAL DATA:  Acute onset of lower back pain radiating down the right leg. Initial encounter. EXAM: LUMBAR SPINE - COMPLETE 4+ VIEW COMPARISON:  CT of the abdomen and pelvis from 10/26/2014 FINDINGS: There is no evidence of fracture or subluxation. Vertebral bodies demonstrate normal height and alignment. Intervertebral disc spaces are preserved. The visualized neural foramina are grossly  unremarkable in appearance. The visualized bowel gas pattern is unremarkable in appearance; air and stool are noted within the colon. The sacroiliac joints are within normal limits. IMPRESSION: No evidence of fracture or subluxation along the lumbar spine. Electronically Signed   By: Garald Balding M.D.   On: 01/14/2016 19:54   I have personally reviewed and evaluated these images and lab results as part of my medical decision-making.    Base and the patient's physical exam findings and testing.  She most likely has sciatica.  Patient does have normal reflexes and no motor deficits on exam.  She has normal strength and normal gait.  Patient is feeling better following medications given here in the emergency department.  Patient is advised follow-up with her primary care Dr. told to return here as needed.  Patient agrees the plan.  All questions were answered  Dalia Heading, PA-C 01/16/16 [id number]  Leo Grosser, MD 01/16/16 1550

## 2016-01-14 NOTE — Telephone Encounter (Signed)
Spoke with patient. Patient states that when she started her rx for Metrogel she began to have right sided numbness on her buttocks that transferred down her right leg then to the upper part of her back and then to the left side of her neck. Reports she is having intermittent headaches without visual changes. Denies current headache. Left arm is tingling and her hand goes numb intermittently. Denies any chest pain or radiating pain down her left arm. Reports she is having "slight difficulty" breathing. Patient is asking if BV can spread and cause the symptoms she is experiencing. Patient is very concerned. Advised the symptoms she is experiencing are not coming from recent BV or treatment with Metrogel. Advised she will need to see her PCP. Patient states she feels her BV has been present for a long time and is causing her symptoms. Requests I speak with Dr.Miller about next steps.  Spoke with Dr.Miller who recommends that patient be seen with her PCP for evaluation.  Return call to patient advised of recommendations from [facility] for her to see her PCP. Patient states she is walking in to see her PCP now for evaluation.  Routing to provider for review prior to closing.

## 2016-01-14 NOTE — Discharge Instructions (Signed)
Return here as needed.  Follow-up with your primary care doctor.  Use ice and heat alternating on you'r lower back

## 2016-01-14 NOTE — ED Notes (Signed)
Pt made aware to return if symptoms worsen or if any life threatening symptoms occur.   

## 2016-01-14 NOTE — ED Notes (Signed)
Back pain with radiation into her right leg.

## 2016-01-14 NOTE — Telephone Encounter (Signed)
Patient states shes had pain and numbness on right side for 3 days. States it is from the neck down. Denies headache and difficulty smiling. No slurred speech on the call. Patient would like to come in to office to see provider. Notified patient all lines are busy currently and we will send message to nurse to return her call. Patient stated this was an emergency. I reassured her if she felt her situation was emergent she is welcome to seek care at her local emergency department, but we will return her call promptly. Patient agreeable.   Message to triage.

## 2016-01-17 NOTE — Telephone Encounter (Signed)
ER note reviewed.  Diagnosis is sciatica.  Ok to close encounter.

## 2016-01-22 ENCOUNTER — Other Ambulatory Visit: Payer: Self-pay | Admitting: Family Medicine

## 2016-01-22 DIAGNOSIS — M545 Low back pain: Secondary | ICD-10-CM

## 2016-01-23 ENCOUNTER — Other Ambulatory Visit: Payer: Self-pay | Admitting: Family Medicine

## 2016-01-23 ENCOUNTER — Other Ambulatory Visit: Payer: Self-pay

## 2016-01-23 DIAGNOSIS — R748 Abnormal levels of other serum enzymes: Secondary | ICD-10-CM

## 2016-01-24 ENCOUNTER — Ambulatory Visit
Admission: RE | Admit: 2016-01-24 | Discharge: 2016-01-24 | Disposition: A | Discharge status: Home / Self Care | Payer: 59 | Source: Ambulatory Visit | Attending: Family Medicine | Admitting: Family Medicine

## 2016-01-24 DIAGNOSIS — M545 Low back pain: Secondary | ICD-10-CM

## 2016-01-24 DIAGNOSIS — R748 Abnormal levels of other serum enzymes: Secondary | ICD-10-CM

## 2016-01-24 MED ORDER — GADOBENATE DIMEGLUMINE 529 MG/ML IV SOLN
11.0000 mL | Freq: Once | INTRAVENOUS | Status: AC | PRN
  Administered 2016-01-24: 11 mL via INTRAVENOUS

## 2016-03-07 ENCOUNTER — Other Ambulatory Visit: Payer: Self-pay | Admitting: *Deleted

## 2016-03-07 DIAGNOSIS — C50911 Malignant neoplasm of unspecified site of right female breast: Secondary | ICD-10-CM

## 2016-03-09 NOTE — Progress Notes (Signed)
. Kelly [name]   DOB: 05/24/1961  MR#: [id number]  ZRA#:[id number]  PCP:  Kelly [name] GYN: Lyman Speller, SU: Neldon Mc OTHER MD: Gery Pray, Gypsy Balsam, Claire Sanger, Rupinder Toy Care, Arbie Cookey  CHIEF COMPLAINT: follow up of right breast cancer  CURRENT TREATMENT: Observation  BREAST CANCER HISTORY:  From the prior summary:  The patient herself noted a change in her right breast. She brought it to her primary care physician, Dr. Carron Brazen attention, and diagnostic mammography was obtained at [facility] 05/05/2012. The breasts were found to be heterogeneously dense. There was a spiculated mass posteriorly and laterally to the nipple line in the right breast, and a second mass more medially. There was an asymmetric soft tissue density in the upper outer quadrant of the left breast, without any discrete mass identified. There was a cluster of microcalcifications medially in the left breast. Ultrasound of the right breast showed a lobulated mass measuring 2.3 cm and a second mass measuring 4.5 cm. There was also a third mass measuring 1.9 cm which was felt possibly to represent a lymph node. In the right axilla there was a suspicious lymph node measuring 1.1 cm.  Biopsy of this 3 breast masses was obtained the same day, and showed all 3 to represent invasive ductal carcinoma, grade 1. Because the 3 lesions were morphologically similar, only one prognostic panel was obtained (from the mass at "7:00"), showing it to be estrogen receptor 100% positive, progesterone receptor negative, with an MIB-1 of 12%, and no HER-2 amplification ([id number]).  Bilateral breast MRI was obtained 05/10/2012, and confirmed the 3 right breast masses in question, measuring 2.1, 2.8 (at the 7:00 position) and 2.2 cm. In addition a 1.3 cm right axillary lymph node was noted. There were no suspicious findings in the left breast. On October 30 fine-needle aspiration of the suspicious right  axilla lymph node was performed, with the results being negative ([id number]).   Finally on 06/07/2012 the patient underwent right modified radical mastectomy. The three masses in the right breast measured 2.2 cm, 3 cm, and 2.3 cm ([id number]). The 2.3 cm mass was morphologically slightly different (grade 2) and a prognostic profile was obtained from this mass ("superior mid-medial"). It was 100% estrogen and 98% progesterone receptor positive. MIB-1 was 16%. There was no HER-2 amplification. A total of 19 lymph nodes were sampled, of which 9 were positive. Margins were negative, but close (1 mm). The patient's subsequent history is as detailed below.  INTERVAL HISTORY: Kelly [name] returns today for follow-up of her estrogen receptor positive breast cancer. She never accepted adjuvant systemic treatment and is followed with observation alone.  She has a history of chronic back pain and on 01/24/2016 underwent lumbar spine MRI which showed progressive degenerative disease but no evidence of metastatic disease. She had an MRI of the abdomen with and without contrast the same day and it showed no liver abnormality, and normal biliary tree, and normal pancreas, and no adenopathy. Note that she also had a CT of the abdomen and pelvis March 2016, and a bone scan 10/19/2013, all negative for evidence of metastatic disease.  REVIEW OF SYSTEMS: She is doing "wonderful". She continues to have significant pain problems related to her scoliosis and degenerative disc disease. She is treating this chiefly with yoga. She did recently get prescribed some steroids but instead is juicing. She tells me she had some elevated liver function test recently which however have resolved. She thinks this may have  been related to some of her medications. These are updated separately. Otherwise she bruises easily and has problems with hot flashes. A detailed review of systems today was otherwise stable.    PAST MEDICAL HISTORY: Past  Medical History:  Diagnosis Date  . Abnormal Pap smear of cervix    with conization  . Anxiety   . Arthritis   . Breast cancer ([place]) 05/05/12   right, ER +, PR -, Her 2 -  . Depression   . H/O dizziness   . History of radiation therapy 07/14/2012-09/03/12   right chest wall 59.4 gray  . Sleep apnea    cpap-has not been using-doing well without it   sleep apnea; migraines;  PAST SURGICAL HISTORY: Past Surgical History:  Procedure Laterality Date  . BREAST RECONSTRUCTION WITH PLACEMENT OF TISSUE EXPANDER AND FLEX HD (ACELLULAR HYDRATED DERMIS) Left 06/06/2013   Procedure: PLACEMENT OF LEFT BREAST TISSUE EXPANDER AND FLEX HD ;  Surgeon: Theodoro Kos, DO;  Location: [place];  Service: Plastics;  Laterality: Left;  . BREAST SURGERY     several biopsies, needle aspiration  . CERVICAL CONIZATION W/BX    . CESAREAN SECTION     x3  . COSMETIC SURGERY  04/19/14   Dr. Migdalia Dk, mini tummy tuck, smoothed out right breast, left breast lift  . DILATATION & CURRETTAGE/HYSTEROSCOPY WITH RESECTOCOPE N/A 06/12/2014   Procedure: DILATATION & CURETTAGE/HYSTEROSCOPY ;  Surgeon: Lyman Speller, MD;  Location: [facility];  Service: Gynecology;  Laterality: N/A;  . JOINT REPLACEMENT     Rt and Lt thumbs  . LATISSIMUS FLAP TO BREAST Right 06/06/2013   Procedure: RIGHT LATISSIMUS MYOCUTANEOUS FLAP WITH PLACEMENT OF TISSUE EXPANDER IN RIGHT BREAST ;  Surgeon: Theodoro Kos, DO;  Location: [place];  Service: Plastics;  Laterality: Right;  . MASTECTOMY MODIFIED RADICAL  06/07/2012   Procedure: MASTECTOMY MODIFIED RADICAL;  Surgeon: Haywood Lasso, MD;  Location: [place];  Service: General;  Laterality: Right;  . MASTECTOMY W/ SENTINEL NODE BIOPSY  06/07/2012   Procedure: MASTECTOMY WITH SENTINEL LYMPH NODE BIOPSY;  Surgeon: Haywood Lasso, MD;  Location: [place];  Service: General;  Laterality: Right;  right total mastectomy and sentinel node  . REMOVAL OF BILATERAL TISSUE EXPANDERS WITH PLACEMENT OF BILATERAL  BREAST IMPLANTS Bilateral 11/10/2013   Procedure: REMOVAL OF BILATERAL TISSUE EXPANDERS WITH PLACEMENT OF SILICONE BREAST IMPLANTS WITH BILATERAL CAPSULOTOMIES;  Surgeon: Theodoro Kos, DO;  Location: [place];  Service: Plastics;  Laterality: Bilateral;  . SIMPLE MASTECTOMY WITH AXILLARY SENTINEL NODE BIOPSY Left 09/30/2012   Procedure: SIMPLE MASTECTOMY;  Surgeon: Haywood Lasso, MD;  Location: [place];  Service: General;  Laterality: Left;  left total mastectomy  . TUBAL LIGATION      FAMILY HISTORY Family History  Problem Relation Age of Onset  . Breast cancer Mother 8  . Lymphoma Mother 65    non-hodgkins lymphoma in her epiglottis  . Cancer Mother     non-hodkins lymphoma, breast  . Hypertension Mother   . Heart disease Mother   . Deep vein thrombosis Mother   . Thyroid disease Mother   . Osteoporosis Mother   . Stroke Maternal Grandfather   . Bipolar disorder Brother   . Gout Brother   . Breast cancer Other     MGM's sister; dx <50  . Breast cancer Other 68    MGF's sister  . Brain cancer Other     MGM's sister; dx <50  .  Leukemia Other     mother's maternal cousin; dx under 55  the patient's mother, Catlin Doria, is also my patient and has a history of breast cancer. The patient's father was adopted. Nikea has 2 brothers, no sisters. There is no history of ovarian cancer in the family.  GYNECOLOGIC HISTORY: She had menarche age 55, first live birth age 13. She is GX P3. LMP was in 2013, now being evaluated by gynecology and will undergo uterine biopsy due to vaginal spotting.  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 Her husband Elta Guadeloupe works for [employer] in [employer]. Their children are currently 14 years old,  34 (chemistry major at [employer] [place] [employer]), and 60.   ADVANCED DIRECTIVES: Not in place  HEALTH MAINTENANCE: Social History  Substance Use Topics  .  Smoking status: Never Smoker  . Smokeless tobacco: Never Used  . Alcohol use 0.0 oz/week     Comment: socially    Colonoscopy: 2012  PAP: unsure of date  Bone density: 08/11/2012 at [facility], osteopenia  Lipid panel: August 2013, Dr. Inis Sizer  Allergies  Allergen Reactions  . Doxycycline Rash    Current Outpatient Prescriptions  Medication Sig Dispense Refill  . ALPRAZolam (XANAX) 0.5 MG tablet Take 0.5 mg by mouth daily as needed for anxiety.     Marland Kitchen amphetamine-dextroamphetamine (ADDERALL XR) 20 MG 24 hr capsule 30 mg.   0  . buPROPion (WELLBUTRIN XL) 150 MG 24 hr tablet Take 300 mg by mouth daily.     Marland Kitchen L-Methylfolate-Algae (DEPLIN 15) 15-90.314 MG CAPS     . NON FORMULARY Juice Plus: Greens, reds, purples    . NON FORMULARY Juice plus omega 3    . VIIBRYD 40 MG TABS Take 40 mg by mouth daily.      No current facility-administered medications for this visit.     OBJECTIVE: Middle-aged white woman In no acute distress Vitals:   03/10/16 1012  BP: 105/65  Pulse: 74  Resp: 18  Temp: 98.8 F (37.1 C)     Body mass index is 20.46 kg/m.    ECOG FS: 1 Filed Weights   03/10/16 1012  Weight: 119 lb 3.2 oz (54.1 kg)   Sclerae unicteric, pupils round and equal Oropharynx clear and moist-- no thrush or other lesions No cervical or supraclavicular adenopathy Lungs no rales or rhonchi Heart regular rate and rhythm Abd soft, nontender, positive bowel sounds MSK significant scoliosis but no focal spinal tenderness, no upper extremity lymphedema Neuro: nonfocal, well oriented, appropriate affect Breasts: Status post bilateral mastectomies with bilateral implant reconstruction. There is no evidence of local recurrence. Both axillae are benign.   LAB RESULTS: Lab Results  Component Value Date   WBC 6.4 03/10/2016   NEUTROABS 4.9 03/10/2016   HGB 13.6 03/10/2016   HCT 40.9 03/10/2016   MCV 90.2 03/10/2016   PLT 230 03/10/2016      Chemistry      Component Value Date/Time    NA 141 01/14/2016 1915   NA 142 03/12/2015 0844   K 3.1 (L) 01/14/2016 1915   K 3.8 03/12/2015 0844   CL 103 01/14/2016 1915   CL 102 12/27/2012 0935   CO2 31 01/14/2016 1915   CO2 28 03/12/2015 0844   BUN 10 01/14/2016 1915   BUN 19.1 03/12/2015 0844   CREATININE 0.70 01/14/2016 1915   CREATININE 0.89 01/03/2016 1212   CREATININE 1.0 03/12/2015 0844      Component Value  Date/Time   CALCIUM 9.1 01/14/2016 1915   CALCIUM 9.3 03/12/2015 0844   ALKPHOS 96 01/03/2016 1212   ALKPHOS 90 03/12/2015 0844   AST 25 01/03/2016 1212   AST 29 03/12/2015 0844   ALT 25 01/03/2016 1212   ALT 30 03/12/2015 0844   BILITOT 0.7 01/03/2016 1212   BILITOT 0.55 03/12/2015 0844       Lab Results  Component Value Date   LABCA2 24 01/03/2016     STUDIES: No results found.  ASSESSMENT: 55 y.o. BRCA negative [place] woman status post right modified radical mastectomy 06/07/2012 for a multifocal invasive ductal carcinoma, the largest of 3 masses measuring 3.0 cm, grade 1; a second mass measuring 2.3 cm, grade 2; and a third mass measuring 2.2 cm, grade 1. The 2 larger masses were both 100% estrogen receptor positive, the grade 2 mass being also progesterone receptor positive at 98%, with an MIB-1 of 16% (the larger mass was progesterone receptor negative, with an Mib-1 of 12%). Both showed no HER-2 amplification. 9 of 19 axillary lymph nodes sampled were involved. In summary:  (1) in short, status post right modified radical mastectomy November 2013 for a mpT2 pN2a, stage IIIA invasive ductal carcinoma, grade 1-2, estrogen receptor positive, HER-2 not amplified, with an MIB-1 of 12-16%  (2) the patient opted to forego adjuvant chemotherapy  (3) adjuvant radiation therapy completed 09/03/2012   (4)  started on Tamoxifen, but she "never really took this" and declines further anti-estrogen therapy.    (5) left simple mastectomy 09/30/2012, with benign pathology; complicated by an infected seroma  MRSA infection.  (6) patient is s/p breast reconstruction.   (7) neck pain with CT neck negative for metastatic disease, also worsening back pain.  (8) history of major depressive disorder, status post transcranial magnetic simulation treatment completed December 2015    PLAN:  Blimie is now nearly 4 years out from definitive surgery for her breast cancer with no evidence of disease recurrence. This is very favorable.  We discussed her children situation and in particular her oldest son I think would be a very good candidate for nurse practitioner school. I suggested he check out with [facility], or here [place] a student.  If that is going to see me again next year. At that time we will consider "graduating" from routine cancer follow-up here.

## 2016-03-10 ENCOUNTER — Other Ambulatory Visit (HOSPITAL_BASED_OUTPATIENT_CLINIC_OR_DEPARTMENT_OTHER): Payer: 59

## 2016-03-10 ENCOUNTER — Other Ambulatory Visit: Payer: Self-pay | Admitting: *Deleted

## 2016-03-10 ENCOUNTER — Ambulatory Visit (HOSPITAL_BASED_OUTPATIENT_CLINIC_OR_DEPARTMENT_OTHER): Payer: 59 | Admitting: Oncology

## 2016-03-10 ENCOUNTER — Telehealth: Payer: Self-pay | Admitting: Oncology

## 2016-03-10 VITALS — BP 105/65 | HR 74 | Temp 98.8°F | Resp 18 | Ht 64.0 in | Wt 119.2 lb

## 2016-03-10 DIAGNOSIS — C50911 Malignant neoplasm of unspecified site of right female breast: Secondary | ICD-10-CM

## 2016-03-10 DIAGNOSIS — Z853 Personal history of malignant neoplasm of breast: Secondary | ICD-10-CM

## 2016-03-10 LAB — COMPREHENSIVE METABOLIC PANEL
ALT: 22 U/L (ref 0–55)
AST: 30 U/L (ref 5–34)
Albumin: 3.7 g/dL (ref 3.5–5.0)
Alkaline Phosphatase: 110 U/L (ref 40–150)
Anion Gap: 10 mEq/L (ref 3–11)
BUN: 18.6 mg/dL (ref 7.0–26.0)
CO2: 28 mEq/L (ref 22–29)
Calcium: 9.5 mg/dL (ref 8.4–10.4)
Chloride: 104 mEq/L (ref 98–109)
Creatinine: 1 mg/dL (ref 0.6–1.1)
EGFR: 68 mL/min/{1.73_m2} — ABNORMAL LOW (ref >90)
Glucose: 84 mg/dl (ref 70–140)
Potassium: 3.7 mEq/L (ref 3.5–5.1)
Sodium: 142 mEq/L (ref 136–145)
Total Bilirubin: 0.8 mg/dL (ref 0.20–1.20)
Total Protein: 7.1 g/dL (ref 6.4–8.3)

## 2016-03-10 LAB — CBC WITH DIFFERENTIAL/PLATELET
BASO%: 0.5 % (ref 0.0–2.0)
Basophils Absolute: 0 10*3/uL (ref 0.0–0.1)
EOS%: 2.3 % (ref 0.0–7.0)
Eosinophils Absolute: 0.1 10*3/uL (ref 0.0–0.5)
HCT: 40.9 % (ref 34.8–46.6)
HGB: 13.6 g/dL (ref 11.6–15.9)
LYMPH%: 9.7 % — ABNORMAL LOW (ref 14.0–49.7)
MCH: 29.9 pg (ref 25.1–34.0)
MCHC: 33.2 g/dL (ref 31.5–36.0)
MCV: 90.2 fL (ref 79.5–101.0)
MONO#: 0.6 10*3/uL (ref 0.1–0.9)
MONO%: 9.6 % (ref 0.0–14.0)
NEUT#: 4.9 10*3/uL (ref 1.5–6.5)
NEUT%: 77.9 % — ABNORMAL HIGH (ref 38.4–76.8)
Platelets: 230 10*3/uL (ref 145–400)
RBC: 4.54 10*6/uL (ref 3.70–5.45)
RDW: 13.9 % (ref 11.2–14.5)
WBC: 6.4 10*3/uL (ref 3.9–10.3)
lymph#: 0.6 10*3/uL — ABNORMAL LOW (ref 0.9–3.3)

## 2016-03-10 NOTE — Telephone Encounter (Signed)
appt made and avs printed °

## 2016-03-11 LAB — CANCER ANTIGEN 27-29 (PARALLEL TESTING): CA 27.29: 20 U/mL (ref <38)

## 2016-03-11 LAB — CANCER ANTIGEN 27.29: CA 27.29: 23.4 U/mL (ref 0.0–38.6)

## 2016-03-14 ENCOUNTER — Encounter: Payer: Self-pay | Admitting: Oncology

## 2016-09-17 DIAGNOSIS — M5136 Other intervertebral disc degeneration, lumbar region: Secondary | ICD-10-CM | POA: Diagnosis not present

## 2016-09-17 DIAGNOSIS — R293 Abnormal posture: Secondary | ICD-10-CM | POA: Diagnosis not present

## 2016-09-17 DIAGNOSIS — M2560 Stiffness of unspecified joint, not elsewhere classified: Secondary | ICD-10-CM | POA: Diagnosis not present

## 2016-09-24 DIAGNOSIS — R293 Abnormal posture: Secondary | ICD-10-CM | POA: Diagnosis not present

## 2016-09-24 DIAGNOSIS — M2560 Stiffness of unspecified joint, not elsewhere classified: Secondary | ICD-10-CM | POA: Diagnosis not present

## 2016-09-24 DIAGNOSIS — M5136 Other intervertebral disc degeneration, lumbar region: Secondary | ICD-10-CM | POA: Diagnosis not present

## 2016-11-19 DIAGNOSIS — S92355A Nondisplaced fracture of fifth metatarsal bone, left foot, initial encounter for closed fracture: Secondary | ICD-10-CM | POA: Diagnosis not present

## 2016-11-19 DIAGNOSIS — M79672 Pain in left foot: Secondary | ICD-10-CM | POA: Diagnosis not present

## 2016-12-10 ENCOUNTER — Encounter: Payer: Self-pay | Admitting: Nurse Practitioner

## 2016-12-10 ENCOUNTER — Ambulatory Visit (INDEPENDENT_AMBULATORY_CARE_PROVIDER_SITE_OTHER): Payer: 59 | Admitting: Nurse Practitioner

## 2016-12-10 VITALS — BP 90/60 | HR 76 | Temp 98.5°F | Resp 16 | Ht 64.0 in | Wt 123.0 lb

## 2016-12-10 DIAGNOSIS — R300 Dysuria: Secondary | ICD-10-CM | POA: Diagnosis not present

## 2016-12-10 DIAGNOSIS — N950 Postmenopausal bleeding: Secondary | ICD-10-CM

## 2016-12-10 LAB — POCT URINALYSIS DIPSTICK
Bilirubin, UA: NEGATIVE
Glucose, UA: NEGATIVE
Ketones, UA: NEGATIVE
Nitrite, UA: NEGATIVE
Protein, UA: NEGATIVE
Urobilinogen, UA: 0.2 E.U./dL
pH, UA: 5 (ref 5.0–8.0)

## 2016-12-10 MED ORDER — NITROFURANTOIN MONOHYD MACRO 100 MG PO CAPS: 100.0000 mg | ORAL_CAPSULE | Freq: Two times a day (BID) | ORAL | 0 refills | Status: DC

## 2016-12-10 NOTE — Patient Instructions (Signed)
Will get pelvic Ultrasound and follow

## 2016-12-10 NOTE — Progress Notes (Signed)
56 y.o.Married Caucasian female [phone] here with complaint of UTI, with onset  on 1-2 days ago. Patient complaining of:  dysuria and some upper back pain on right. Patient denies fever and nausea. Chills sometimes. No new personal products. Patient feels some pain to sexual activity a few days ago. Noticed today some vaginal spotting that was pink to brown in color.  Contraception is post-menopause.  No change in partner. Some vaginal dryness. Patient does have adequate water intake.  Past Medical History:  Diagnosis Date  . Abnormal Pap smear of cervix    with conization  . Anxiety   . Arthritis   . Breast cancer ([place]) 05/05/12   right, ER +, PR -, Her 2 -  . Depression   . H/O dizziness   . History of radiation therapy 07/14/2012-09/03/12   right chest wall 59.4 gray  . Sleep apnea    cpap-has not been using-doing well without it    Past Surgical History:  Procedure Laterality Date  . BREAST RECONSTRUCTION WITH PLACEMENT OF TISSUE EXPANDER AND FLEX HD (ACELLULAR HYDRATED DERMIS) Left 06/06/2013   Procedure: PLACEMENT OF LEFT BREAST TISSUE EXPANDER AND FLEX HD ;  Surgeon: Theodoro Kos, DO;  Location: [place];  Service: Plastics;  Laterality: Left;  . BREAST SURGERY     several biopsies, needle aspiration  . CERVICAL CONIZATION W/BX    . CESAREAN SECTION     x3  . COSMETIC SURGERY  04/19/14   Dr. Migdalia Dk, mini tummy tuck, smoothed out right breast, left breast lift  . DILATATION & CURRETTAGE/HYSTEROSCOPY WITH RESECTOCOPE N/A 06/12/2014   Procedure: DILATATION & CURETTAGE/HYSTEROSCOPY ;  Surgeon: Lyman Speller, MD;  Location: [facility];  Service: Gynecology;  Laterality: N/A;  . JOINT REPLACEMENT     Rt and Lt thumbs  . LATISSIMUS FLAP TO BREAST Right 06/06/2013   Procedure: RIGHT LATISSIMUS MYOCUTANEOUS FLAP WITH PLACEMENT OF TISSUE EXPANDER IN RIGHT BREAST ;  Surgeon: Theodoro Kos, DO;  Location: [place];  Service: Plastics;  Laterality: Right;  . MASTECTOMY MODIFIED RADICAL   06/07/2012   Procedure: MASTECTOMY MODIFIED RADICAL;  Surgeon: Haywood Lasso, MD;  Location: [place];  Service: General;  Laterality: Right;  . MASTECTOMY W/ SENTINEL NODE BIOPSY  06/07/2012   Procedure: MASTECTOMY WITH SENTINEL LYMPH NODE BIOPSY;  Surgeon: Haywood Lasso, MD;  Location: [place];  Service: General;  Laterality: Right;  right total mastectomy and sentinel node  . REMOVAL OF BILATERAL TISSUE EXPANDERS WITH PLACEMENT OF BILATERAL BREAST IMPLANTS Bilateral 11/10/2013   Procedure: REMOVAL OF BILATERAL TISSUE EXPANDERS WITH PLACEMENT OF SILICONE BREAST IMPLANTS WITH BILATERAL CAPSULOTOMIES;  Surgeon: Theodoro Kos, DO;  Location: [place];  Service: Plastics;  Laterality: Bilateral;  . SIMPLE MASTECTOMY WITH AXILLARY SENTINEL NODE BIOPSY Left 09/30/2012   Procedure: SIMPLE MASTECTOMY;  Surgeon: Haywood Lasso, MD;  Location: [place];  Service: General;  Laterality: Left;  left total mastectomy  . TUBAL LIGATION      Current Outpatient Prescriptions  Medication Sig Dispense Refill  . NON FORMULARY Juice Plus: Greens, reds, purples    . NON FORMULARY Juice plus omega 3     No current facility-administered medications for this visit.     ALLERGIES: Doxycycline  PHYSICAL EXAMINATION:  BP 90/60 (BP Location: Right Arm, Patient Position: Sitting, Cuff Size: Normal)   Pulse 76   Resp 16   Ht 5\' 4"  (1.626 m)   Wt 123 lb (55.8 kg)   BMI 21.11  kg/m  Healthy female WDWN Affect: Normal, orientation x 3 Skin : warm and dry CVAT: negative bilateral Abdomen: negative for suprapubic tenderness  Pelvic exam: External genital area: normal, no lesions Bladder,Urethra tender, Urethral meatus is slight prolapsed Vagina: brown vaginal discharge, normal appearance except very tender even with use of a small speculum Cervix: hard to visualize Uterus: not done due to pain Adnexa: not done due to pain Additional note:  Pt had to apply warm/ hot  compress to external vulva for comfort for about 10 minutes.  She was given 2 Advil and drink.  She was comfortable at time of discharge.  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  Rx:  Macrobid 100 mg BID # 14 [id number] micro, culture  She is scheduled to return for PUS to R/O PMB vs. Vaginal pain from dryness and dyspareunia. Reviewed warning signs and symptoms of UTI and need to advise if occurring. Encouraged to limit soda, tea, and coffee   RV prn

## 2016-12-11 ENCOUNTER — Other Ambulatory Visit: Payer: Self-pay

## 2016-12-11 ENCOUNTER — Telehealth: Payer: Self-pay | Admitting: Obstetrics & Gynecology

## 2016-12-11 ENCOUNTER — Ambulatory Visit (INDEPENDENT_AMBULATORY_CARE_PROVIDER_SITE_OTHER): Payer: 59

## 2016-12-11 ENCOUNTER — Ambulatory Visit (INDEPENDENT_AMBULATORY_CARE_PROVIDER_SITE_OTHER): Payer: 59 | Admitting: Obstetrics & Gynecology

## 2016-12-11 VITALS — BP 94/60 | HR 76 | Resp 14 | Ht 64.0 in | Wt 123.0 lb

## 2016-12-11 DIAGNOSIS — N952 Postmenopausal atrophic vaginitis: Secondary | ICD-10-CM | POA: Diagnosis not present

## 2016-12-11 DIAGNOSIS — N950 Postmenopausal bleeding: Secondary | ICD-10-CM

## 2016-12-11 DIAGNOSIS — R3915 Urgency of urination: Secondary | ICD-10-CM | POA: Diagnosis not present

## 2016-12-11 LAB — URINALYSIS, MICROSCOPIC ONLY
Bacteria, UA: NONE SEEN [HPF]
Casts: NONE SEEN [LPF]
Crystals: NONE SEEN [HPF]
RBC / HPF: NONE SEEN RBC/HPF (ref <=2)
Yeast: NONE SEEN [HPF]

## 2016-12-11 LAB — URINE CULTURE: Organism ID, Bacteria: NO GROWTH

## 2016-12-11 MED ORDER — NONFORMULARY OR COMPOUNDED ITEM: 3 refills | Status: DC

## 2016-12-11 NOTE — Telephone Encounter (Signed)
Called patient to review benefits for a scheduled ultrasound. Left Voicemail requesting a call back.

## 2016-12-11 NOTE — Progress Notes (Signed)
Reviewed personally.  M. Suzanne Obdulia Steier, MD.  

## 2016-12-11 NOTE — Progress Notes (Signed)
GYNECOLOGY  VISIT   HPI: 56 y.o. [phone] Married Caucasian female here for PUS due to PMP bleeding as well as follow-up due to UTI-like symptoms.  Pt was started on macrobid two days ago.  Urine culture is negative.  She had many WBCs on urine micro.  She is still having symptoms today and desires additional evaluation.  She is feeling urinary urgency as well as urinary frequency.  Denies dysuria.  Denies hematuria.  Denies fever.  Denies back pain.  Ultrasound performed today Uterus: 5.5 x 4.0 x 3.7cm Endometrium:  4.4mm, sliver of fluid noted.  (Total endometrial thickness 3.50mm) Left ovary:  1.4 x 1.0 x 0.6cm Right ovary:  1.4 x 1.2 x 0.8cm Cul de sac:  No free fluid  GYNECOLOGIC HISTORY: No LMP recorded. Patient is not currently having periods (Reason: Perimenopausal). Contraception: PMP Menopausal hormone therapy: none  Patient Active Problem List   Diagnosis Date Noted  . Breast replaced by other means 11/15/2013  . Status post bilateral breast implants 11/15/2013  . Closed fracture of multiple ribs 10/21/2013  . Low back pain 05/31/2013  . Infected seroma, postoperative 10/18/2012  . Right breast cancer, IDC, multicentric 05/05/2012    Past Medical History:  Diagnosis Date  . Abnormal Pap smear of cervix    with conization  . Anxiety   . Arthritis   . Breast cancer ([place]) 05/05/12   right, ER +, PR -, Her 2 -  . Depression   . H/O dizziness   . History of radiation therapy 07/14/2012-09/03/12   right chest wall 59.4 gray  . Sleep apnea    cpap-has not been using-doing well without it    Past Surgical History:  Procedure Laterality Date  . BREAST RECONSTRUCTION WITH PLACEMENT OF TISSUE EXPANDER AND FLEX HD (ACELLULAR HYDRATED DERMIS) Left 06/06/2013   Procedure: PLACEMENT OF LEFT BREAST TISSUE EXPANDER AND FLEX HD ;  Surgeon: Theodoro Kos, DO;  Location: [place];  Service: Plastics;  Laterality: Left;  . BREAST SURGERY     several biopsies, needle aspiration  .  CERVICAL CONIZATION W/BX    . CESAREAN SECTION     x3  . COSMETIC SURGERY  04/19/14   Dr. Migdalia Dk, mini tummy tuck, smoothed out right breast, left breast lift  . DILATATION & CURRETTAGE/HYSTEROSCOPY WITH RESECTOCOPE N/A 06/12/2014   Procedure: DILATATION & CURETTAGE/HYSTEROSCOPY ;  Surgeon: Lyman Speller, MD;  Location: [facility];  Service: Gynecology;  Laterality: N/A;  . JOINT REPLACEMENT     Rt and Lt thumbs  . LATISSIMUS FLAP TO BREAST Right 06/06/2013   Procedure: RIGHT LATISSIMUS MYOCUTANEOUS FLAP WITH PLACEMENT OF TISSUE EXPANDER IN RIGHT BREAST ;  Surgeon: Theodoro Kos, DO;  Location: [place];  Service: Plastics;  Laterality: Right;  . MASTECTOMY MODIFIED RADICAL  06/07/2012   Procedure: MASTECTOMY MODIFIED RADICAL;  Surgeon: Haywood Lasso, MD;  Location: [place];  Service: General;  Laterality: Right;  . MASTECTOMY W/ SENTINEL NODE BIOPSY  06/07/2012   Procedure: MASTECTOMY WITH SENTINEL LYMPH NODE BIOPSY;  Surgeon: Haywood Lasso, MD;  Location: [place];  Service: General;  Laterality: Right;  right total mastectomy and sentinel node  . REMOVAL OF BILATERAL TISSUE EXPANDERS WITH PLACEMENT OF BILATERAL BREAST IMPLANTS Bilateral 11/10/2013   Procedure: REMOVAL OF BILATERAL TISSUE EXPANDERS WITH PLACEMENT OF SILICONE BREAST IMPLANTS WITH BILATERAL CAPSULOTOMIES;  Surgeon: Theodoro Kos, DO;  Location: [place];  Service: Plastics;  Laterality: Bilateral;  . SIMPLE MASTECTOMY WITH AXILLARY SENTINEL NODE BIOPSY  Left 09/30/2012   Procedure: SIMPLE MASTECTOMY;  Surgeon: Haywood Lasso, MD;  Location: [place];  Service: General;  Laterality: Left;  left total mastectomy  . TUBAL LIGATION      MEDS:  Reviewed in EPIC and UTD  ALLERGIES: Doxycycline  Family History  Problem Relation Age of Onset  . Breast cancer Mother 47  . Lymphoma Mother 73       non-hodgkins lymphoma in her epiglottis  . Cancer Mother        non-hodkins lymphoma, breast    . Hypertension Mother   . Heart disease Mother   . Deep vein thrombosis Mother   . Thyroid disease Mother   . Osteoporosis Mother   . Stroke Maternal Grandfather   . Bipolar disorder Brother   . Gout Brother   . Breast cancer Other        MGM's sister; dx <50  . Breast cancer Other 8       MGF's sister  . Brain cancer Other        MGM's sister; dx <50  . Leukemia Other        mother's maternal cousin; dx under 57    SH:  Married, non smoker  Review of Systems  Gastrointestinal: Negative for abdominal pain, constipation and diarrhea.  Genitourinary: Positive for frequency and urgency. Negative for dysuria and hematuria.  All other systems reviewed and are negative.   PHYSICAL EXAMINATION:    BP 94/60 (BP Location: Left Arm, Patient Position: Sitting, Cuff Size: Normal)   Pulse 76   Resp 14   Ht 5\' 4"  (1.626 m)   Wt 123 lb (55.8 kg)   BMI 21.11 kg/m     General appearance: alert, cooperative and appears stated age Abdomen: soft, non-tender; bowel sounds normal; no masses,  no organomegaly  Pelvic: External genitalia:  no lesions              Urethra:  normal appearing urethra with no masses, tenderness or lesions              Bartholins and Skenes: normal                 Vagina: normal appearing vagina with significant erythema and yellowish/brownish discharge              Cervix: no lesions              Bimanual Exam:  Uterus:  normal size, contour, position, consistency, mobility, non-tender              Adnexa: no mass, fullness, tenderness              Anus:  normal sphincter tone, no lesions  Endometrial biopsy recommended.  Pt apprehensive to attempt due to vaginal pain.  Agrees to try.  Speculum placed.  Due to pain, pt declined to proceed.  Speculum was removed.    Chaperone was present for exam.  Assessment: Significant atrophic vaginitis Negative urine culture with urinary urgency and frequency H/O breast cancer  Plan: Will try Vit E suppositories  PV three times weekly.  May need to use vaginal hydrocortisone suppositories Ok to use pyridium for urinary symptoms Recheck 3 months for AEX.  May need to consider attempt at endometrial biopsy at that time.   ~20 minutes spent with patient >50% of time was in face to face discussion of above.

## 2016-12-11 NOTE — Telephone Encounter (Signed)
Patient returned call. Reviewed benefit for ultrasound. Patient understood information presented. Patient is scheduled 12/11/16  with Dr Sabra Heck. Patient aware of date and arrival time for appointment today, patient is agreeable. No further questions. Ok to close

## 2016-12-14 ENCOUNTER — Encounter: Payer: Self-pay | Admitting: Obstetrics & Gynecology

## 2017-02-26 DIAGNOSIS — M545 Low back pain: Secondary | ICD-10-CM | POA: Diagnosis not present

## 2017-02-26 DIAGNOSIS — R293 Abnormal posture: Secondary | ICD-10-CM | POA: Diagnosis not present

## 2017-02-26 DIAGNOSIS — M2560 Stiffness of unspecified joint, not elsewhere classified: Secondary | ICD-10-CM | POA: Diagnosis not present

## 2017-03-02 ENCOUNTER — Other Ambulatory Visit (HOSPITAL_BASED_OUTPATIENT_CLINIC_OR_DEPARTMENT_OTHER): Payer: 59

## 2017-03-02 DIAGNOSIS — C50911 Malignant neoplasm of unspecified site of right female breast: Secondary | ICD-10-CM

## 2017-03-02 LAB — CBC WITH DIFFERENTIAL/PLATELET
BASO%: 1.1 % (ref 0.0–2.0)
Basophils Absolute: 0.1 10*3/uL (ref 0.0–0.1)
EOS%: 2.7 % (ref 0.0–7.0)
Eosinophils Absolute: 0.1 10*3/uL (ref 0.0–0.5)
HCT: 44 % (ref 34.8–46.6)
HGB: 14.8 g/dL (ref 11.6–15.9)
LYMPH%: 20.9 % (ref 14.0–49.7)
MCH: 29.8 pg (ref 25.1–34.0)
MCHC: 33.6 g/dL (ref 31.5–36.0)
MCV: 88.6 fL (ref 79.5–101.0)
MONO#: 0.3 10*3/uL (ref 0.1–0.9)
MONO%: 6.9 % (ref 0.0–14.0)
NEUT#: 3.2 10*3/uL (ref 1.5–6.5)
NEUT%: 68.4 % (ref 38.4–76.8)
Platelets: 274 10*3/uL (ref 145–400)
RBC: 4.97 10*6/uL (ref 3.70–5.45)
RDW: 13.5 % (ref 11.2–14.5)
WBC: 4.7 10*3/uL (ref 3.9–10.3)
lymph#: 1 10*3/uL (ref 0.9–3.3)

## 2017-03-02 LAB — COMPREHENSIVE METABOLIC PANEL
ALT: 23 U/L (ref 0–55)
AST: 25 U/L (ref 5–34)
Albumin: 3.9 g/dL (ref 3.5–5.0)
Alkaline Phosphatase: 135 U/L (ref 40–150)
Anion Gap: 8 mEq/L (ref 3–11)
BUN: 13.9 mg/dL (ref 7.0–26.0)
CO2: 32 mEq/L — ABNORMAL HIGH (ref 22–29)
Calcium: 10.3 mg/dL (ref 8.4–10.4)
Chloride: 102 mEq/L (ref 98–109)
Creatinine: 0.8 mg/dL (ref 0.6–1.1)
EGFR: 78 mL/min/{1.73_m2} — ABNORMAL LOW (ref >90)
Glucose: 96 mg/dl (ref 70–140)
Potassium: 4.3 mEq/L (ref 3.5–5.1)
Sodium: 142 mEq/L (ref 136–145)
Total Bilirubin: 0.75 mg/dL (ref 0.20–1.20)
Total Protein: 7.6 g/dL (ref 6.4–8.3)

## 2017-03-03 LAB — CANCER ANTIGEN 27.29: CA 27.29: 25.5 U/mL (ref 0.0–38.6)

## 2017-03-04 DIAGNOSIS — R293 Abnormal posture: Secondary | ICD-10-CM | POA: Diagnosis not present

## 2017-03-04 DIAGNOSIS — M545 Low back pain: Secondary | ICD-10-CM | POA: Diagnosis not present

## 2017-03-04 DIAGNOSIS — M2560 Stiffness of unspecified joint, not elsewhere classified: Secondary | ICD-10-CM | POA: Diagnosis not present

## 2017-03-09 ENCOUNTER — Ambulatory Visit (HOSPITAL_BASED_OUTPATIENT_CLINIC_OR_DEPARTMENT_OTHER): Payer: 59 | Admitting: Oncology

## 2017-03-09 DIAGNOSIS — Z853 Personal history of malignant neoplasm of breast: Secondary | ICD-10-CM

## 2017-03-09 DIAGNOSIS — C50811 Malignant neoplasm of overlapping sites of right female breast: Secondary | ICD-10-CM

## 2017-03-09 DIAGNOSIS — Z170 Estrogen receptor positive status [ER+]: Secondary | ICD-10-CM | POA: Insufficient documentation

## 2017-03-09 DIAGNOSIS — R293 Abnormal posture: Secondary | ICD-10-CM | POA: Diagnosis not present

## 2017-03-09 DIAGNOSIS — M2560 Stiffness of unspecified joint, not elsewhere classified: Secondary | ICD-10-CM | POA: Diagnosis not present

## 2017-03-09 DIAGNOSIS — M545 Low back pain: Secondary | ICD-10-CM | POA: Diagnosis not present

## 2017-03-09 NOTE — Progress Notes (Signed)
. Kelly Kelly [name]   DOB: May 13, 1961  MR#: [id number]  CSN#:[id number]  PCP:  Kelly Kelly [name] GYN: Kelly Kelly [name], SU: Kelly Kelly [name] OTHER MD: Kelly Kelly [name], Kelly Kelly [name], Kelly Kelly [name], Kelly Kelly [name], Kelly Kelly [name]  CHIEF COMPLAINT: follow up of right breast cancer  CURRENT TREATMENT: Observation  BREAST CANCER HISTORY:  From the prior summary:  The patient herself noted a change in her right breast. She brought it to her primary [name] physician, Dr. Carron Kelly [name] attention, and diagnostic mammography was obtained at [facility] 05/05/2012. The breasts were found to be heterogeneously dense. There was a spiculated mass posteriorly and laterally to the nipple line in the right breast, and a second mass more medially. There was an asymmetric soft tissue density in the upper outer quadrant of the left breast, without any discrete mass identified. There was a cluster of microcalcifications medially in the left breast. Ultrasound of the right breast showed a lobulated mass measuring 2.3 cm and a second mass measuring 4.5 cm. There was also a third mass measuring 1.9 cm which was felt possibly to represent a lymph node. In the right axilla there was a suspicious lymph node measuring 1.1 cm.  Biopsy of this 3 breast masses was obtained the same day, and showed all 3 to represent invasive ductal carcinoma, grade 1. Because the 3 lesions were morphologically similar, only one prognostic panel was obtained (from the mass at "7:00"), showing it to be estrogen receptor 100% positive, progesterone receptor negative, with an MIB-1 of 12%, and no HER-2 amplification ([id number]).  Bilateral breast MRI was obtained 05/10/2012, and confirmed the 3 right breast masses in question, measuring 2.1, 2.8 (at the 7:00 position) and 2.2 cm. In addition a 1.3 cm right axillary lymph node was noted. There were no suspicious findings in the left breast. On October 30 fine-needle aspiration of the suspicious right  axilla lymph node was performed, with the results being negative ([id number]).   Finally on 06/07/2012 the patient underwent right modified radical mastectomy. The three masses in the right breast measured 2.2 cm, 3 cm, and 2.3 cm ([id number]). The 2.3 cm mass was morphologically slightly different (grade 2) and a prognostic profile was obtained from this mass ("superior mid-medial"). It was 100% estrogen and 98% progesterone receptor positive. MIB-1 was 16%. There was no HER-2 amplification. A total of 19 lymph nodes were sampled, of which 9 were positive. Margins were negative, but close (1 mm). The patient's subsequent history Kelly [name] as detailed below.  INTERVAL HISTORY: Kelly Kelly [name] returns today for follow-up of her estrogen receptor positive breast cancer. She was offered antiestrogen's but declined so Kelly [name] under observation alone.  Since the last visit here she had an abdominal MRI which showed no hepatic abnormality and no lymphadenopathy as well as a normal pancreas, on 01/24/2016. MRI of the lumbar spine on the same day shows significant degenerative disc disease but no evidence of cancer.  She Kelly [name] now 5 years out from definitive surgery and ready to "graduate".  REVIEW OF SYSTEMS: She traveled to [place], got certified as a Kelly Kelly [name], and Kelly [name] going to be working through [employer] at [employer]. She Kelly [name] exploring multiple alternative and complementary treatment options. Overall however a detailed review of systems today was benign   PAST MEDICAL HISTORY: Past Medical History:  Diagnosis Date  . Abnormal Pap smear of cervix    with conization  . Anxiety   . Arthritis   . Breast cancer ([place]) 05/05/12  right, ER +, PR -, Her 2 -  . Depression   . H/O dizziness   . History of radiation therapy 07/14/2012-09/03/12   right chest wall 59.4 gray  . Sleep apnea    cpap-has not been using-doing well without it   sleep apnea; migraines;  PAST SURGICAL HISTORY: Past Surgical History:   Procedure Laterality Date  . BREAST RECONSTRUCTION WITH PLACEMENT OF TISSUE EXPANDER AND FLEX HD (ACELLULAR HYDRATED DERMIS) Left 06/06/2013   Procedure: PLACEMENT OF LEFT BREAST TISSUE EXPANDER AND FLEX HD ;  Surgeon: Kelly Kos, DO;  Location: [place];  Service: Plastics;  Laterality: Left;  . BREAST SURGERY     several biopsies, needle aspiration  . CERVICAL CONIZATION W/BX    . CESAREAN SECTION     x3  . COSMETIC SURGERY  04/19/14   Dr. Migdalia [name], mini tummy tuck, smoothed out right breast, left breast lift  . DILATATION & CURRETTAGE/HYSTEROSCOPY WITH RESECTOCOPE N/A 06/12/2014   Procedure: DILATATION & CURETTAGE/HYSTEROSCOPY ;  Surgeon: Kelly Speller, MD;  Location: [facility];  Service: Gynecology;  Laterality: N/A;  . JOINT REPLACEMENT     Rt and Lt thumbs  . LATISSIMUS FLAP TO BREAST Right 06/06/2013   Procedure: RIGHT LATISSIMUS MYOCUTANEOUS FLAP WITH PLACEMENT OF TISSUE EXPANDER IN RIGHT BREAST ;  Surgeon: Kelly Kos, DO;  Location: [facility];  Service: Plastics;  Laterality: Right;  . MASTECTOMY MODIFIED RADICAL  06/07/2012   Procedure: MASTECTOMY MODIFIED RADICAL;  Surgeon: Kelly Lasso, MD;  Location: [facility];  Service: General;  Laterality: Right;  . MASTECTOMY W/ SENTINEL NODE BIOPSY  06/07/2012   Procedure: MASTECTOMY WITH SENTINEL LYMPH NODE BIOPSY;  Surgeon: Kelly Lasso, MD;  Location: [facility];  Service: General;  Laterality: Right;  right total mastectomy and sentinel node  . REMOVAL OF BILATERAL TISSUE EXPANDERS WITH PLACEMENT OF BILATERAL BREAST IMPLANTS Bilateral 11/10/2013   Procedure: REMOVAL OF BILATERAL TISSUE EXPANDERS WITH PLACEMENT OF SILICONE BREAST IMPLANTS WITH BILATERAL CAPSULOTOMIES;  Surgeon: Kelly Kos, DO;  Location: [facility];  Service: Plastics;  Laterality: Bilateral;  . SIMPLE MASTECTOMY WITH AXILLARY SENTINEL NODE BIOPSY Left 09/30/2012   Procedure: SIMPLE MASTECTOMY;  Surgeon: Kelly Lasso, MD;  Location: [facility];  Service: General;  Laterality: Left;  left total mastectomy  . TUBAL LIGATION      FAMILY HISTORY Family History  Problem Relation Age of Onset  . Breast cancer Mother 31  . Lymphoma Mother 81       non-hodgkins lymphoma in her epiglottis  . Cancer Mother        non-hodkins lymphoma, breast  . Hypertension Mother   . Heart disease Mother   . Deep vein thrombosis Mother   . Thyroid disease Mother   . Osteoporosis Mother   . Stroke Maternal Grandfather   . Bipolar disorder Brother   . Gout Brother   . Breast cancer Other        MGM's sister; dx <50  . Breast cancer Other 34       MGF's sister  . Brain cancer Other        MGM's sister; dx <50  . Leukemia Other        mother's maternal cousin; dx under 76  the patient's mother, Farrell Broerman, Kelly [name] also my patient and has a history of breast cancer. The patient's father was adopted. Kelly Kelly [name] has 2 brothers, no sisters. There Kelly [name] no history of ovarian cancer in the family.  GYNECOLOGIC  HISTORY: She had menarche age 67, first live birth age 35. She Kelly [name] GX P3. LMP was in 2013, now being evaluated by gynecology and will undergo uterine biopsy due to vaginal spotting.  SOCIAL HISTORY: [employer] conservation [employer] ([employer] and for other causes. Her husband Kelly Kelly [name] works for [employer] in [employer]. Their children are currently 32 years old,  3 (chemistry major at [place] [employer]), and 10.   ADVANCED DIRECTIVES: Not in place  HEALTH MAINTENANCE: Social History  Substance Use Topics  . Smoking status: Never Smoker  . Smokeless tobacco: Never Used  . Alcohol use 0.0 oz/week     Comment: socially    Colonoscopy: 2012  PAP: unsure of date  Bone density: 08/11/2012 at [facility], osteopenia  Lipid panel: August 2013, Dr. Inis Sizer  Allergies  Allergen Reactions  . Doxycycline Rash    Current Outpatient Prescriptions  Medication Sig Dispense Refill  . nitrofurantoin,  macrocrystal-monohydrate, (MACROBID) 100 MG capsule Take 1 capsule (100 mg total) by mouth 2 (two) times daily. 14 capsule 0  . NON FORMULARY Juice Plus: Greens, reds, purples    . NON FORMULARY Juice plus omega 3    . NONFORMULARY OR COMPOUNDED ITEM Vit E 200u/ml.  One suppository pv three time weekly. 36 each 3   No current facility-administered medications for this visit.     OBJECTIVE: Middle-aged white womanWho appears well  Vitals:   03/09/17 1218  BP: 101/62  Pulse: 70  Resp: 18  Temp: 98.4 F (36.9 C)  SpO2: 99%     Body mass index Kelly [name] 21.75 kg/m.    ECOG FS: 1 Filed Weights   03/09/17 1218  Weight: 126 lb 11.2 oz (57.5 kg)   Sclerae unicteric, EOMs intact Oropharynx clear and moist No cervical or supraclavicular adenopathy Lungs no rales or rhonchi Heart regular rate and rhythm Abd soft, nontender, positive bowel sounds MSK no focal spinal tenderness, no upper extremity lymphedema Neuro: nonfocal, well oriented, appropriate affect Breasts: Status post bilateral mastectomies with bilateral implant reconstruction. There Kelly [name] no evidence of local recurrence. Both axillae are benign.   LAB RESULTS: Lab Results  Component Value Date   WBC 4.7 03/02/2017   NEUTROABS 3.2 03/02/2017   HGB 14.8 03/02/2017   HCT 44.0 03/02/2017   MCV 88.6 03/02/2017   PLT 274 03/02/2017      Chemistry      Component Value Date/Time   NA 142 03/02/2017 1112   K 4.3 03/02/2017 1112   CL 103 01/14/2016 1915   CL 102 12/27/2012 0935   CO2 32 (H) 03/02/2017 1112   BUN 13.9 03/02/2017 1112   CREATININE 0.8 03/02/2017 1112      Component Value Date/Time   CALCIUM 10.3 03/02/2017 1112   ALKPHOS 135 03/02/2017 1112   AST 25 03/02/2017 1112   ALT 23 03/02/2017 1112   BILITOT 0.75 03/02/2017 1112       Lab Results  Component Value Date   LABCA2 20 03/10/2016     STUDIES: No results found.  ASSESSMENT: 56 y.o. BRCA negative [place] woman status post right modified  radical mastectomy 06/07/2012 for a multifocal invasive ductal carcinoma, the largest of 3 masses measuring 3.0 cm, grade 1; a second mass measuring 2.3 cm, grade 2; and a third mass measuring 2.2 cm, grade 1. The 2 larger masses were both 100% estrogen receptor positive, the grade 2 mass being also progesterone receptor positive at 98%, with an MIB-1 of 16% (the larger mass was  progesterone receptor negative, with an Mib-1 of 12%). Both showed no HER-2 amplification. 9 of 19 axillary lymph nodes sampled were involved. In summary:  (1) in short, status post right modified radical mastectomy November 2013 for a mpT2 pN2a, stage IIIA invasive ductal carcinoma, grade 1-2, estrogen receptor positive, HER-2 not amplified, with an MIB-1 of 12-16%  (2) the patient opted to forego adjuvant chemotherapy  (3) adjuvant radiation therapy completed 09/03/2012   (4)  started on Tamoxifen, but she "never really took this" and declines further anti-estrogen therapy.    (5) left simple mastectomy 09/30/2012, with benign pathology; complicated by an infected seroma MRSA infection.  (6) patient Kelly [name] s/p breast reconstruction.   (7) neck pain with CT neck negative for metastatic disease, also worsening back pain.  (8) history of major depressive disorder, status post transcranial magnetic simulation treatment completed December 2015    PLAN:  Kelly Kelly [name] now 5 years out from definitive surgery for her breast cancer with no evidence of disease recurrence. This Kelly [name] very favorable.  At this point she may "graduate" from follow-up here. However I think she would be an excellent candidate for survivorship program. We discussed this at length today and she Kelly [name] interested in participating. I am placing an appointment for her in September.  I am not making a follow-up appointment for [facility] with me here, but she knows I will be glad to see her at any point in the future if on when a new problem arises.

## 2017-03-11 DIAGNOSIS — R293 Abnormal posture: Secondary | ICD-10-CM | POA: Diagnosis not present

## 2017-03-11 DIAGNOSIS — M2560 Stiffness of unspecified joint, not elsewhere classified: Secondary | ICD-10-CM | POA: Diagnosis not present

## 2017-03-11 DIAGNOSIS — M545 Low back pain: Secondary | ICD-10-CM | POA: Diagnosis not present

## 2017-03-16 DIAGNOSIS — M545 Low back pain: Secondary | ICD-10-CM | POA: Diagnosis not present

## 2017-03-16 DIAGNOSIS — R293 Abnormal posture: Secondary | ICD-10-CM | POA: Diagnosis not present

## 2017-03-16 DIAGNOSIS — M2560 Stiffness of unspecified joint, not elsewhere classified: Secondary | ICD-10-CM | POA: Diagnosis not present

## 2017-03-26 DIAGNOSIS — R293 Abnormal posture: Secondary | ICD-10-CM | POA: Diagnosis not present

## 2017-03-26 DIAGNOSIS — M545 Low back pain: Secondary | ICD-10-CM | POA: Diagnosis not present

## 2017-03-26 DIAGNOSIS — M2560 Stiffness of unspecified joint, not elsewhere classified: Secondary | ICD-10-CM | POA: Diagnosis not present

## 2017-04-02 DIAGNOSIS — R293 Abnormal posture: Secondary | ICD-10-CM | POA: Diagnosis not present

## 2017-04-02 DIAGNOSIS — M2560 Stiffness of unspecified joint, not elsewhere classified: Secondary | ICD-10-CM | POA: Diagnosis not present

## 2017-04-02 DIAGNOSIS — M545 Low back pain: Secondary | ICD-10-CM | POA: Diagnosis not present

## 2017-04-02 NOTE — Progress Notes (Addendum)
56 y.o. [id number] Married Caucasian F here for annual exam.  Doing well.  Denies vaginal bleeding.  Having a little incontinence but this is very mild.  She went to [place] this summer.  Went to [place].    Has some complaint of intermittent urine odor.  No LMP recorded. Patient is not currently having periods (Reason: Perimenopausal).          Sexually active: Yes.    The current method of family planning is tubal ligation.    Exercising: Yes.    yoga, walking, bar sculpt classes, body pump  Smoker:  no  Health Maintenance: Pap:  01/03/16 negative, 10/17/14 negative, 10/10/13 negative, HR HPV negative  History of abnormal Pap:  yes MMG:  S/P mastectomy  Colonoscopy:  1/13 Dr. Collene Mares- repeat 10 years  BMD:   08/11/12 osteopenia  TDaP:  10/10/13  Pneumonia vaccine(s):  never Zostavax:   never Hep C testing: 01/03/16 negative  Screening Labs: discuss with provider, Hb today: discuss with provider, Urine today: has urine sample if needed    reports that she has never smoked. She has never used smokeless tobacco. She reports that she drinks alcohol. She reports that she does not use drugs.  Past Medical History:  Diagnosis Date  . Abnormal Pap smear of cervix    with conization  . Anxiety   . Arthritis   . Breast cancer ([place]) 05/05/12   right, ER +, PR -, Her 2 -  . Depression   . H/O dizziness   . History of radiation therapy 07/14/2012-09/03/12   right chest wall 59.4 gray  . Scoliosis    Followed by Dr. Harlow Asa   . Sleep apnea    cpap-has not been using-doing well without it    Past Surgical History:  Procedure Laterality Date  . BREAST RECONSTRUCTION WITH PLACEMENT OF TISSUE EXPANDER AND FLEX HD (ACELLULAR HYDRATED DERMIS) Left 06/06/2013   Procedure: PLACEMENT OF LEFT BREAST TISSUE EXPANDER AND FLEX HD ;  Surgeon: Theodoro Kos, DO;  Location: [facility];  Service: Plastics;  Laterality: Left;  . BREAST SURGERY     several biopsies, needle aspiration  . CERVICAL  CONIZATION W/BX    . CESAREAN SECTION     x3  . COSMETIC SURGERY  04/19/14   Dr. Migdalia Dk, mini tummy tuck, smoothed out right breast, left breast lift  . DILATATION & CURRETTAGE/HYSTEROSCOPY WITH RESECTOCOPE N/A 06/12/2014   Procedure: DILATATION & CURETTAGE/HYSTEROSCOPY ;  Surgeon: Lyman Speller, MD;  Location: [facility];  Service: Gynecology;  Laterality: N/A;  . JOINT REPLACEMENT     Rt and Lt thumbs  . LATISSIMUS FLAP TO BREAST Right 06/06/2013   Procedure: RIGHT LATISSIMUS MYOCUTANEOUS FLAP WITH PLACEMENT OF TISSUE EXPANDER IN RIGHT BREAST ;  Surgeon: Theodoro Kos, DO;  Location: [facility];  Service: Plastics;  Laterality: Right;  . MASTECTOMY MODIFIED RADICAL  06/07/2012   Procedure: MASTECTOMY MODIFIED RADICAL;  Surgeon: Haywood Lasso, MD;  Location: [facility];  Service: General;  Laterality: Right;  . MASTECTOMY W/ SENTINEL NODE BIOPSY  06/07/2012   Procedure: MASTECTOMY WITH SENTINEL LYMPH NODE BIOPSY;  Surgeon: Haywood Lasso, MD;  Location: [facility];  Service: General;  Laterality: Right;  right total mastectomy and sentinel node  . REMOVAL OF BILATERAL TISSUE EXPANDERS WITH PLACEMENT OF BILATERAL BREAST IMPLANTS Bilateral 11/10/2013   Procedure: REMOVAL OF BILATERAL TISSUE EXPANDERS WITH PLACEMENT OF SILICONE BREAST IMPLANTS WITH BILATERAL CAPSULOTOMIES;  Surgeon: Theodoro Kos, DO;  Location: [facility];  Service: Plastics;  Laterality: Bilateral;  . SIMPLE MASTECTOMY WITH AXILLARY SENTINEL NODE BIOPSY Left 09/30/2012   Procedure: SIMPLE MASTECTOMY;  Surgeon: Haywood Lasso, MD;  Location: [facility];  Service: General;  Laterality: Left;  left total mastectomy  . TUBAL LIGATION      Current Outpatient Prescriptions  Medication Sig Dispense Refill  . CHLOROPHYLL PO Take by mouth.    . Cholecalciferol (VITAMIN D PO) Take by mouth.    . Cyanocobalamin (VITAMIN B-12 PO) Take by mouth.    Marland Kitchen GINKGO BILOBA PO Take by mouth.    . NON FORMULARY Juice  Plus: Greens, reds, purples    . NON FORMULARY Juice plus omega 3    . NON FORMULARY Herbal Tea    . NON FORMULARY spirilina    . NONFORMULARY OR COMPOUNDED ITEM Vit E 200u/ml.  One suppository pv three time weekly. 36 each 3   No current facility-administered medications for this visit.     Family History  Problem Relation Age of Onset  . Breast cancer Mother 55  . Lymphoma Mother 61       non-hodgkins lymphoma in her epiglottis  . Cancer Mother        non-hodkins lymphoma, breast  . Hypertension Mother   . Heart disease Mother   . Deep vein thrombosis Mother   . Thyroid disease Mother   . Osteoporosis Mother   . Stroke Maternal Grandfather   . Bipolar disorder Brother   . Gout Brother   . Breast cancer Other        MGM's sister; dx <50  . Breast cancer Other 37       MGF's sister  . Brain cancer Other        MGM's sister; dx <50  . Leukemia Other        mother's maternal cousin; dx under 10    ROS:  Pertinent items are noted in HPI.  Otherwise, a comprehensive ROS was negative.  Exam:   BP (!) 92/52 (BP Location: Left Arm, Patient Position: Sitting, Cuff Size: Normal)   Pulse 62   Resp 14   Ht 5' 4.5" (1.638 m)   Wt 126 lb (57.2 kg)   BMI 21.29 kg/m   Weight change: -2#  Height: 5' 4.5" (163.8 cm)  Ht Readings from Last 3 Encounters:  04/03/17 5' 4.5" (1.638 m)  03/09/17 5\' 4"  (1.626 m)  12/11/16 5\' 4"  (1.626 m)    General appearance: alert, cooperative and appears stated age Head: Normocephalic, without obvious abnormality, atraumatic Neck: no adenopathy, supple, symmetrical, trachea midline and thyroid normal to inspection and palpation Lungs: clear to auscultation bilaterally Breasts: bilateral implants and reconstruction, tatooed nipples, no LAD Heart: regular rate and rhythm Abdomen: soft, non-tender; bowel sounds normal; no masses,  no organomegaly Extremities: extremities normal, atraumatic, no cyanosis or edema Skin: Skin color, texture, turgor  normal. Erythema, raised rash inner thighs Lymph nodes: Cervical, supraclavicular, and axillary nodes normal. No abnormal inguinal nodes palpated Neurologic: Grossly normal   Pelvic: External genitalia:  no lesions              Urethra:  normal appearing urethra with no masses, tenderness or lesions              Bartholins and Skenes: normal                 Vagina: normal appearing vagina with normal color and discharge, no lesions  Cervix: normal appearance              Pap taken: Yes.   Bimanual Exam:  Uterus:  normal size, contour, position, consistency, mobility, non-tender              Adnexa: normal adnexa and no mass, fullness, tenderness               Rectovaginal: Confirms               Anus:  normal sphincter tone, no lesions  Chaperone was present for exam.  A:  Well Woman with normal exam PMP, no HRT H/O ER+ breast cancer s/p right mastectomy with 3 positive lymph nodes.  Chemo/radiation 11/13.  S/p simple left simple mastectomy 3/14.  Genetic testing was negative. Family hx of breast cancer with mother who is now deceased. OSA Anxiety/depresion H/O abnormal pap smear with conization 1998 Skin rash Urine odor  P:   Mammogram not indicated pap smear and HR HPV obtained today No lab work obtained today Order for BMD completed today.  Will schedule in January for pt today. rx for triamcinolone 0.1% cream bid for up to a week.  #30/0RF. Urine culture pending Return annually or prn

## 2017-04-03 ENCOUNTER — Telehealth: Payer: Self-pay | Admitting: *Deleted

## 2017-04-03 ENCOUNTER — Encounter: Payer: Self-pay | Admitting: Obstetrics & Gynecology

## 2017-04-03 ENCOUNTER — Ambulatory Visit (INDEPENDENT_AMBULATORY_CARE_PROVIDER_SITE_OTHER): Payer: 59 | Admitting: Obstetrics & Gynecology

## 2017-04-03 ENCOUNTER — Other Ambulatory Visit (HOSPITAL_COMMUNITY)
Admission: RE | Admit: 2017-04-03 | Discharge: 2017-04-03 | Disposition: A | Discharge status: Home / Self Care | Payer: 59 | Source: Ambulatory Visit | Attending: Obstetrics & Gynecology | Admitting: Obstetrics & Gynecology

## 2017-04-03 VITALS — BP 92/52 | HR 62 | Resp 14 | Ht 64.5 in | Wt 126.0 lb

## 2017-04-03 DIAGNOSIS — Z124 Encounter for screening for malignant neoplasm of cervix: Secondary | ICD-10-CM

## 2017-04-03 DIAGNOSIS — R8290 Unspecified abnormal findings in urine: Secondary | ICD-10-CM | POA: Diagnosis not present

## 2017-04-03 DIAGNOSIS — N3090 Cystitis, unspecified without hematuria: Secondary | ICD-10-CM

## 2017-04-03 DIAGNOSIS — Z0000 Encounter for general adult medical examination without abnormal findings: Secondary | ICD-10-CM

## 2017-04-03 DIAGNOSIS — Z01419 Encounter for gynecological examination (general) (routine) without abnormal findings: Secondary | ICD-10-CM | POA: Diagnosis not present

## 2017-04-03 LAB — POCT URINALYSIS DIPSTICK
Bilirubin, UA: NEGATIVE
Blood, UA: NEGATIVE
Glucose, UA: NEGATIVE
Ketones, UA: NEGATIVE
Nitrite, UA: NEGATIVE
Protein, UA: NEGATIVE
Urobilinogen, UA: 0.2 E.U./dL
pH, UA: 5 (ref 5.0–8.0)

## 2017-04-03 MED ORDER — TRIAMCINOLONE ACETONIDE 0.1 % EX CREA: 1.0000 "application " | TOPICAL_CREAM | Freq: Two times a day (BID) | CUTANEOUS | 0 refills | Status: DC

## 2017-04-03 NOTE — Telephone Encounter (Signed)
Detailed message left per DPR with date and appointment time of BMD scan. Appointment scheduled at [facility] on Friday 07/31/17 at 1330. Advised patient if she needed to reschedule she could return call to office and RN would reschedule appointment or she could call [facility] directly to reschedule. [facility] telephone number provided for patient.   Patient agreeable to disposition. Will close encounter.

## 2017-04-03 NOTE — Telephone Encounter (Signed)
Call to [facility]. Spoke with Janace Hoard, and patient is scheduled for BMD on Friday 07/31/17 at 1330. RN advised will fax order to [facility].

## 2017-04-03 NOTE — Addendum Note (Signed)
Addended by: Karmen Bongo T on: 04/03/2017 11:04 AM   Modules accepted: Orders

## 2017-04-05 LAB — URINE CULTURE

## 2017-04-06 DIAGNOSIS — R293 Abnormal posture: Secondary | ICD-10-CM | POA: Diagnosis not present

## 2017-04-06 DIAGNOSIS — M545 Low back pain: Secondary | ICD-10-CM | POA: Diagnosis not present

## 2017-04-06 DIAGNOSIS — M2560 Stiffness of unspecified joint, not elsewhere classified: Secondary | ICD-10-CM | POA: Diagnosis not present

## 2017-04-07 LAB — CYTOLOGY - PAP
Diagnosis: NEGATIVE
HPV: NOT DETECTED

## 2017-04-08 NOTE — Addendum Note (Signed)
Addended by: Megan Salon on: 04/08/2017 03:46 PM   Modules accepted: Orders

## 2017-04-09 ENCOUNTER — Telehealth: Payer: Self-pay

## 2017-04-09 MED ORDER — CEPHALEXIN 500 MG PO CAPS: 500.0000 mg | ORAL_CAPSULE | Freq: Four times a day (QID) | ORAL | 0 refills | Status: DC

## 2017-04-09 NOTE — Telephone Encounter (Signed)
Spoke with patient and notified of pap results and positive GBS on urine culture. Made TOC appointment for 04-20-17 at 8:30am. Sent Rx to pharmacy for Keflex 500mg  #20, 1 po qid, NR.

## 2017-04-09 NOTE — Telephone Encounter (Signed)
Called patient to discuss pap and urine culture results, LMOVM to call me.

## 2017-04-09 NOTE — Telephone Encounter (Signed)
-----   Message from Megan Salon, MD sent at 04/08/2017  3:45 PM EDT ----- Please let pt know her pap smear was negative with negative HR HPV.  Her urine culture showed GBS.  Should treat with Keflex 500mg  qid x 5 days and then repeat urine culture in 7-10 days.  Order for urine culture has been placed.

## 2017-04-10 DIAGNOSIS — R293 Abnormal posture: Secondary | ICD-10-CM | POA: Diagnosis not present

## 2017-04-10 DIAGNOSIS — M545 Low back pain: Secondary | ICD-10-CM | POA: Diagnosis not present

## 2017-04-10 DIAGNOSIS — M2560 Stiffness of unspecified joint, not elsewhere classified: Secondary | ICD-10-CM | POA: Diagnosis not present

## 2017-04-17 DIAGNOSIS — R293 Abnormal posture: Secondary | ICD-10-CM | POA: Diagnosis not present

## 2017-04-17 DIAGNOSIS — M545 Low back pain: Secondary | ICD-10-CM | POA: Diagnosis not present

## 2017-04-17 DIAGNOSIS — M2560 Stiffness of unspecified joint, not elsewhere classified: Secondary | ICD-10-CM | POA: Diagnosis not present

## 2017-04-20 ENCOUNTER — Telehealth: Payer: Self-pay | Admitting: Obstetrics & Gynecology

## 2017-04-20 ENCOUNTER — Ambulatory Visit (INDEPENDENT_AMBULATORY_CARE_PROVIDER_SITE_OTHER): Payer: 59

## 2017-04-20 ENCOUNTER — Telehealth: Payer: Self-pay

## 2017-04-20 DIAGNOSIS — N3090 Cystitis, unspecified without hematuria: Secondary | ICD-10-CM | POA: Diagnosis not present

## 2017-04-20 NOTE — Telephone Encounter (Signed)
Patient called and left a message at lunch wanting to know why she has an appointment at the [facility] on Wednesday, 04/22/17. She said she doesn't know why this was scheduled and she doesn't know about it. She thinks it was scheduled by our office.

## 2017-04-20 NOTE — Telephone Encounter (Signed)
Left VM for patient concerning S[facility] visit on 04/22/17.  Asked her to call center if she has any questions or would like to know more about visit.

## 2017-04-20 NOTE — Telephone Encounter (Signed)
Call to patient. Advised appointment on 04-22-17 was not made by our office. This appointment is a survivorship meeting  That is generally scheduled by the cancer center. Patient states she is not aware of it and requests I call to check on it.  Call to [facility] [place], appointment was scheduled for patient by their office and someone will contact patient to review it with her.   Patient updated.  Routing to provider for final review. Patient agreeable to disposition. Will close encounter.

## 2017-04-20 NOTE — Progress Notes (Signed)
Patient in office for a TOC urine culture. Patient states that she has completed the course of Keflex antibiotic. Per patient unable to tell if urinary frequency has improved. Urine obtained and given to the lab to be resulted. Routing to provider for final review. Patient agreeable to disposition. Will close encounter.

## 2017-04-21 ENCOUNTER — Telehealth: Payer: Self-pay | Admitting: Obstetrics & Gynecology

## 2017-04-21 NOTE — Telephone Encounter (Signed)
Spoke with patient, advised results for urine culture dated 04/20/17 not back yet. Once results received and reviewed by Dr. Sabra Heck, will return call to review. Patient verbalizes understanding and is agreeable.  Routing to provider for final review. Patient is agreeable to disposition. Will close encounter.

## 2017-04-21 NOTE — Telephone Encounter (Signed)
Patient checking to see if urine results are in.

## 2017-04-22 ENCOUNTER — Telehealth: Payer: Self-pay

## 2017-04-22 ENCOUNTER — Encounter: Payer: Self-pay | Admitting: Adult Health

## 2017-04-22 ENCOUNTER — Other Ambulatory Visit: Payer: Self-pay | Admitting: Obstetrics and Gynecology

## 2017-04-22 ENCOUNTER — Telehealth: Payer: Self-pay | Admitting: Obstetrics & Gynecology

## 2017-04-22 LAB — URINE CULTURE

## 2017-04-22 MED ORDER — AMPICILLIN 500 MG PO CAPS: 500.0000 mg | ORAL_CAPSULE | Freq: Three times a day (TID) | ORAL | 0 refills | Status: DC

## 2017-04-22 NOTE — Telephone Encounter (Signed)
Spoke with patient. Advised urine culture has returned positive for enterococcus faecalis. Will have covering MD review results and return call with recommendations.  Dr.Silva, okay to start patient on Macrobid? Patient did have urine culture on 04/03/2017 that was positive for beta hemolytic streptococcus.

## 2017-04-22 NOTE — Telephone Encounter (Signed)
Patient checking on test results

## 2017-04-22 NOTE — Telephone Encounter (Signed)
Patient left VM asking for reschedule of appt on 9/26. Sent note to scheduling to call patient.

## 2017-04-22 NOTE — Telephone Encounter (Signed)
I just sent through an Rx for Ampicillin 500 mg po tid for 7 days to her pharmacy of record.  No TOC needed.

## 2017-04-22 NOTE — Telephone Encounter (Signed)
Spoke with patient. Advised of message as seen below from [place]. Patient verbalizes understanding. Patient requests urine TOC. Advised TOC is not needed. Retesting is only needed if symptoms return. Patient states she would like to return for a TOC. "I want to know this is clear. Otherwise I do not know how we will know." TOC scheduled for 05/06/2017 at 2 pm. Patient is agreeable to date and time.  Routing to covering provider for final review. Patient agreeable to disposition. Will close encounter.

## 2017-04-23 DIAGNOSIS — R293 Abnormal posture: Secondary | ICD-10-CM | POA: Diagnosis not present

## 2017-04-23 DIAGNOSIS — M2560 Stiffness of unspecified joint, not elsewhere classified: Secondary | ICD-10-CM | POA: Diagnosis not present

## 2017-04-23 DIAGNOSIS — M545 Low back pain: Secondary | ICD-10-CM | POA: Diagnosis not present

## 2017-05-06 ENCOUNTER — Telehealth: Payer: Self-pay | Admitting: Obstetrics & Gynecology

## 2017-05-06 ENCOUNTER — Ambulatory Visit: Payer: 59

## 2017-05-06 NOTE — Telephone Encounter (Signed)
Thank you for the message.  OK to close encounter.

## 2017-05-06 NOTE — Telephone Encounter (Signed)
Patient called states she does not think she needs a urine culture

## 2017-05-06 NOTE — Telephone Encounter (Signed)
Call to patient for missed appointment. Unable to leave a message mailbox is full.

## 2017-05-12 DIAGNOSIS — Z62820 Parent-biological child conflict: Secondary | ICD-10-CM | POA: Diagnosis not present

## 2017-05-28 DIAGNOSIS — Z62820 Parent-biological child conflict: Secondary | ICD-10-CM | POA: Diagnosis not present

## 2017-10-13 DIAGNOSIS — Z853 Personal history of malignant neoplasm of breast: Secondary | ICD-10-CM | POA: Diagnosis not present

## 2017-10-13 DIAGNOSIS — R109 Unspecified abdominal pain: Secondary | ICD-10-CM | POA: Diagnosis not present

## 2017-10-13 DIAGNOSIS — K921 Melena: Secondary | ICD-10-CM | POA: Diagnosis not present

## 2017-10-13 DIAGNOSIS — R195 Other fecal abnormalities: Secondary | ICD-10-CM | POA: Diagnosis not present

## 2017-10-15 ENCOUNTER — Other Ambulatory Visit: Payer: Self-pay | Admitting: Family Medicine

## 2017-10-15 ENCOUNTER — Ambulatory Visit
Admission: RE | Admit: 2017-10-15 | Discharge: 2017-10-15 | Disposition: A | Discharge status: Home / Self Care | Payer: 59 | Source: Ambulatory Visit | Attending: Family Medicine | Admitting: Family Medicine

## 2017-10-15 DIAGNOSIS — R748 Abnormal levels of other serum enzymes: Secondary | ICD-10-CM

## 2017-10-15 DIAGNOSIS — K760 Fatty (change of) liver, not elsewhere classified: Secondary | ICD-10-CM | POA: Diagnosis not present

## 2017-10-15 MED ORDER — IOPAMIDOL (ISOVUE-300) INJECTION 61%
100.0000 mL | Freq: Once | INTRAVENOUS | Status: AC | PRN
  Administered 2017-10-15: 100 mL via INTRAVENOUS

## 2017-10-20 ENCOUNTER — Other Ambulatory Visit: Payer: Self-pay | Admitting: Gastroenterology

## 2017-10-20 DIAGNOSIS — Z7689 Persons encountering health services in other specified circumstances: Secondary | ICD-10-CM | POA: Diagnosis not present

## 2017-10-20 DIAGNOSIS — R112 Nausea with vomiting, unspecified: Secondary | ICD-10-CM

## 2017-10-20 DIAGNOSIS — K219 Gastro-esophageal reflux disease without esophagitis: Secondary | ICD-10-CM | POA: Diagnosis not present

## 2017-10-20 DIAGNOSIS — K625 Hemorrhage of anus and rectum: Secondary | ICD-10-CM | POA: Diagnosis not present

## 2017-10-20 DIAGNOSIS — R1310 Dysphagia, unspecified: Secondary | ICD-10-CM | POA: Diagnosis not present

## 2017-10-28 ENCOUNTER — Encounter: Payer: Self-pay | Admitting: Obstetrics & Gynecology

## 2017-10-28 DIAGNOSIS — K625 Hemorrhage of anus and rectum: Secondary | ICD-10-CM | POA: Diagnosis not present

## 2017-10-28 DIAGNOSIS — K219 Gastro-esophageal reflux disease without esophagitis: Secondary | ICD-10-CM | POA: Diagnosis not present

## 2017-10-28 DIAGNOSIS — R1310 Dysphagia, unspecified: Secondary | ICD-10-CM | POA: Diagnosis not present

## 2017-10-28 DIAGNOSIS — R194 Change in bowel habit: Secondary | ICD-10-CM | POA: Diagnosis not present

## 2017-10-28 DIAGNOSIS — K921 Melena: Secondary | ICD-10-CM | POA: Diagnosis not present

## 2017-10-28 DIAGNOSIS — Z1211 Encounter for screening for malignant neoplasm of colon: Secondary | ICD-10-CM | POA: Diagnosis not present

## 2017-10-28 DIAGNOSIS — D122 Benign neoplasm of ascending colon: Secondary | ICD-10-CM | POA: Diagnosis not present

## 2017-10-28 DIAGNOSIS — K635 Polyp of colon: Secondary | ICD-10-CM | POA: Diagnosis not present

## 2017-10-29 ENCOUNTER — Encounter (HOSPITAL_COMMUNITY)
Admission: RE | Admit: 2017-10-29 | Discharge: 2017-10-29 | Disposition: A | Discharge status: Home / Self Care | Payer: 59 | Source: Ambulatory Visit | Attending: Gastroenterology | Admitting: Gastroenterology

## 2017-10-29 ENCOUNTER — Ambulatory Visit (HOSPITAL_COMMUNITY)
Admission: RE | Admit: 2017-10-29 | Discharge: 2017-10-29 | Disposition: A | Discharge status: Home / Self Care | Payer: 59 | Source: Ambulatory Visit | Attending: Gastroenterology | Admitting: Gastroenterology

## 2017-10-29 DIAGNOSIS — R112 Nausea with vomiting, unspecified: Secondary | ICD-10-CM

## 2017-10-29 DIAGNOSIS — R1011 Right upper quadrant pain: Secondary | ICD-10-CM | POA: Diagnosis not present

## 2017-10-29 DIAGNOSIS — R110 Nausea: Secondary | ICD-10-CM | POA: Diagnosis not present

## 2017-10-29 MED ORDER — TECHNETIUM TC 99M MEBROFENIN IV KIT
5.1800 | PACK | Freq: Once | INTRAVENOUS | Status: AC | PRN
  Administered 2017-10-29: 5.18 via INTRAVENOUS

## 2017-10-30 ENCOUNTER — Ambulatory Visit: Payer: 59 | Admitting: Obstetrics & Gynecology

## 2017-10-30 ENCOUNTER — Other Ambulatory Visit: Payer: Self-pay

## 2017-10-30 ENCOUNTER — Telehealth: Payer: Self-pay | Admitting: Obstetrics & Gynecology

## 2017-10-30 ENCOUNTER — Telehealth: Payer: Self-pay

## 2017-10-30 ENCOUNTER — Other Ambulatory Visit: Payer: Self-pay | Admitting: Obstetrics & Gynecology

## 2017-10-30 ENCOUNTER — Encounter: Payer: Self-pay | Admitting: Obstetrics & Gynecology

## 2017-10-30 VITALS — BP 112/76 | HR 88 | Resp 14 | Ht 64.5 in | Wt 129.0 lb

## 2017-10-30 DIAGNOSIS — Z853 Personal history of malignant neoplasm of breast: Secondary | ICD-10-CM

## 2017-10-30 DIAGNOSIS — R2231 Localized swelling, mass and lump, right upper limb: Secondary | ICD-10-CM | POA: Diagnosis not present

## 2017-10-30 DIAGNOSIS — N6310 Unspecified lump in the right breast, unspecified quadrant: Secondary | ICD-10-CM

## 2017-10-30 NOTE — Progress Notes (Addendum)
GYNECOLOGY  VISIT  CC:   Axillary mass  HPI: 57 y.o. [id number] Married Caucasian female here for breast mass that she noticed today.  It is not tender.  Having some shoulder issues.  Wonders if related.  H/o breast cancer on the right.  S/p mastectomy with TRAM flap.  Denies axillary pain.  Having a lot of GI issues.  Has just had colonoscopy, endoscopy, HIDA scan and RUQ ultrasound.  Reports polyp was found.  Waiting on results.    Had CBC done with Dr. Orland Mustard 10/13/17.    GYNECOLOGIC HISTORY: Patient's last menstrual period was 12/29/2013. Contraception: Tubal Ligation  Menopausal hormone therapy: none  Patient Active Problem List   Diagnosis Date Noted  . Malignant neoplasm of overlapping sites of right breast in female, estrogen receptor positive ([place]) 03/09/2017  . Breast replaced by other means 11/15/2013  . Status post bilateral breast implants 11/15/2013  . Status post bilateral breast reconstruction 11/15/2013  . Low back pain 05/31/2013  . Right breast cancer, IDC, multicentric 05/05/2012    Past Medical History:  Diagnosis Date  . Abnormal Pap smear of cervix    with conization  . Anxiety   . Arthritis   . Breast cancer ([place]) 05/05/12   right, ER +, PR -, Her 2 -  . Depression   . History of radiation therapy 07/14/2012-09/03/12   right chest wall 59.4 gray  . Scoliosis    Followed by Dr. Harlow Asa   . Sleep apnea    cpap-has not been using-doing well without it    Past Surgical History:  Procedure Laterality Date  . BREAST RECONSTRUCTION WITH PLACEMENT OF TISSUE EXPANDER AND FLEX HD (ACELLULAR HYDRATED DERMIS) Left 06/06/2013   Procedure: PLACEMENT OF LEFT BREAST TISSUE EXPANDER AND FLEX HD ;  Surgeon: Theodoro Kos, DO;  Location: [facility];  Service: Plastics;  Laterality: Left;  . BREAST SURGERY     several biopsies, needle aspiration  . CERVICAL CONIZATION W/BX    . CESAREAN SECTION     x3  . COSMETIC SURGERY  04/19/14   Dr. Migdalia Dk, mini tummy tuck, smoothed out  right breast, left breast lift  . DILATATION & CURRETTAGE/HYSTEROSCOPY WITH RESECTOCOPE N/A 06/12/2014   Procedure: DILATATION & CURETTAGE/HYSTEROSCOPY ;  Surgeon: Lyman Speller, MD;  Location: [facility];  Service: Gynecology;  Laterality: N/A;  . JOINT REPLACEMENT     Rt and Lt thumbs  . LATISSIMUS FLAP TO BREAST Right 06/06/2013   Procedure: RIGHT LATISSIMUS MYOCUTANEOUS FLAP WITH PLACEMENT OF TISSUE EXPANDER IN RIGHT BREAST ;  Surgeon: Theodoro Kos, DO;  Location: [facility];  Service: Plastics;  Laterality: Right;  . MASTECTOMY MODIFIED RADICAL  06/07/2012   Procedure: MASTECTOMY MODIFIED RADICAL;  Surgeon: Haywood Lasso, MD;  Location: [facility];  Service: General;  Laterality: Right;  . MASTECTOMY W/ SENTINEL NODE BIOPSY  06/07/2012   Procedure: MASTECTOMY WITH SENTINEL LYMPH NODE BIOPSY;  Surgeon: Haywood Lasso, MD;  Location: [facility];  Service: General;  Laterality: Right;  right total mastectomy and sentinel node  . REMOVAL OF BILATERAL TISSUE EXPANDERS WITH PLACEMENT OF BILATERAL BREAST IMPLANTS Bilateral 11/10/2013   Procedure: REMOVAL OF BILATERAL TISSUE EXPANDERS WITH PLACEMENT OF SILICONE BREAST IMPLANTS WITH BILATERAL CAPSULOTOMIES;  Surgeon: Theodoro Kos, DO;  Location: [facility];  Service: Plastics;  Laterality: Bilateral;  . SIMPLE MASTECTOMY WITH AXILLARY SENTINEL NODE BIOPSY Left 09/30/2012   Procedure: SIMPLE MASTECTOMY;  Surgeon: Haywood Lasso, MD;  Location: [facility];  Service: General;  Laterality: Left;  left total mastectomy  . TUBAL LIGATION      MEDS:   Current Outpatient Medications on File Prior to Visit  Medication Sig Dispense Refill  . ALPRAZolam (XANAX) 0.5 MG tablet Take 1 tablet by mouth daily as needed.  1  . amphetamine-dextroamphetamine (ADDERALL) 20 MG tablet Take 1 tablet by mouth 2 (two) times daily.  0  . buPROPion (WELLBUTRIN XL) 150 MG 24 hr tablet Take 1 tablet by mouth daily.    . CHLOROPHYLL PO Take by  mouth.    . Cholecalciferol (VITAMIN D PO) Take by mouth.    . Cyanocobalamin (VITAMIN B-12 PO) Take by mouth.    Marland Kitchen GINKGO BILOBA PO Take by mouth.    Marland Kitchen L-Methylfolate-Algae (DEPLIN 15) 15-90.314 MG CAPS Take 1 capsule by mouth daily.    . NON FORMULARY Juice Plus: Greens, reds, purples    . NON FORMULARY Juice plus omega 3    . NON FORMULARY Herbal Tea    . NON FORMULARY spirilina    . NONFORMULARY OR COMPOUNDED ITEM Vit E 200u/ml.  One suppository pv three time weekly. 36 each 3  . triamcinolone cream (KENALOG) 0.1 % Apply 1 application topically 2 (two) times daily. 30 g 0  . TURMERIC CURCUMIN PO Take by mouth daily.    Marland Kitchen VIIBRYD 40 MG TABS Take 1 tablet by mouth daily.     No current facility-administered medications on file prior to visit.     ALLERGIES: Doxycycline  Family History  Problem Relation Age of Onset  . Breast cancer Mother 56  . Lymphoma Mother 25       non-hodgkins lymphoma in her epiglottis  . Cancer Mother        non-hodkins lymphoma, breast  . Hypertension Mother   . Heart disease Mother   . Deep vein thrombosis Mother   . Thyroid disease Mother   . Osteoporosis Mother   . Stroke Maternal Grandfather   . Bipolar disorder Brother   . Gout Brother   . Breast cancer Other        MGM's sister; dx <50  . Breast cancer Other 96       MGF's sister  . Brain cancer Other        MGM's sister; dx <50  . Leukemia Other        mother's maternal cousin; dx under 65    SH:  Married, non smoker  Review of Systems  Gastrointestinal: Positive for abdominal pain, blood in stool, diarrhea, nausea and vomiting.  Genitourinary: Positive for frequency and urgency.       Breast mass  All other systems reviewed and are negative.   PHYSICAL EXAMINATION:    BP 112/76 (BP Location: Left Arm, Patient Position: Sitting)   Pulse 88   Resp 14   Ht 5' 4.5" (1.638 m)   Wt 129 lb (58.5 kg)   LMP 12/29/2013 Comment: spotting  BMI 21.80 kg/m     Physical Exam   Constitutional: She is oriented to person, place, and time. She appears well-developed and well-nourished.  Cardiovascular: Normal rate and regular rhythm.  Respiratory: Effort normal and breath sounds normal.    Neurological: She is alert and oriented to person, place, and time.  Skin: Skin is warm and dry.    Assessment: New axillary mass in pt with hx of breast cancer  Plan: Diagnostic ultrasound will be scheduled for pt CBC with diff.  Ca 27.29 Release of records  from Dr. Collene Mares signed today.

## 2017-10-30 NOTE — Addendum Note (Signed)
Addended by: Megan Salon on: 10/30/2017 03:22 PM   Modules accepted: Orders

## 2017-10-30 NOTE — Telephone Encounter (Signed)
Patient called complaining of Rt.Br.mass/underarm for 2-3 days. Very tender to touch, no redness, no fever but also hurts up into shoulder. She does have history of Rt.Br.Ca 2013. Made appointment to see Dr.Miller today at 2:15pm.

## 2017-10-30 NOTE — Progress Notes (Signed)
Right breast ultrasound scheduled for 11-02-17 at 1:40 pm at [facility]. Patient agreeable.

## 2017-10-30 NOTE — Telephone Encounter (Signed)
Opened in error

## 2017-10-31 LAB — CBC WITH DIFFERENTIAL/PLATELET
Basophils Absolute: 0 10*3/uL (ref 0.0–0.2)
Basos: 0 %
EOS (ABSOLUTE): 0.1 10*3/uL (ref 0.0–0.4)
Eos: 1 %
Hematocrit: 41.5 % (ref 34.0–46.6)
Hemoglobin: 13.8 g/dL (ref 11.1–15.9)
Immature Grans (Abs): 0 10*3/uL (ref 0.0–0.1)
Immature Granulocytes: 0 %
Lymphocytes Absolute: 1.2 10*3/uL (ref 0.7–3.1)
Lymphs: 26 %
MCH: 30.5 pg (ref 26.6–33.0)
MCHC: 33.3 g/dL (ref 31.5–35.7)
MCV: 92 fL (ref 79–97)
Monocytes Absolute: 0.3 10*3/uL (ref 0.1–0.9)
Monocytes: 7 %
Neutrophils Absolute: 3 10*3/uL (ref 1.4–7.0)
Neutrophils: 66 %
Platelets: 298 10*3/uL (ref 150–379)
RBC: 4.52 x10E6/uL (ref 3.77–5.28)
RDW: 13.1 % (ref 12.3–15.4)
WBC: 4.6 10*3/uL (ref 3.4–10.8)

## 2017-10-31 LAB — CANCER ANTIGEN 27.29: CA 27.29: 26.8 U/mL (ref 0.0–38.6)

## 2017-11-02 ENCOUNTER — Other Ambulatory Visit (HOSPITAL_COMMUNITY): Payer: 59

## 2017-11-02 ENCOUNTER — Encounter (HOSPITAL_COMMUNITY): Payer: 59

## 2017-11-02 ENCOUNTER — Ambulatory Visit
Admission: RE | Admit: 2017-11-02 | Discharge: 2017-11-02 | Disposition: A | Discharge status: Home / Self Care | Payer: 59 | Source: Ambulatory Visit | Attending: Obstetrics & Gynecology | Admitting: Obstetrics & Gynecology

## 2017-11-02 ENCOUNTER — Other Ambulatory Visit: Payer: 59

## 2017-11-02 ENCOUNTER — Telehealth: Payer: Self-pay | Admitting: *Deleted

## 2017-11-02 DIAGNOSIS — N6310 Unspecified lump in the right breast, unspecified quadrant: Secondary | ICD-10-CM

## 2017-11-02 DIAGNOSIS — R2231 Localized swelling, mass and lump, right upper limb: Secondary | ICD-10-CM

## 2017-11-02 DIAGNOSIS — Z853 Personal history of malignant neoplasm of breast: Secondary | ICD-10-CM

## 2017-11-02 DIAGNOSIS — N644 Mastodynia: Secondary | ICD-10-CM | POA: Diagnosis not present

## 2017-11-02 NOTE — Telephone Encounter (Signed)
Patient walked into office, requesting to review today's breast US completed at [facility]. Patient states she was advised additional imaging was recommended, has additional concerns about increased platelets and CA125 "trending up".   Advised patient report for breast imaging not completed, once received and reviewed by Dr. Sabra Heck will return call with recommendations. Patient request the above concerns be taken into consideration with recommendations.   Routing to Dr. Lestine Box.

## 2017-11-03 ENCOUNTER — Encounter: Payer: Self-pay | Admitting: Obstetrics & Gynecology

## 2017-11-03 NOTE — Telephone Encounter (Signed)
Dr. Sabra Heck, 11/03/17 breast US report available for review in Epic, please review and advise.

## 2017-11-03 NOTE — Telephone Encounter (Signed)
Please let pt know her ultrasound was negative but MRI was recommended if additional evaluation was felt appropriate.  I think we should proceed with MRI if she is agreeable.  May need a breast surgeon to see her first but will see if we can get this precerted.  Will cc to [facility] at well.  Pt with new axillary mass.  H/O breast cancer with bilateral mastectomy.  Ultrasound was negative.

## 2017-11-03 NOTE — Telephone Encounter (Signed)
Can you please call and see if we can get some preliminary report and when follow up is scheduled?

## 2017-11-03 NOTE — Telephone Encounter (Signed)
I will not be able to get a PET scan approved.  That is always done with oncology.  She is welcome to see oncology before proceeding with any testing if she desires.

## 2017-11-03 NOTE — Telephone Encounter (Signed)
Patient sent the following correspondence through [facility]. Routing to triage to assist patient with request.  ----- Message from [place], Generic sent at 11/03/2017 7:33 AM EDT -----    Good morning,   I had the ultrasound under my right armpit yesterday. They said they could not see behind the breast implant. Nor, did they aspirate the fluid that was collecting there.   I read my lab report and saw that the antigen test is going up as well as my platelets.   The breast center said that they would recommend a more in depth scan such as a PET scan.  Please let me know what follow up you are suggesting that I have.  Sincerely,   Kelly [name]

## 2017-11-03 NOTE — Telephone Encounter (Signed)
Spoke with patient, advised as seen below per Dr. Sabra Heck. Patient states she will proceed with MRI at this time. Advised will place order for MRI, our office will preauthorize with insurance and  [facility] will call you directly to schedule. Patient verbalizes understanding.   Order placed for MRI bilateral breast with and without contrast.   Routing to [place] D. Will close encounter.  Cc: Dr. Sabra Heck

## 2017-11-03 NOTE — Telephone Encounter (Signed)
Spoke with patient, advised as seen below per Dr. Sabra Heck. Patient requesting to proceed with PET-CT vs MRI, "thoughts from Dr. Sabra Heck?". Advised will review with Dr. Sabra Heck and return call.   Dr. Sabra Heck- please advise on PET-CT vs MRI?

## 2017-11-05 ENCOUNTER — Telehealth: Payer: Self-pay | Admitting: *Deleted

## 2017-11-05 NOTE — Telephone Encounter (Signed)
Call transferred from [place]. Patient states she was contacted by [facility] to schedule bilateral breast MRI, states this was not ordered correctly, requesting additional imaging be added. States she had a [place] of abdomen done on 10/29/17 and pancrease could not be completely visualized. Patient requesting this imaging be added d/t increase in lipase and "to get the whole picture". Advised patient she would need to f/u with Dr. Collene Mares for further evaluation of pancrease and additional  Imaging. Patient request this be reviewed with Dr. Sabra Heck.  Offered OV with Dr. Sabra Heck for further discussion, patient declined, states this was already discussed. Advised will return call once reviewed.  Dr. Sabra Heck -please review.

## 2017-11-05 NOTE — Telephone Encounter (Signed)
Spoke with patient, advised as seen below per Dr. Sabra Heck. Patient states she has placed a call to Dr. Collene Mares, is awaiting return call. Patient verbalizes understanding.  Patient is agreeable to disposition. Will close encounter.

## 2017-11-05 NOTE — Telephone Encounter (Signed)
Patient called to follow up on scheduling

## 2017-11-05 NOTE — Telephone Encounter (Signed)
MRI of breast and MRI of abdomen are two separate MRI tests.  This is not done like a CT scan where you can do CT of abdomen and CT of chest together.  They do one and then you stop and then you go back and do the second one.  I have ordered the MRI of the breast for her as this is the problem she came for.  MRI of abdomen will need to be ordered by Dr. Collene Mares as you informed her.  I'm sorry but this is not something I can just "add on".

## 2017-11-11 ENCOUNTER — Ambulatory Visit
Admission: RE | Admit: 2017-11-11 | Discharge: 2017-11-11 | Disposition: A | Discharge status: Home / Self Care | Payer: 59 | Source: Ambulatory Visit | Attending: Obstetrics & Gynecology | Admitting: Obstetrics & Gynecology

## 2017-11-11 DIAGNOSIS — Z853 Personal history of malignant neoplasm of breast: Secondary | ICD-10-CM

## 2017-11-11 DIAGNOSIS — R2231 Localized swelling, mass and lump, right upper limb: Secondary | ICD-10-CM

## 2017-11-11 MED ORDER — GADOBENATE DIMEGLUMINE 529 MG/ML IV SOLN
12.0000 mL | Freq: Once | INTRAVENOUS | Status: AC | PRN
  Administered 2017-11-11: 12 mL via INTRAVENOUS

## 2017-11-18 DIAGNOSIS — M79601 Pain in right arm: Secondary | ICD-10-CM | POA: Diagnosis not present

## 2017-11-18 DIAGNOSIS — R748 Abnormal levels of other serum enzymes: Secondary | ICD-10-CM | POA: Diagnosis not present

## 2017-11-18 DIAGNOSIS — Z853 Personal history of malignant neoplasm of breast: Secondary | ICD-10-CM | POA: Diagnosis not present

## 2017-11-19 ENCOUNTER — Ambulatory Visit
Admission: RE | Admit: 2017-11-19 | Discharge: 2017-11-19 | Disposition: A | Discharge status: Home / Self Care | Payer: 59 | Source: Ambulatory Visit | Attending: Family Medicine | Admitting: Family Medicine

## 2017-11-19 ENCOUNTER — Other Ambulatory Visit: Payer: Self-pay | Admitting: Family Medicine

## 2017-11-19 DIAGNOSIS — M79601 Pain in right arm: Secondary | ICD-10-CM

## 2017-11-19 DIAGNOSIS — M79621 Pain in right upper arm: Secondary | ICD-10-CM | POA: Diagnosis not present

## 2017-11-24 DIAGNOSIS — C50411 Malignant neoplasm of upper-outer quadrant of right female breast: Secondary | ICD-10-CM | POA: Diagnosis not present

## 2017-11-24 DIAGNOSIS — C50511 Malignant neoplasm of lower-outer quadrant of right female breast: Secondary | ICD-10-CM | POA: Diagnosis not present

## 2017-11-24 DIAGNOSIS — C50211 Malignant neoplasm of upper-inner quadrant of right female breast: Secondary | ICD-10-CM | POA: Diagnosis not present

## 2017-12-01 DIAGNOSIS — Z170 Estrogen receptor positive status [ER+]: Secondary | ICD-10-CM | POA: Diagnosis not present

## 2017-12-01 DIAGNOSIS — R197 Diarrhea, unspecified: Secondary | ICD-10-CM | POA: Diagnosis not present

## 2017-12-01 DIAGNOSIS — C50911 Malignant neoplasm of unspecified site of right female breast: Secondary | ICD-10-CM | POA: Diagnosis not present

## 2017-12-01 DIAGNOSIS — C50511 Malignant neoplasm of lower-outer quadrant of right female breast: Secondary | ICD-10-CM | POA: Diagnosis not present

## 2017-12-07 DIAGNOSIS — R110 Nausea: Secondary | ICD-10-CM | POA: Diagnosis not present

## 2017-12-07 DIAGNOSIS — C50911 Malignant neoplasm of unspecified site of right female breast: Secondary | ICD-10-CM | POA: Diagnosis not present

## 2017-12-07 DIAGNOSIS — R748 Abnormal levels of other serum enzymes: Secondary | ICD-10-CM | POA: Diagnosis not present

## 2017-12-07 DIAGNOSIS — J701 Chronic and other pulmonary manifestations due to radiation: Secondary | ICD-10-CM | POA: Diagnosis not present

## 2017-12-07 DIAGNOSIS — R1084 Generalized abdominal pain: Secondary | ICD-10-CM | POA: Diagnosis not present

## 2017-12-07 DIAGNOSIS — E041 Nontoxic single thyroid nodule: Secondary | ICD-10-CM | POA: Diagnosis not present

## 2017-12-09 DIAGNOSIS — R1900 Intra-abdominal and pelvic swelling, mass and lump, unspecified site: Secondary | ICD-10-CM | POA: Diagnosis not present

## 2017-12-09 DIAGNOSIS — Z08 Encounter for follow-up examination after completed treatment for malignant neoplasm: Secondary | ICD-10-CM | POA: Diagnosis not present

## 2017-12-09 DIAGNOSIS — N950 Postmenopausal bleeding: Secondary | ICD-10-CM | POA: Diagnosis not present

## 2017-12-09 DIAGNOSIS — C50911 Malignant neoplasm of unspecified site of right female breast: Secondary | ICD-10-CM | POA: Diagnosis not present

## 2017-12-09 DIAGNOSIS — R140 Abdominal distension (gaseous): Secondary | ICD-10-CM | POA: Diagnosis not present

## 2017-12-10 DIAGNOSIS — R1900 Intra-abdominal and pelvic swelling, mass and lump, unspecified site: Secondary | ICD-10-CM | POA: Diagnosis not present

## 2017-12-15 DIAGNOSIS — C50911 Malignant neoplasm of unspecified site of right female breast: Secondary | ICD-10-CM | POA: Diagnosis not present

## 2017-12-15 DIAGNOSIS — Z9889 Other specified postprocedural states: Secondary | ICD-10-CM | POA: Diagnosis not present

## 2017-12-23 ENCOUNTER — Encounter: Payer: Self-pay | Admitting: Physical Therapy

## 2017-12-23 ENCOUNTER — Other Ambulatory Visit: Payer: Self-pay

## 2017-12-23 ENCOUNTER — Ambulatory Visit: Payer: 59 | Attending: Plastic Surgery | Admitting: Physical Therapy

## 2017-12-23 DIAGNOSIS — M25511 Pain in right shoulder: Secondary | ICD-10-CM | POA: Insufficient documentation

## 2017-12-23 DIAGNOSIS — M6281 Muscle weakness (generalized): Secondary | ICD-10-CM | POA: Diagnosis present

## 2017-12-23 DIAGNOSIS — R600 Localized edema: Secondary | ICD-10-CM | POA: Diagnosis not present

## 2017-12-23 DIAGNOSIS — M25611 Stiffness of right shoulder, not elsewhere classified: Secondary | ICD-10-CM | POA: Insufficient documentation

## 2017-12-23 NOTE — Therapy (Signed)
[place], [place], [address] Phone: [phone]   Fax:  [phone]  Physical Therapy Evaluation  Patient Details  Name: Kelly [name] MRN: [id number] Date of Birth: Jun 18, 1961 Referring Provider: Marla Roe   Encounter Date: 12/23/2017  PT End of Session - 12/23/17 1627    Visit Number  1    Number of Visits  9    Date for PT Re-Evaluation  01/20/18    PT Start Time  1401 pt arrived late    PT Stop Time  1435    PT Time Calculation (min)  34 min    Activity Tolerance  Patient tolerated treatment well    Behavior During Therapy  [facility] for tasks assessed/performed       Past Medical History:  Diagnosis Date  . Abnormal Pap smear of cervix    with conization  . Anxiety   . Arthritis   . Breast cancer ([place]) 05/05/12   right, ER +, PR -, Her 2 -  . Depression   . History of radiation therapy 07/14/2012-09/03/12   right chest wall 59.4 gray  . Scoliosis    Followed by Dr. Harlow Asa   . Sleep apnea    cpap-has not been using-doing well without it    Past Surgical History:  Procedure Laterality Date  . BREAST RECONSTRUCTION WITH PLACEMENT OF TISSUE EXPANDER AND FLEX HD (ACELLULAR HYDRATED DERMIS) Left 06/06/2013   Procedure: PLACEMENT OF LEFT BREAST TISSUE EXPANDER AND FLEX HD ;  Surgeon: Theodoro Kos, DO;  Location: [place];  Service: Plastics;  Laterality: Left;  . BREAST SURGERY     several biopsies, needle aspiration  . CERVICAL CONIZATION W/BX    . CESAREAN SECTION     x3  . COSMETIC SURGERY  04/19/14   Dr. Migdalia Dk, mini tummy tuck, smoothed out right breast, left breast lift  . DILATATION & CURRETTAGE/HYSTEROSCOPY WITH RESECTOCOPE N/A 06/12/2014   Procedure: DILATATION & CURETTAGE/HYSTEROSCOPY ;  Surgeon: Lyman Speller, MD;  Location: [facility];  Service: Gynecology;  Laterality: N/A;  . JOINT REPLACEMENT     Rt and Lt thumbs  . LATISSIMUS FLAP TO BREAST Right 06/06/2013   Procedure:  RIGHT LATISSIMUS MYOCUTANEOUS FLAP WITH PLACEMENT OF TISSUE EXPANDER IN RIGHT BREAST ;  Surgeon: Theodoro Kos, DO;  Location: [place];  Service: Plastics;  Laterality: Right;  . MASTECTOMY MODIFIED RADICAL  06/07/2012   Procedure: MASTECTOMY MODIFIED RADICAL;  Surgeon: Haywood Lasso, MD;  Location: [place];  Service: General;  Laterality: Right;  . MASTECTOMY W/ SENTINEL NODE BIOPSY  06/07/2012   Procedure: MASTECTOMY WITH SENTINEL LYMPH NODE BIOPSY;  Surgeon: Haywood Lasso, MD;  Location: [place];  Service: General;  Laterality: Right;  right total mastectomy and sentinel node  . REMOVAL OF BILATERAL TISSUE EXPANDERS WITH PLACEMENT OF BILATERAL BREAST IMPLANTS Bilateral 11/10/2013   Procedure: REMOVAL OF BILATERAL TISSUE EXPANDERS WITH PLACEMENT OF SILICONE BREAST IMPLANTS WITH BILATERAL CAPSULOTOMIES;  Surgeon: Theodoro Kos, DO;  Location: [place];  Service: Plastics;  Laterality: Bilateral;  . SIMPLE MASTECTOMY WITH AXILLARY SENTINEL NODE BIOPSY Left 09/30/2012   Procedure: SIMPLE MASTECTOMY;  Surgeon: Haywood Lasso, MD;  Location: [place];  Service: General;  Laterality: Left;  left total mastectomy  . TUBAL LIGATION      There were no vitals filed for this visit.   Subjective Assessment - 12/23/17 1408    Subjective  I am having swelling in my abdomen, chest,  and RUE. I am having trouble swallowing- there is a sensation of pressure in the back of my throat down to my chest. It almost burns. It is super sensitve. They are doing all these tests on me. I go see the GI doctor tomorrow. I have been having bloating, nausea, and intermittent diarrhea.     Pertinent History  bilateral mastectomies for right brast cancer and radiation to the right in 2013, underwent bilateral delayed reconstruction with right lat flap with tissue expander placement and left tissue expander placement in Nov 2014, pt hen underewnt exchange surgery with placemtn of bilateral  silicone implants 01/21/02, dilated and fluid filled multiple small bowel loops in the pelvis    Patient Stated Goals  to get the swelling and pain down    Currently in Pain?  Yes    Pain Score  6     Pain Location  Arm    Pain Orientation  Right;Upper    Pain Descriptors / Indicators  Throbbing    Pain Type  Chronic pain    Pain Radiating Towards  across top of abdomen    Pain Onset  More than a month ago    Pain Frequency  Constant    Aggravating Factors   nothing    Pain Relieving Factors  nothing    Effect of Pain on Daily Activities  more tired         [facility] PT Assessment - 12/23/17 0001      Assessment   Medical Diagnosis  right breast cancer    Referring Provider  [place]    Onset Date/Surgical Date  10/26/12    Hand Dominance  Right    Prior Therapy  none      Precautions   Precautions  Other (comment)    Precaution Comments  lymphedema      Restrictions   Weight Bearing Restrictions  No      Balance Screen   Has the patient fallen in the past 6 months  No    Has the patient had a decrease in activity level because of a fear of falling?   No    Is the patient reluctant to leave their home because of a fear of falling?   No      Home Environment   Living Environment  Private residence    Living Arrangements  Spouse/significant other 3 yr old son    Available Help at Discharge  Family    Type of [place]      Prior Function   Level of Independence  Independent    Vocation  Part time employment    Armed forces technical officer, just trained as a Charity fundraiser    Leisure  has not recently due to not feeling well      Cognition   Overall Cognitive Status  Within Functional Limits for tasks assessed      Observation/Other Assessments   Observations  fullness in abdomen and chest       ROM / Strength   AROM / PROM / Strength  AROM      AROM   AROM Assessment Site  Shoulder    Right/Left Shoulder  Right;Left    Right Shoulder Flexion  115  Degrees    Right Shoulder ABduction  149 Degrees    Right Shoulder Internal Rotation  45 Degrees    Right Shoulder External Rotation  78 Degrees    Left Shoulder Flexion  173 Degrees  Left Shoulder ABduction  179 Degrees    Left Shoulder Internal Rotation  76 Degrees    Left Shoulder External Rotation  90 Degrees        LYMPHEDEMA/ONCOLOGY QUESTIONNAIRE - 12/23/17 1427      Type   Cancer Type  right sided breast cancer      Surgeries   Mastectomy Date  10/26/12    Saline Implant Reconstruction Date  11/10/13    Sentinel Lymph Node Biopsy Date  10/26/12    Number Lymph Nodes Removed  23 9 were positive      Date Lymphedema/Swelling Started   Date  07/28/17      Treatment   Active Chemotherapy Treatment  No    Past Chemotherapy Treatment  No    Active Radiation Treatment  No    Past Radiation Treatment  Yes    Current Hormone Treatment  No    Past Hormone Therapy  No      What other symptoms do you have   Are you Having Heaviness or Tightness  Yes    Are you having Pain  Yes    Are you having pitting edema  No    Is it Hard or Difficult finding clothes that fit  No    Do you have infections  No    Is there Decreased scar mobility  No      Lymphedema Assessments   Lymphedema Assessments  Upper extremities      Right Upper Extremity Lymphedema   15 cm Proximal to Olecranon Process  26.2 cm    Olecranon Process  22.9 cm    15 cm Proximal to Ulnar Styloid Process  22.5 cm    Just Proximal to Ulnar Styloid Process  14 cm    Across Hand at [employer]  18.5 cm    At [place] of 2nd Digit  6 cm      Left Upper Extremity Lymphedema   15 cm Proximal to Olecranon Process  27 cm    Olecranon Process  23.6 cm    15 cm Proximal to Ulnar Styloid Process  22.6 cm    Just Proximal to Ulnar Styloid Process  14.5 cm    Across Hand at [employer]  18.9 cm    At [place] of 2nd Digit  6 cm             Objective measurements completed on examination: See above  findings.                   PT Long Term Goals - 12/23/17 1637      PT LONG TERM GOAL #1   Title  Pt will demonstate 160 degrees of right shoulder flexion to allow her to reach in to the cabinets    Baseline  115    Time  4    Period  Weeks    Status  New    Target Date  01/20/18      PT LONG TERM GOAL #2   Title  Pt will demonstrate 160 degrees of right shoulder abduction to allow her to reach out to her sides    Baseline  149    Time  4    Period  Weeks    Status  New    Target Date  01/20/18      PT LONG TERM GOAL #3   Title  Pt will obtain a new compression sleeve for RUE for long term management  of swelling    Time  4    Period  Weeks    Status  New    Target Date  01/20/18      PT LONG TERM GOAL #4   Title  Pt will be independent in a home exercise program for continued strengthening and stretching    Time  4    Period  Weeks    Status  New    Target Date  01/20/18      PT LONG TERM GOAL #5   Title  Pt will report a 50% improvement in swelling in right chest to decrease risk of infection    Time  4    Period  Weeks    Status  New    Target Date  01/20/18             Plan - 12/23/17 1627    Clinical Impression Statement  Pt presents to PT for chest and abdominal tightness and reports swelling in her chest, RUE and abdomen that began recently. She has a history of right sided breast cancer and completed radiation and underwent bilateral mastectomy in 2014. She had silicone implants placed in 2015. Just this year pt reports sudden onset of RUE, chest and abdominal swelling. She also reports a strange sensation in the back of her throat which makes it feel hard for her to breathe. She has recently been having nausea and diarrhea. She reports having lots of tests done recently to find a cause for her symptoms. During a vaginal exam they found multiple fluid filled small bowel loops in the pelvis. She is going to see the GI doctor this week. She has  very limited R shoulder flexion ROM and would benefit from PT to increase this. She may also benefit from MLD to her right chest to decrease swelling. Her abdominal swelling is not consistent with typical presentation of lymphedema. She is currently wearing 2 compression sleeves on her RUE to keep her arm from swelling and today there is no signficiant difference in circumferences between R and L UEs. Pt would also benefit from a new compression sleeve since she has been wearing 2 because they are no longer as compressive as they used to be.     History and Personal Factors relevant to plan of care:  prior hx of breast cancer, fluid filled loops in bowel that were recently found, right handed    Clinical Presentation  Unstable    Clinical Presentation due to:  sudden onset of abdominal, chest and RUE swelling, difficulty breathing due to feeling of pressure in throat    Clinical Decision Making  High    Rehab Potential  Fair    Clinical Impairments Affecting Rehab Potential  unknown etiology of swelling, surgery was in 2015 and pt has been tight since that time    PT Frequency  2x / week    PT Duration  4 weeks    PT Treatment/Interventions  ADLs/Self Care Home Management;Therapeutic activities;Therapeutic exercise;Orthotic Fit/Training;Patient/family education;Manual techniques;Manual lymph drainage;Compression bandaging;Scar mobilization;Taping;Passive range of motion    PT Next Visit Plan  begin PROM to R shoulder to improve flexion, pulleys, ball up wall, MLD to chest only using only interaxillary pathway    Consulted and Agree with Plan of Care  Patient       Patient will benefit from skilled therapeutic intervention in order to improve the following deficits and impairments:  Increased edema, Decreased knowledge of precautions, Decreased range of motion, Decreased strength,  Pain, Increased fascial restricitons  Visit Diagnosis: Stiffness of right shoulder, not elsewhere classified  Acute  pain of right shoulder  Localized edema  Muscle weakness (generalized)     Problem List Patient Active Problem List   Diagnosis Date Noted  . Malignant neoplasm of overlapping sites of right breast in female, estrogen receptor positive ([place]) 03/09/2017  . Breast replaced by other means 11/15/2013  . Status post bilateral breast implants 11/15/2013  . Status post bilateral breast reconstruction 11/15/2013  . Low back pain 05/31/2013  . Right breast cancer, IDC, multicentric 05/05/2012    [place] 12/23/2017, 4:40 PM  [place], [place], [address] Phone: [phone]   Fax:  [phone]  Name: Kelly [name] MRN: [id number] Date of Birth: [birth year]  Manus Gunning, PT 12/23/17 4:40 PM

## 2017-12-24 DIAGNOSIS — R110 Nausea: Secondary | ICD-10-CM | POA: Diagnosis not present

## 2017-12-24 DIAGNOSIS — K625 Hemorrhage of anus and rectum: Secondary | ICD-10-CM | POA: Diagnosis not present

## 2017-12-24 DIAGNOSIS — C50911 Malignant neoplasm of unspecified site of right female breast: Secondary | ICD-10-CM | POA: Diagnosis not present

## 2017-12-24 DIAGNOSIS — R0789 Other chest pain: Secondary | ICD-10-CM | POA: Diagnosis not present

## 2017-12-24 DIAGNOSIS — K5900 Constipation, unspecified: Secondary | ICD-10-CM | POA: Diagnosis not present

## 2017-12-24 DIAGNOSIS — R109 Unspecified abdominal pain: Secondary | ICD-10-CM | POA: Diagnosis not present

## 2018-01-01 DIAGNOSIS — Z9013 Acquired absence of bilateral breasts and nipples: Secondary | ICD-10-CM | POA: Diagnosis not present

## 2018-01-01 DIAGNOSIS — R2231 Localized swelling, mass and lump, right upper limb: Secondary | ICD-10-CM | POA: Diagnosis not present

## 2018-01-01 DIAGNOSIS — R222 Localized swelling, mass and lump, trunk: Secondary | ICD-10-CM | POA: Diagnosis not present

## 2018-01-04 DIAGNOSIS — R109 Unspecified abdominal pain: Secondary | ICD-10-CM | POA: Diagnosis not present

## 2018-01-04 DIAGNOSIS — Z853 Personal history of malignant neoplasm of breast: Secondary | ICD-10-CM | POA: Diagnosis not present

## 2018-01-04 DIAGNOSIS — R0602 Shortness of breath: Secondary | ICD-10-CM | POA: Diagnosis not present

## 2018-01-04 DIAGNOSIS — R1310 Dysphagia, unspecified: Secondary | ICD-10-CM | POA: Diagnosis not present

## 2018-01-06 ENCOUNTER — Ambulatory Visit: Payer: 59

## 2018-01-06 DIAGNOSIS — C50911 Malignant neoplasm of unspecified site of right female breast: Secondary | ICD-10-CM | POA: Diagnosis not present

## 2018-01-06 DIAGNOSIS — R0609 Other forms of dyspnea: Secondary | ICD-10-CM | POA: Diagnosis not present

## 2018-01-08 ENCOUNTER — Ambulatory Visit: Payer: 59 | Attending: Plastic Surgery | Admitting: Physical Therapy

## 2018-01-13 ENCOUNTER — Ambulatory Visit: Payer: 59

## 2018-01-15 DIAGNOSIS — R0609 Other forms of dyspnea: Secondary | ICD-10-CM | POA: Diagnosis not present

## 2018-01-15 DIAGNOSIS — K625 Hemorrhage of anus and rectum: Secondary | ICD-10-CM | POA: Diagnosis not present

## 2018-01-15 DIAGNOSIS — C50911 Malignant neoplasm of unspecified site of right female breast: Secondary | ICD-10-CM | POA: Diagnosis not present

## 2018-01-19 DIAGNOSIS — C50911 Malignant neoplasm of unspecified site of right female breast: Secondary | ICD-10-CM | POA: Diagnosis not present

## 2018-01-25 ENCOUNTER — Ambulatory Visit: Payer: 59 | Admitting: Physical Therapy

## 2018-02-15 DIAGNOSIS — R5383 Other fatigue: Secondary | ICD-10-CM | POA: Diagnosis not present

## 2018-02-15 DIAGNOSIS — M2550 Pain in unspecified joint: Secondary | ICD-10-CM | POA: Diagnosis not present

## 2018-04-19 ENCOUNTER — Ambulatory Visit: Payer: Self-pay | Admitting: Obstetrics & Gynecology

## 2018-04-19 ENCOUNTER — Encounter: Payer: Self-pay | Admitting: Obstetrics & Gynecology

## 2018-04-19 NOTE — Progress Notes (Deleted)
57 y.o. [id number] Married White or Caucasian female here for annual exam.    Patient's last menstrual period was 12/29/2013.          Sexually active: {yes no:314532}  The current method of family planning is tubal ligation.    Exercising: {yes no:314532}  {types:19826} Smoker:  {YES [id number]  Health Maintenance: Pap:  04/03/17 Neg. HR HPV:neg   01/03/16 Neg  History of abnormal Pap:  Yes, 1998 MMG:  11/11/17 MRI Bilateral BIRADS2:Benign. Bilateral mastectomies Colonoscopy:  10/28/17 polyps.  BMD:   08/11/12 osteopenia  TDaP:  2015 Pneumonia vaccine(s):  *** Shingrix:   *** Hep C testing: 01/03/16 neg  Screening Labs: ***, Hb today: ***, Urine today: ***   reports that she has never smoked. She has never used smokeless tobacco. She reports that she drinks alcohol. She reports that she does not use drugs.  Past Medical History:  Diagnosis Date  . Abnormal Pap smear of cervix    with conization  . Anxiety   . Arthritis   . Breast cancer ([place]) 05/05/12   right, ER +, PR -, Her 2 -  . Depression   . History of radiation therapy 07/14/2012-09/03/12   right chest wall 59.4 gray  . Scoliosis    Followed by Dr. Harlow Asa   . Sleep apnea    cpap-has not been using-doing well without it    Past Surgical History:  Procedure Laterality Date  . BREAST RECONSTRUCTION WITH PLACEMENT OF TISSUE EXPANDER AND FLEX HD (ACELLULAR HYDRATED DERMIS) Left 06/06/2013   Procedure: PLACEMENT OF LEFT BREAST TISSUE EXPANDER AND FLEX HD ;  Surgeon: Theodoro Kos, DO;  Location: [place];  Service: Plastics;  Laterality: Left;  . BREAST SURGERY     several biopsies, needle aspiration  . CERVICAL CONIZATION W/BX    . CESAREAN SECTION     x3  . COSMETIC SURGERY  04/19/14   Dr. Migdalia Dk, mini tummy tuck, smoothed out right breast, left breast lift  . DILATATION & CURRETTAGE/HYSTEROSCOPY WITH RESECTOCOPE N/A 06/12/2014   Procedure: DILATATION & CURETTAGE/HYSTEROSCOPY ;  Surgeon: Lyman Speller, MD;  Location: [facility];   Service: Gynecology;  Laterality: N/A;  . JOINT REPLACEMENT     Rt and Lt thumbs  . LATISSIMUS FLAP TO BREAST Right 06/06/2013   Procedure: RIGHT LATISSIMUS MYOCUTANEOUS FLAP WITH PLACEMENT OF TISSUE EXPANDER IN RIGHT BREAST ;  Surgeon: Theodoro Kos, DO;  Location: [place];  Service: Plastics;  Laterality: Right;  . MASTECTOMY MODIFIED RADICAL  06/07/2012   Procedure: MASTECTOMY MODIFIED RADICAL;  Surgeon: Haywood Lasso, MD;  Location: [place];  Service: General;  Laterality: Right;  . MASTECTOMY W/ SENTINEL NODE BIOPSY  06/07/2012   Procedure: MASTECTOMY WITH SENTINEL LYMPH NODE BIOPSY;  Surgeon: Haywood Lasso, MD;  Location: [place];  Service: General;  Laterality: Right;  right total mastectomy and sentinel node  . REMOVAL OF BILATERAL TISSUE EXPANDERS WITH PLACEMENT OF BILATERAL BREAST IMPLANTS Bilateral 11/10/2013   Procedure: REMOVAL OF BILATERAL TISSUE EXPANDERS WITH PLACEMENT OF SILICONE BREAST IMPLANTS WITH BILATERAL CAPSULOTOMIES;  Surgeon: Theodoro Kos, DO;  Location: [place];  Service: Plastics;  Laterality: Bilateral;  . SIMPLE MASTECTOMY WITH AXILLARY SENTINEL NODE BIOPSY Left 09/30/2012   Procedure: SIMPLE MASTECTOMY;  Surgeon: Haywood Lasso, MD;  Location: [place];  Service: General;  Laterality: Left;  left total mastectomy  . TUBAL LIGATION      Current Outpatient Medications  Medication Sig Dispense Refill  . ALPRAZolam (  XANAX) 0.5 MG tablet Take 1 tablet by mouth daily as needed.  1  . amphetamine-dextroamphetamine (ADDERALL) 20 MG tablet Take 1 tablet by mouth 2 (two) times daily.  0  . buPROPion (WELLBUTRIN XL) 150 MG 24 hr tablet Take 1 tablet by mouth daily.    . CHLOROPHYLL PO Take by mouth.    . Cholecalciferol (VITAMIN D PO) Take by mouth.    . Cyanocobalamin (VITAMIN B-12 PO) Take by mouth.    Marland Kitchen GINKGO BILOBA PO Take by mouth.    Marland Kitchen L-Methylfolate-Algae (DEPLIN 15) 15-90.314 MG CAPS Take 1 capsule by mouth daily.    .  NON FORMULARY Juice Plus: Greens, reds, purples    . NON FORMULARY Juice plus omega 3    . NON FORMULARY Herbal Tea    . NON FORMULARY spirilina    . NONFORMULARY OR COMPOUNDED ITEM Vit E 200u/ml.  One suppository pv three time weekly. 36 each 3  . triamcinolone cream (KENALOG) 0.1 % Apply 1 application topically 2 (two) times daily. 30 g 0  . TURMERIC CURCUMIN PO Take by mouth daily.    Marland Kitchen VIIBRYD 40 MG TABS Take 1 tablet by mouth daily.     No current facility-administered medications for this visit.     Family History  Problem Relation Age of Onset  . Breast cancer Mother 53  . Lymphoma Mother 22       non-hodgkins lymphoma in her epiglottis  . Cancer Mother        non-hodkins lymphoma, breast  . Hypertension Mother   . Heart disease Mother   . Deep vein thrombosis Mother   . Thyroid disease Mother   . Osteoporosis Mother   . Stroke Maternal Grandfather   . Bipolar disorder Brother   . Gout Brother   . Breast cancer Other        MGM's sister; dx <50  . Breast cancer Other 56       MGF's sister  . Brain cancer Other        MGM's sister; dx <50  . Leukemia Other        mother's maternal cousin; dx under 10    Review of Systems  Exam:   LMP 12/29/2013 Comment: spotting  Height:      Ht Readings from Last 3 Encounters:  10/30/17 5' 4.5" (1.638 m)  04/03/17 5' 4.5" (1.638 m)  03/09/17 5\' 4"  (1.626 m)    General appearance: alert, cooperative and appears stated age Head: Normocephalic, without obvious abnormality, atraumatic Neck: no adenopathy, supple, symmetrical, trachea midline and thyroid {EXAM; THYROID:18604} Lungs: clear to auscultation bilaterally Breasts: {Exam; breast:13139::"normal appearance, no masses or tenderness"} Heart: regular rate and rhythm Abdomen: soft, non-tender; bowel sounds normal; no masses,  no organomegaly Extremities: extremities normal, atraumatic, no cyanosis or edema Skin: Skin color, texture, turgor normal. No rashes or  lesions Lymph nodes: Cervical, supraclavicular, and axillary nodes normal. No abnormal inguinal nodes palpated Neurologic: Grossly normal   Pelvic: External genitalia:  no lesions              Urethra:  normal appearing urethra with no masses, tenderness or lesions              Bartholins and Skenes: normal                 Vagina: normal appearing vagina with normal color and discharge, no lesions              Cervix: {exam;  cervix:14595}              Pap taken: {yes no:314532} Bimanual Exam:  Uterus:  {exam; uterus:12215}              Adnexa: {exam; adnexa:12223}               Rectovaginal: Confirms               Anus:  normal sphincter tone, no lesions  Chaperone was present for exam.  A:  Well Woman with normal exam  P:   {plan; gyn:5269::"mammogram","pap smear","return annually or prn"}

## 2018-06-08 DIAGNOSIS — Z853 Personal history of malignant neoplasm of breast: Secondary | ICD-10-CM | POA: Diagnosis not present

## 2018-06-08 DIAGNOSIS — R600 Localized edema: Secondary | ICD-10-CM | POA: Diagnosis not present

## 2018-06-08 DIAGNOSIS — R609 Edema, unspecified: Secondary | ICD-10-CM | POA: Diagnosis not present

## 2018-07-12 IMAGING — CR DG HUMERUS 2V *R*
2 series · 2 of 2 positions shown · non-contrast
Comparison: None.

CLINICAL DATA: Pain for 2 months.  History of breast carcinoma

EXAM:
RIGHT HUMERUS - 2+ VIEW

[w humerus ap right]
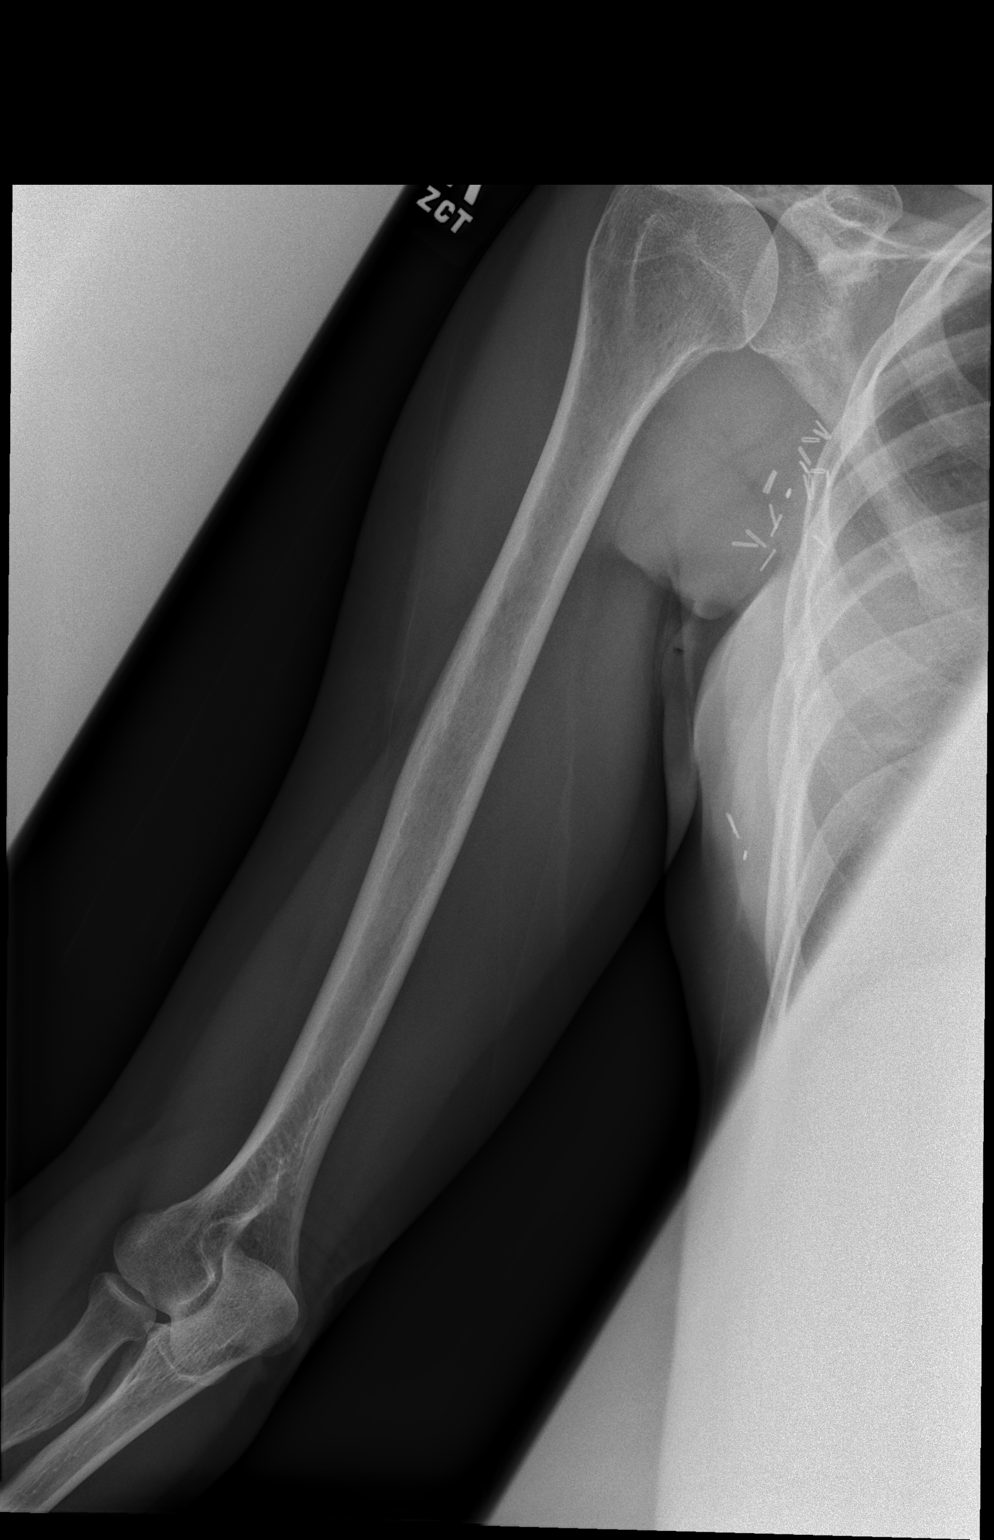

[w humerus lat right]
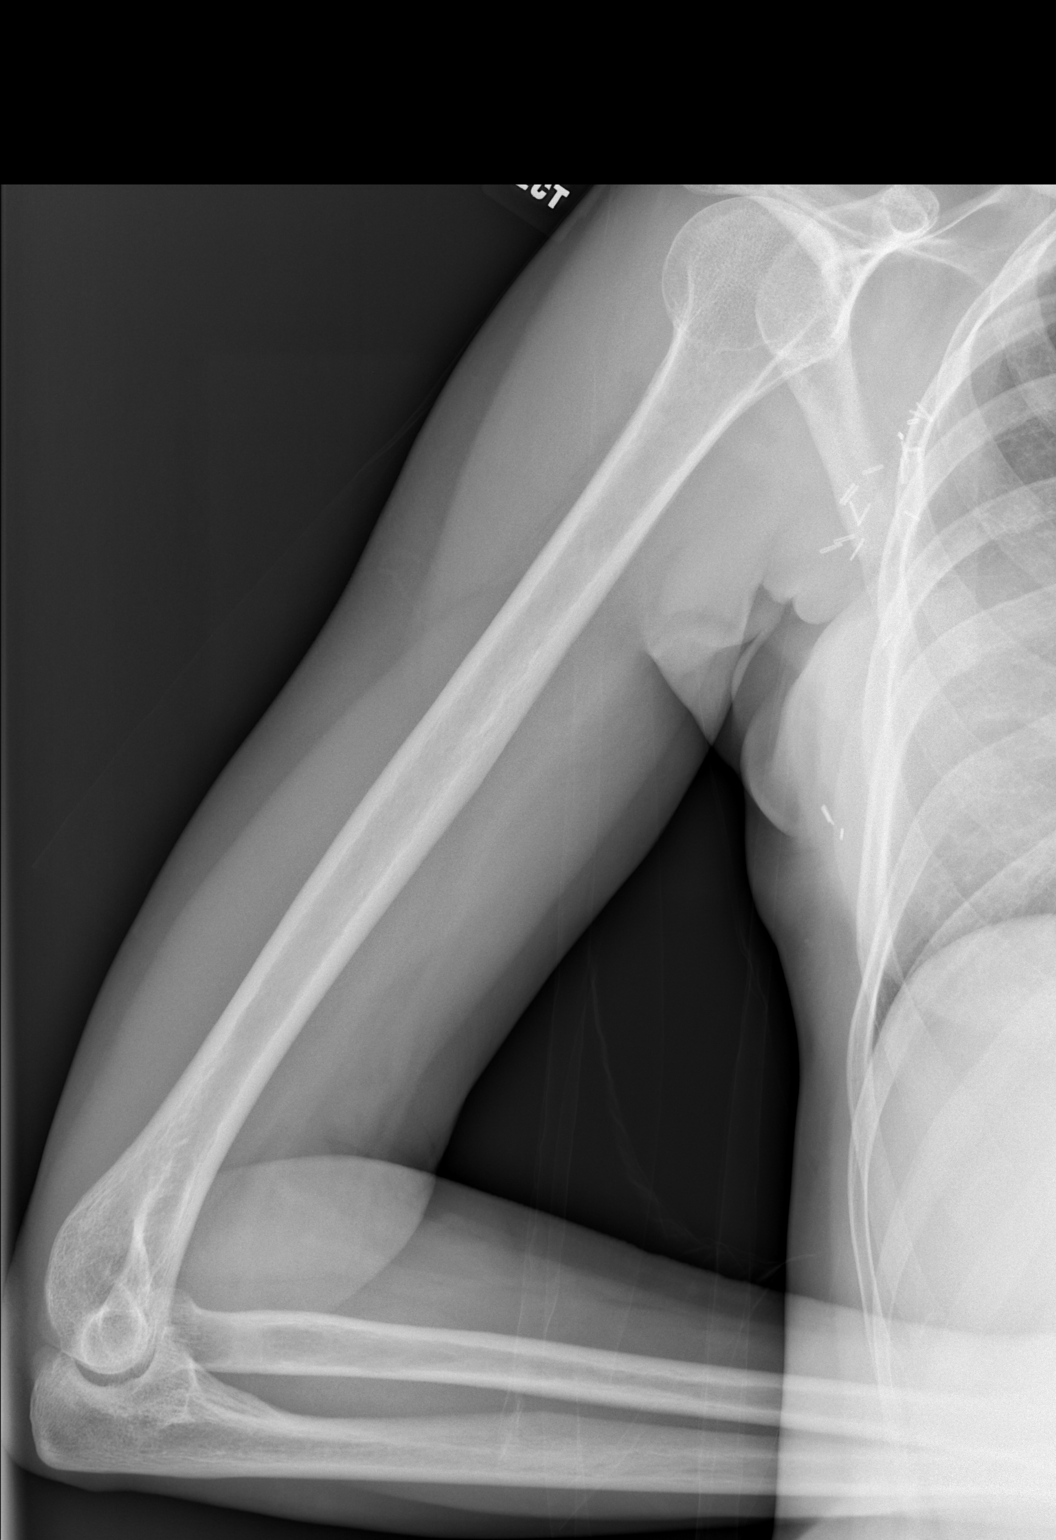

[2 of 2 positions shown; findings below may reference images not displayed]

FINDINGS: Frontal and lateral views were obtained. No fracture or dislocation.
Joint spaces appear normal. No blastic or lytic bone lesions. There
are surgical clips in the right axilla.
IMPRESSION: No fracture or dislocation.  No appreciable arthropathy.

## 2018-07-14 DIAGNOSIS — M791 Myalgia, unspecified site: Secondary | ICD-10-CM | POA: Diagnosis not present

## 2018-07-14 DIAGNOSIS — J029 Acute pharyngitis, unspecified: Secondary | ICD-10-CM | POA: Diagnosis not present

## 2018-07-27 ENCOUNTER — Emergency Department (HOSPITAL_COMMUNITY)
Admission: EM | Admit: 2018-07-27 | Discharge: 2018-07-27 | Disposition: A | Discharge status: Home / Self Care | Payer: 59 | Attending: Emergency Medicine | Admitting: Emergency Medicine

## 2018-07-27 ENCOUNTER — Encounter (HOSPITAL_COMMUNITY): Payer: Self-pay | Admitting: Emergency Medicine

## 2018-07-27 ENCOUNTER — Other Ambulatory Visit: Payer: Self-pay

## 2018-07-27 ENCOUNTER — Emergency Department (HOSPITAL_COMMUNITY): Payer: 59

## 2018-07-27 DIAGNOSIS — Y999 Unspecified external cause status: Secondary | ICD-10-CM | POA: Insufficient documentation

## 2018-07-27 DIAGNOSIS — Z79899 Other long term (current) drug therapy: Secondary | ICD-10-CM | POA: Diagnosis not present

## 2018-07-27 DIAGNOSIS — S060X1A Concussion with loss of consciousness of 30 minutes or less, initial encounter: Secondary | ICD-10-CM | POA: Insufficient documentation

## 2018-07-27 DIAGNOSIS — R51 Headache: Secondary | ICD-10-CM | POA: Diagnosis not present

## 2018-07-27 DIAGNOSIS — S0990XA Unspecified injury of head, initial encounter: Secondary | ICD-10-CM | POA: Diagnosis present

## 2018-07-27 DIAGNOSIS — Y9321 Activity, ice skating: Secondary | ICD-10-CM | POA: Insufficient documentation

## 2018-07-27 DIAGNOSIS — Y92330 Ice skating rink (indoor) (outdoor) as the place of occurrence of the external cause: Secondary | ICD-10-CM | POA: Insufficient documentation

## 2018-07-27 DIAGNOSIS — W000XXA Fall on same level due to ice and snow, initial encounter: Secondary | ICD-10-CM | POA: Diagnosis not present

## 2018-07-27 DIAGNOSIS — M542 Cervicalgia: Secondary | ICD-10-CM | POA: Diagnosis not present

## 2018-07-27 DIAGNOSIS — Z853 Personal history of malignant neoplasm of breast: Secondary | ICD-10-CM | POA: Diagnosis not present

## 2018-07-27 MED ORDER — DIPHENHYDRAMINE HCL 50 MG/ML IJ SOLN
25.0000 mg | Freq: Once | INTRAMUSCULAR | Status: AC
  Administered 2018-07-27: 25 mg via INTRAMUSCULAR
  Filled 2018-07-27: qty 1

## 2018-07-27 MED ORDER — PROCHLORPERAZINE EDISYLATE 10 MG/2ML IJ SOLN
10.0000 mg | Freq: Once | INTRAMUSCULAR | Status: AC
  Administered 2018-07-27: 10 mg via INTRAMUSCULAR
  Filled 2018-07-27: qty 2

## 2018-07-27 NOTE — ED Triage Notes (Signed)
Pt presents with family s/p fall [place] the ice rink. Patient was skating with her family when she slipped and fell on the ice. Patient states almost all of her impact was to the back of her head. Patient complains of a severe headache and "feeling slow." Patient noted to have delayed responses and slow speech.

## 2018-07-27 NOTE — ED Provider Notes (Signed)
[facility] DEPT Provider Note   CSN: [id number] Arrival date & time: 07/27/18  2122     History   Chief Complaint Chief Complaint  Patient presents with  . Fall  . Head Injury    HPI Kelly [name] is a 57 y.o. female.  57 yo F with a chief complaint of a closed head injury.  The patient was ice skating and she lost her balance and fell and struck the back of her head on the ice.  Thinks she may have lost consciousness for a very short time.  Since then she has been somewhat slow to respond.  Complaining of a headache to the posterior region of her head.  Bright lights seem to make this worse.  Has had some nausea but denies vomiting.  Some mild neck pain.  She denies extremity pain denies chest pain denies abdominal pain denies shortness of breath.  Denies numbness or weakness to the arms or legs.  Denies difficulty with speech or swallowing.  Denies blood thinner use.  The history is provided by the patient (family).  Fall  This is a new problem. The current episode started 3 to 5 hours ago. The problem occurs rarely. The problem has been resolved. Associated symptoms include headaches. Pertinent negatives include no chest pain, no abdominal pain and no shortness of breath. Nothing aggravates the symptoms. Nothing relieves the symptoms. She has tried nothing for the symptoms. The treatment provided no relief.  Head Injury   Pertinent negatives include no vomiting.    Past Medical History:  Diagnosis Date  . Abnormal Pap smear of cervix    with conization  . Anxiety   . Arthritis   . Breast cancer ([place]) 05/05/12   right, ER +, PR -, Her 2 -  . Depression   . History of radiation therapy 07/14/2012-09/03/12   right chest wall 59.4 gray  . Scoliosis    Followed by Dr. Harlow Asa   . Sleep apnea    cpap-has not been using-doing well without it    Patient Active Problem List   Diagnosis Date Noted  . Malignant neoplasm of overlapping sites of  right breast in female, estrogen receptor positive ([place]) 03/09/2017  . Breast replaced by other means 11/15/2013  . Status post bilateral breast implants 11/15/2013  . Status post bilateral breast reconstruction 11/15/2013  . Low back pain 05/31/2013  . Right breast cancer, IDC, multicentric 05/05/2012    Past Surgical History:  Procedure Laterality Date  . BREAST RECONSTRUCTION WITH PLACEMENT OF TISSUE EXPANDER AND FLEX HD (ACELLULAR HYDRATED DERMIS) Left 06/06/2013   Procedure: PLACEMENT OF LEFT BREAST TISSUE EXPANDER AND FLEX HD ;  Surgeon: Theodoro Kos, DO;  Location: [facility];  Service: Plastics;  Laterality: Left;  . BREAST SURGERY     several biopsies, needle aspiration  . CERVICAL CONIZATION W/BX    . CESAREAN SECTION     x3  . COSMETIC SURGERY  04/19/14   Dr. Migdalia Dk, mini tummy tuck, smoothed out right breast, left breast lift  . DILATATION & CURRETTAGE/HYSTEROSCOPY WITH RESECTOCOPE N/A 06/12/2014   Procedure: DILATATION & CURETTAGE/HYSTEROSCOPY ;  Surgeon: Lyman Speller, MD;  Location: [facility];  Service: Gynecology;  Laterality: N/A;  . JOINT REPLACEMENT     Rt and Lt thumbs  . LATISSIMUS FLAP TO BREAST Right 06/06/2013   Procedure: RIGHT LATISSIMUS MYOCUTANEOUS FLAP WITH PLACEMENT OF TISSUE EXPANDER IN RIGHT BREAST ;  Surgeon: Theodoro Kos, DO;  Location: [facility];  Service: Clinical cytogeneticist;  Laterality: Right;  . MASTECTOMY MODIFIED RADICAL  06/07/2012   Procedure: MASTECTOMY MODIFIED RADICAL;  Surgeon: Haywood Lasso, MD;  Location: [facility];  Service: General;  Laterality: Right;  . MASTECTOMY W/ SENTINEL NODE BIOPSY  06/07/2012   Procedure: MASTECTOMY WITH SENTINEL LYMPH NODE BIOPSY;  Surgeon: Haywood Lasso, MD;  Location: [facility];  Service: General;  Laterality: Right;  right total mastectomy and sentinel node  . REMOVAL OF BILATERAL TISSUE EXPANDERS WITH PLACEMENT OF BILATERAL BREAST IMPLANTS Bilateral 11/10/2013   Procedure: REMOVAL OF BILATERAL TISSUE EXPANDERS WITH  PLACEMENT OF SILICONE BREAST IMPLANTS WITH BILATERAL CAPSULOTOMIES;  Surgeon: Theodoro Kos, DO;  Location: [facility];  Service: Plastics;  Laterality: Bilateral;  . SIMPLE MASTECTOMY WITH AXILLARY SENTINEL NODE BIOPSY Left 09/30/2012   Procedure: SIMPLE MASTECTOMY;  Surgeon: Haywood Lasso, MD;  Location: [facility];  Service: General;  Laterality: Left;  left total mastectomy  . TUBAL LIGATION       OB History    Gravida  6   Para  3   Term  2   Preterm  1   AB  3   Living  3     SAB  3   TAB      Ectopic      Multiple      Live Births               Home Medications    Prior to Admission medications   Medication Sig Start Date End Date Taking? Authorizing Provider  amphetamine-dextroamphetamine (ADDERALL) 20 MG tablet Take 1 tablet by mouth daily.  09/22/17  Yes [provider]  L-Methylfolate-Algae (DEPLIN 15) 15-90.314 MG CAPS Take 1 capsule by mouth daily. 09/13/17  Yes [provider]  Potassium 99 MG TABS Take 1 tablet by mouth daily.   Yes [provider]  VIIBRYD 40 MG TABS Take 1 tablet by mouth daily. 09/13/17  Yes [provider]  ALPRAZolam Duanne Moron) 0.5 MG tablet Take 1 tablet by mouth daily as needed for anxiety.  08/05/17   [provider]  triamcinolone cream (KENALOG) 0.1 % Apply 1 application topically 2 (two) times daily. Patient not taking: Reported on 07/27/2018 04/03/17   Megan Salon, MD    Family History Family History  Problem Relation Age of Onset  . Breast cancer Mother 58  . Lymphoma Mother 53       non-hodgkins lymphoma in her epiglottis  . Cancer Mother        non-hodkins lymphoma, breast  . Hypertension Mother   . Heart disease Mother   . Deep vein thrombosis Mother   . Thyroid disease Mother   . Osteoporosis Mother   . Stroke Maternal Grandfather   . Bipolar disorder Brother   . Gout Brother   . Breast cancer Other        MGM's sister; dx <50  .  Breast cancer Other 44       MGF's sister  . Brain cancer Other        MGM's sister; dx <50  . Leukemia Other        mother's maternal cousin; dx under 10    Social History Social History   Tobacco Use  . Smoking status: Never Smoker  . Smokeless tobacco: Never Used  Substance Use Topics  . Alcohol use: Yes    Alcohol/week: 0.0 standard drinks    Comment: socially  . Drug use: No  Allergies   Doxycycline   Review of Systems Review of Systems  Constitutional: Negative for chills and fever.  HENT: Negative for congestion and rhinorrhea.   Eyes: Negative for redness and visual disturbance.  Respiratory: Negative for shortness of breath and wheezing.   Cardiovascular: Negative for chest pain and palpitations.  Gastrointestinal: Negative for abdominal pain, nausea and vomiting.  Genitourinary: Negative for dysuria and urgency.  Musculoskeletal: Negative for arthralgias and myalgias.  Skin: Negative for pallor and wound.  Neurological: Positive for headaches. Negative for dizziness.     Physical Exam Updated Vital Signs BP 115/75   Pulse 69   Temp 98.4 F (36.9 C) (Oral)   Resp 16   Ht 5\' 4"  (1.626 m)   Wt 59 kg   LMP 12/29/2013 Comment: spotting  SpO2 98%   BMI 22.31 kg/m   Physical Exam Vitals signs and nursing note reviewed.  Constitutional:      General: She is not in acute distress.    Appearance: She is well-developed. She is not diaphoretic.  HENT:     Head: Normocephalic and atraumatic.     Comments: No appreciable hematoma to the head but the patient is tender to the occiput worst on the left.  No wounds.  No midline spinal tenderness.  Able to turn her head 45 degrees in either direction. Eyes:     Pupils: Pupils are equal, round, and reactive to light.  Neck:     Musculoskeletal: Normal range of motion and neck supple.  Cardiovascular:     Rate and Rhythm: Normal rate and regular rhythm.     Heart sounds: No murmur. No friction rub. No  gallop.   Pulmonary:     Effort: Pulmonary effort is normal.     Breath sounds: No wheezing or rales.  Abdominal:     General: There is no distension.     Palpations: Abdomen is soft.     Tenderness: There is no abdominal tenderness.  Musculoskeletal:        General: No tenderness.  Skin:    General: Skin is warm and dry.  Neurological:     Mental Status: She is alert and oriented to person, place, and time.  Psychiatric:        Behavior: Behavior normal.      ED Treatments / Results  Labs (all labs ordered are listed, but only abnormal results are displayed) Labs Reviewed - No data to display  EKG None  Radiology Ct Head Wo Contrast  Result Date: 07/27/2018 CLINICAL DATA:  Status post fall backwards, hitting back of head. Headache and right-sided neck pain. Initial encounter. EXAM: CT HEAD WITHOUT CONTRAST CT CERVICAL SPINE WITHOUT CONTRAST TECHNIQUE: Multidetector CT imaging of the head and cervical spine was performed following the standard protocol without intravenous contrast. Multiplanar CT image reconstructions of the cervical spine were also generated. COMPARISON:  CT of the neck performed 08/23/2013, and MRI of the brain performed 08/27/2012 FINDINGS: CT HEAD FINDINGS Brain: No evidence of acute infarction, hemorrhage, hydrocephalus, extra-axial collection or mass lesion/mass effect. The posterior fossa, including the cerebellum, brainstem and fourth ventricle, is within normal limits. The third and lateral ventricles, and basal ganglia are unremarkable in appearance. The cerebral hemispheres are symmetric in appearance, with normal gray-white differentiation. No mass effect or midline shift is seen. Vascular: No hyperdense vessel or unexpected calcification. Skull: There is no evidence of fracture; visualized osseous structures are unremarkable in appearance. Sinuses/Orbits: The visualized portions of the orbits are within normal  limits. There is mild partial opacification  of the maxillary sinuses bilaterally. The remaining paranasal sinuses and mastoid air cells are well-aerated. Other: No significant soft tissue abnormalities are seen. CT CERVICAL SPINE FINDINGS Alignment: Normal. Skull base and vertebrae: No acute fracture. No primary bone lesion or focal pathologic process. Soft tissues and spinal canal: No prevertebral fluid or swelling. No visible canal hematoma. Disc levels: Intervertebral disc spaces are grossly preserved. The bony foramina are unremarkable in appearance. Upper chest: Mild scarring is noted at the right lung apex. The thyroid gland is unremarkable. Other: No additional soft tissue abnormalities are seen. IMPRESSION: 1. No evidence of traumatic intracranial injury or fracture. 2. No evidence of fracture or subluxation along the cervical spine. 3. Mild partial opacification of the maxillary sinuses bilaterally. 4. Mild scarring at the right lung apex. Electronically Signed   By: Garald Balding M.D.   On: 07/27/2018 22:45   Ct Cervical Spine Wo Contrast  Result Date: 07/27/2018 CLINICAL DATA:  Status post fall backwards, hitting back of head. Headache and right-sided neck pain. Initial encounter. EXAM: CT HEAD WITHOUT CONTRAST CT CERVICAL SPINE WITHOUT CONTRAST TECHNIQUE: Multidetector CT imaging of the head and cervical spine was performed following the standard protocol without intravenous contrast. Multiplanar CT image reconstructions of the cervical spine were also generated. COMPARISON:  CT of the neck performed 08/23/2013, and MRI of the brain performed 08/27/2012 FINDINGS: CT HEAD FINDINGS Brain: No evidence of acute infarction, hemorrhage, hydrocephalus, extra-axial collection or mass lesion/mass effect. The posterior fossa, including the cerebellum, brainstem and fourth ventricle, is within normal limits. The third and lateral ventricles, and basal ganglia are unremarkable in appearance. The cerebral hemispheres are symmetric in appearance, with  normal gray-white differentiation. No mass effect or midline shift is seen. Vascular: No hyperdense vessel or unexpected calcification. Skull: There is no evidence of fracture; visualized osseous structures are unremarkable in appearance. Sinuses/Orbits: The visualized portions of the orbits are within normal limits. There is mild partial opacification of the maxillary sinuses bilaterally. The remaining paranasal sinuses and mastoid air cells are well-aerated. Other: No significant soft tissue abnormalities are seen. CT CERVICAL SPINE FINDINGS Alignment: Normal. Skull base and vertebrae: No acute fracture. No primary bone lesion or focal pathologic process. Soft tissues and spinal canal: No prevertebral fluid or swelling. No visible canal hematoma. Disc levels: Intervertebral disc spaces are grossly preserved. The bony foramina are unremarkable in appearance. Upper chest: Mild scarring is noted at the right lung apex. The thyroid gland is unremarkable. Other: No additional soft tissue abnormalities are seen. IMPRESSION: 1. No evidence of traumatic intracranial injury or fracture. 2. No evidence of fracture or subluxation along the cervical spine. 3. Mild partial opacification of the maxillary sinuses bilaterally. 4. Mild scarring at the right lung apex. Electronically Signed   By: Garald Balding M.D.   On: 07/27/2018 22:45    Procedures Procedures (including critical care time)  Medications Ordered in ED Medications  prochlorperazine (COMPAZINE) injection 10 mg (10 mg Intramuscular Given 07/27/18 2305)  diphenhydrAMINE (BENADRYL) injection 25 mg (25 mg Intramuscular Given 07/27/18 2306)     Initial Impression / Assessment and Plan / ED Course  I have reviewed the triage vital signs and the nursing notes.  Pertinent labs & imaging results that were available during my care of the patient were reviewed by me and considered in my medical decision making (see chart for details).     57 yo F with a  chief complaint of a closed head  injury.  CT of the head and C-spine were performed and negative.  Given a headache cocktail.  PCP follow-up.  11:33 PM:  I have discussed the diagnosis/risks/treatment options with the patient and family and believe the pt to be eligible for discharge home to follow-up with PCP. We also discussed returning to the ED immediately if new or worsening sx occur. We discussed the sx which are most concerning (e.g., sudden worsening pain, fever, inability to tolerate by mouth) that necessitate immediate return. Medications administered to the patient during their visit and any new prescriptions provided to the patient are listed below.  Medications given during this visit Medications  prochlorperazine (COMPAZINE) injection 10 mg (10 mg Intramuscular Given 07/27/18 2305)  diphenhydrAMINE (BENADRYL) injection 25 mg (25 mg Intramuscular Given 07/27/18 2306)      The patient appears reasonably screen and/or stabilized for discharge and I doubt any other medical condition or other [facility] requiring further screening, evaluation, or treatment in the ED at this time prior to discharge.    Final Clinical Impressions(s) / ED Diagnoses   Final diagnoses:  Concussion with loss of consciousness of 30 minutes or less, initial encounter    ED Discharge Orders    None       Deno Etienne, DO 07/27/18 2333

## 2018-07-27 NOTE — Discharge Instructions (Signed)
Tylenol and motrin for pain.  Follow up with your family doctor.    Things that use your brain usually make this worse.

## 2018-07-27 NOTE — ED Notes (Signed)
Bed: WA13 Expected date:  Expected time:  Means of arrival:  Comments: Triage 2 

## 2018-10-27 DIAGNOSIS — T798XXA Other early complications of trauma, initial encounter: Secondary | ICD-10-CM | POA: Diagnosis not present

## 2018-10-27 DIAGNOSIS — T22012A Burn of unspecified degree of left forearm, initial encounter: Secondary | ICD-10-CM | POA: Diagnosis not present

## 2019-08-22 MED FILL — VIIBRYD 40 MG TABLET: 40 | 90 days supply | Qty: 90 | Fill #0

## 2019-11-03 MED FILL — DEXTROAMP-AMPHETAMIN 20 MG: 20 | 30 days supply | Qty: 60 | Fill #0

## 2019-11-24 MED FILL — VIIBRYD 40 MG TABLET: 40 | 90 days supply | Qty: 90 | Fill #0

## 2020-01-24 MED FILL — DEXTROAMP-AMPHETAMIN 20 MG: 20 | 30 days supply | Qty: 60 | Fill #0

## 2020-02-17 ENCOUNTER — Telehealth: Payer: Self-pay

## 2020-02-17 NOTE — Telephone Encounter (Signed)
I am sorry but I cannot write this either.  There is no evidence that Covid vaccination is contraindicated with hx of breast cancer or any other medical condition she has or medication she takes.  I know this is not what she wants me to say and I am if this is upsetting.

## 2020-02-17 NOTE — Telephone Encounter (Signed)
Patient is calling in regards to wanting an appointment to speak with Dr. Sabra Heck about health issues.

## 2020-02-17 NOTE — Telephone Encounter (Signed)
Spoke with patient. Hx of right breast cancer. Patient is requesting letter for employer for exemption of [phone] vaccine. Advised patient she would need to f/u with PCP or oncologist to discuss recommendations and letter. Patient states she has contacted her PCP, will not provide letter.   Last AEX 04/03/17 OV 10/30/17  Advised patient I will forward to Dr. Sabra Heck for review, our office will f/u with any additional recommendations.   Routing to Dr. Sabra Heck.

## 2020-02-17 NOTE — Telephone Encounter (Signed)
Spoke with patient, advised as seen below per Dr. Sabra Heck.  Patient verbalizes understanding and thankful for return call.  Encounter closed.

## 2020-03-09 MED FILL — AMPHETAMINE-DEXTROAMPHETAMI: 20 | 15 days supply | Qty: 30 | Fill #0

## 2020-03-09 MED FILL — VIIBRYD 40 MG TABLET: 40 | 90 days supply | Qty: 90 | Fill #0

## 2020-05-02 ENCOUNTER — Other Ambulatory Visit (HOSPITAL_COMMUNITY): Payer: Self-pay | Admitting: Psychiatry

## 2020-05-02 MED FILL — AMPHETAMINE-DEXTROAMPHETAMI: 20 | 15 days supply | Qty: 30 | Fill #0

## 2020-06-20 MED FILL — VIIBRYD 40 MG TABLET: 40 | 90 days supply | Qty: 90 | Fill #0

## 2020-07-14 ENCOUNTER — Emergency Department (HOSPITAL_COMMUNITY)
Admission: EM | Admit: 2020-07-14 | Discharge: 2020-07-14 | Disposition: A | Discharge status: Home / Self Care | Payer: No Typology Code available for payment source | Attending: Emergency Medicine | Admitting: Emergency Medicine

## 2020-07-14 ENCOUNTER — Other Ambulatory Visit: Payer: Self-pay

## 2020-07-14 ENCOUNTER — Encounter (HOSPITAL_COMMUNITY): Payer: Self-pay | Admitting: *Deleted

## 2020-07-14 ENCOUNTER — Emergency Department (HOSPITAL_COMMUNITY): Payer: No Typology Code available for payment source

## 2020-07-14 DIAGNOSIS — Z170 Estrogen receptor positive status [ER+]: Secondary | ICD-10-CM | POA: Insufficient documentation

## 2020-07-14 DIAGNOSIS — C50911 Malignant neoplasm of unspecified site of right female breast: Secondary | ICD-10-CM | POA: Diagnosis not present

## 2020-07-14 DIAGNOSIS — R079 Chest pain, unspecified: Secondary | ICD-10-CM | POA: Diagnosis present

## 2020-07-14 DIAGNOSIS — R0602 Shortness of breath: Secondary | ICD-10-CM | POA: Insufficient documentation

## 2020-07-14 LAB — CBC
HCT: 41.3 % (ref 36.0–46.0)
Hemoglobin: 14 g/dL (ref 12.0–15.0)
MCH: 30.7 pg (ref 26.0–34.0)
MCHC: 33.9 g/dL (ref 30.0–36.0)
MCV: 90.6 fL (ref 80.0–100.0)
Platelets: 239 10*3/uL (ref 150–400)
RBC: 4.56 MIL/uL (ref 3.87–5.11)
RDW: 12.9 % (ref 11.5–15.5)
WBC: 5.7 10*3/uL (ref 4.0–10.5)
nRBC: 0 % (ref 0.0–0.2)

## 2020-07-14 LAB — BASIC METABOLIC PANEL
Anion gap: 12 (ref 5–15)
BUN: 24 mg/dL — ABNORMAL HIGH (ref 6–20)
CO2: 24 mmol/L (ref 22–32)
Calcium: 9.3 mg/dL (ref 8.9–10.3)
Chloride: 105 mmol/L (ref 98–111)
Creatinine, Ser: 0.78 mg/dL (ref 0.44–1.00)
GFR, Estimated: >60 mL/min (ref >60)
Glucose, Bld: 109 mg/dL — ABNORMAL HIGH (ref 70–99)
Potassium: 4 mmol/L (ref 3.5–5.1)
Sodium: 141 mmol/L (ref 135–145)

## 2020-07-14 LAB — TROPONIN I (HIGH SENSITIVITY)
Troponin I (High Sensitivity): <2 ng/L (ref <18)
Troponin I (High Sensitivity): <2 ng/L (ref <18)

## 2020-07-14 MED ORDER — ALUM & MAG HYDROXIDE-SIMETH 200-200-20 MG/5ML PO SUSP
30.0000 mL | Freq: Once | ORAL | Status: AC
  Administered 2020-07-14: 30 mL via ORAL
  Filled 2020-07-14: qty 30

## 2020-07-14 NOTE — ED Provider Notes (Signed)
[facility] DEPT Provider Note   CSN: [id number] Arrival date & time: 07/14/20  1313     History Chief Complaint  Patient presents with  . Chest Pain    Kelly [name] is a 59 y.o. 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  She reports associated symptoms of shortness of breath.  She states that this is never happened before.  She works here in the ED admitting lab and that is when her symptoms began, approximately 1:00 PM.    On my examination, she states that her symptoms have already started to improve.  She does not smoke and denies any cardiac risk factors.  No abdominal pain, nausea, emesis, cough, dizziness, syncope, hemoptysis, extremity swelling or edema, or other symptoms.    Patient is not immunized for COVID-19, but is checked weekly per hospital requirements.  Denies any cough or symptoms of congestion.   HPI    Past Medical History:  Diagnosis Date  . Abnormal Pap smear of cervix    with conization  . Anxiety   . Arthritis   . Breast cancer ([place]) 05/05/12   right, ER +, PR -, Her 2 -  . Depression   . History of radiation therapy 07/14/2012-09/03/12   right chest wall 59.4 gray  . Scoliosis    Followed by Dr. Harlow Asa   . Sleep apnea    cpap-has not been using-doing well without it    Patient Active Problem List   Diagnosis Date Noted  . Malignant neoplasm of overlapping sites of right breast in female, estrogen receptor positive ([place]) 03/09/2017  . Breast replaced by other means 11/15/2013  . Status post bilateral breast implants 11/15/2013  . Status post bilateral breast reconstruction 11/15/2013  . Low back pain 05/31/2013  . Right breast cancer, IDC, multicentric 05/05/2012    Past Surgical History:  Procedure Laterality Date  . BREAST RECONSTRUCTION WITH  PLACEMENT OF TISSUE EXPANDER AND FLEX HD (ACELLULAR HYDRATED DERMIS) Left 06/06/2013   Procedure: PLACEMENT OF LEFT BREAST TISSUE EXPANDER AND FLEX HD ;  Surgeon: Theodoro Kos, DO;  Location: [facility];  Service: Plastics;  Laterality: Left;  . BREAST SURGERY     several biopsies, needle aspiration  . CERVICAL CONIZATION W/BX    . CESAREAN SECTION     x3  . COSMETIC SURGERY  04/19/14   Dr. Migdalia Dk, mini tummy tuck, smoothed out right breast, left breast lift  . DILATATION & CURRETTAGE/HYSTEROSCOPY WITH RESECTOCOPE N/A 06/12/2014   Procedure: DILATATION & CURETTAGE/HYSTEROSCOPY ;  Surgeon: Lyman Speller, MD;  Location: [facility];  Service: Gynecology;  Laterality: N/A;  . JOINT REPLACEMENT     Rt and Lt thumbs  . LATISSIMUS FLAP TO BREAST Right 06/06/2013   Procedure: RIGHT LATISSIMUS MYOCUTANEOUS FLAP WITH PLACEMENT OF TISSUE EXPANDER IN RIGHT BREAST ;  Surgeon: Theodoro Kos, DO;  Location: [facility];  Service: Plastics;  Laterality: Right;  . MASTECTOMY MODIFIED RADICAL  06/07/2012   Procedure: MASTECTOMY MODIFIED RADICAL;  Surgeon: Haywood Lasso, MD;  Location: [facility];  Service: General;  Laterality: Right;  . MASTECTOMY W/ SENTINEL NODE BIOPSY  06/07/2012   Procedure: MASTECTOMY WITH SENTINEL LYMPH NODE BIOPSY;  Surgeon: Haywood Lasso, MD;  Location: [facility];  Service: General;  Laterality: Right;  right total mastectomy and sentinel node  .  REMOVAL OF BILATERAL TISSUE EXPANDERS WITH PLACEMENT OF BILATERAL BREAST IMPLANTS Bilateral 11/10/2013   Procedure: REMOVAL OF BILATERAL TISSUE EXPANDERS WITH PLACEMENT OF SILICONE BREAST IMPLANTS WITH BILATERAL CAPSULOTOMIES;  Surgeon: Theodoro Kos, DO;  Location: [facility];  Service: Plastics;  Laterality: Bilateral;  . SIMPLE MASTECTOMY WITH AXILLARY SENTINEL NODE BIOPSY Left 09/30/2012   Procedure: SIMPLE MASTECTOMY;  Surgeon: Haywood Lasso, MD;  Location: [facility];  Service: General;  Laterality: Left;  left  total mastectomy  . TUBAL LIGATION       OB History    Gravida  6   Para  3   Term  2   Preterm  1   AB  3   Living  3     SAB  3   IAB      Ectopic      Multiple      Live Births              Family History  Problem Relation Age of Onset  . Breast cancer Mother 68  . Lymphoma Mother 65       non-hodgkins lymphoma in her epiglottis  . Cancer Mother        non-hodkins lymphoma, breast  . Hypertension Mother   . Heart disease Mother   . Deep vein thrombosis Mother   . Thyroid disease Mother   . Osteoporosis Mother   . Stroke Maternal Grandfather   . Bipolar disorder Brother   . Gout Brother   . Breast cancer Other        MGM's sister; dx <50  . Breast cancer Other 35       MGF's sister  . Brain cancer Other        MGM's sister; dx <50  . Leukemia Other        mother's maternal cousin; dx under 10    Social History   Tobacco Use  . Smoking status: Never Smoker  . Smokeless tobacco: Never Used  Substance Use Topics  . Alcohol use: Not Currently    Alcohol/week: 0.0 standard drinks    Comment: socially  . Drug use: No    Home Medications Prior to Admission medications   Medication Sig Start Date End Date Taking? Authorizing Provider  ALPRAZolam Duanne Moron) 0.5 MG tablet Take 1 tablet by mouth daily as needed for anxiety.  08/05/17   [provider]  amphetamine-dextroamphetamine (ADDERALL) 20 MG tablet Take 1 tablet by mouth daily.  09/22/17   [provider]  L-Methylfolate-Algae (DEPLIN 15) 15-90.314 MG CAPS Take 1 capsule by mouth daily. 09/13/17   [provider]  Potassium 99 MG TABS Take 1 tablet by mouth daily.    [provider]  triamcinolone cream (KENALOG) 0.1 % Apply 1 application topically 2 (two) times daily. Patient not taking: Reported on 07/27/2018 04/03/17   Megan Salon, MD  VIIBRYD 40 MG TABS Take 1 tablet by mouth daily. 09/13/17   [provider]    Allergies     Doxycycline  Review of Systems   Review of Systems  All other systems reviewed and are negative.   Physical Exam Updated Vital Signs BP 126/75   Pulse 66   Temp 98.1 F (36.7 C) (Oral)   Resp 12   Ht 5\' 5"  (1.651 m)   Wt 59 kg   LMP 12/29/2013   SpO2 97%   BMI 21.63 kg/m   Physical Exam Vitals and nursing note reviewed. Exam  conducted with a chaperone present.  Constitutional:      General: She is not in acute distress.    Appearance: Normal appearance. She is not toxic-appearing.  HENT:     Head: Normocephalic and atraumatic.  Eyes:     General: No scleral icterus.    Conjunctiva/sclera: Conjunctivae normal.  Cardiovascular:     Rate and Rhythm: Normal rate and regular rhythm.     Pulses: Normal pulses.     Heart sounds: Normal heart sounds.  Pulmonary:     Effort: Pulmonary effort is normal. No respiratory distress.     Breath sounds: Normal breath sounds. No stridor. No wheezing, rhonchi or rales.     Comments: CTA bilaterally. No chest wall tenderness. Chest:     Chest wall: No tenderness.  Abdominal:     General: Abdomen is flat. There is no distension.     Palpations: Abdomen is soft. There is no mass.     Tenderness: There is no abdominal tenderness. There is no guarding or rebound.     Hernia: No hernia is present.  Musculoskeletal:     Cervical back: Normal range of motion.     Right lower leg: No edema.     Left lower leg: No edema.  Skin:    General: Skin is dry.     Capillary Refill: Capillary refill takes less than 2 seconds.  Neurological:     General: No focal deficit present.     Mental Status: She is alert and oriented to person, place, and time.     GCS: GCS eye subscore is 4. GCS verbal subscore is 5. GCS motor subscore is 6.  Psychiatric:        Mood and Affect: Mood normal.        Behavior: Behavior normal.        Thought Content: Thought content normal.     ED Results / Procedures / Treatments   Labs (all labs ordered are  listed, but only abnormal results are displayed) Labs Reviewed  BASIC METABOLIC PANEL - Abnormal; Notable for the following components:      Result Value   Glucose, Bld 109 (*)    BUN 24 (*)    All other components within normal limits  CBC  TROPONIN I (HIGH SENSITIVITY)  TROPONIN I (HIGH SENSITIVITY)    EKG EKG Interpretation  Date/Time:  Saturday July 14 2020 13:14:19 EST Ventricular Rate:  74 PR Interval:    QRS Duration: 90 QT Interval:  381 QTC Calculation: 423 R Axis:   64 Text Interpretation: Sinus rhythm 12 Lead; Mason-Likar No significant change since last tracing Confirmed by Lacretia Leigh ([id number]) on 07/14/2020 1:22:23 PM   Radiology DG Chest Portable 1 View  Result Date: 07/14/2020 CLINICAL DATA:  Shortness of breath. EXAM: PORTABLE CHEST 1 VIEW COMPARISON:  03/28/2014. FINDINGS: The heart size and mediastinal contours are within normal limits. No consolidation. Hazy apparent opacities in the lower lungs is favored to relate to overlying breast reconstructions. No visible pleural effusions or pneumothorax. No acute osseous abnormality. Bilateral axillary clips. IMPRESSION: No evidence of acute cardiopulmonary disease. Electronically Signed   By: Margaretha Sheffield MD   On: 07/14/2020 14:34    Procedures Procedures (including critical care time)  Medications Ordered in ED Medications  alum & mag hydroxide-simeth (MAALOX/MYLANTA) 200-200-20 MG/5ML suspension 30 mL (30 mLs Oral Given 07/14/20 1426)    ED Course  I have reviewed the triage vital signs and the nursing notes.  Pertinent labs &  imaging results that were available during my care of the patient were reviewed by me and considered in my medical decision making (see chart for details).    MDM Rules/Calculators/A&P HEAR Score: 1                        Labs Troponin: 2 >> 2.  CBC: No anemia or leukocytosis concerning for infection. BMP: Unremarkable.  Imaging DG chest is personally reviewed and  demonstrates no acute cardiopulmonary disease.  EKG with sinus rhythm, no significant changes when compared to prior tracing.    Patient's physical exam and work-up is largely reassuring.  While patient complains of sudden onset chest discomfort, I have low suspicion for emergent pathology.  Patient is PERC negative.  Low HEAR score.  Reasonable for follow-up with her primary care provider early next week for ongoing evaluation and management.  ED return precautions discussed.  Patient voices understanding and is agreeable to the plan.    Final Clinical Impression(s) / ED Diagnoses Final diagnoses:  Nonspecific chest pain    Rx / DC Orders ED Discharge Orders    None       Corena Herter, PA-C 07/14/20 1648    Lacretia Leigh, MD 07/15/20 1314

## 2020-07-14 NOTE — Discharge Instructions (Addendum)
Your physical exam and work-up here in the ED was entirely reassuring.   Please follow-up with your primary care provider for ongoing evaluation and management.  Return to the ED or seek immediate medical attention should you experience any new or worsening symptoms.

## 2020-07-14 NOTE — ED Triage Notes (Signed)
Pt was at work with sudden onset of upper chest pain, stabbing in nature, felt as if something was not normal. No other symptoms.

## 2020-07-24 ENCOUNTER — Telehealth: Payer: Self-pay

## 2020-07-24 NOTE — Telephone Encounter (Signed)
WRONG INFORMATION ABOVE.   NOTES ON FILE FROM [facility] [phone], SENT REFERRAL TO SCHEDULING

## 2020-07-24 NOTE — Telephone Encounter (Signed)
NOTES ON FILE FROM DR JOHN MCCOMB [phone], SENT REFERRAL TO SCHEDULING 

## 2020-07-31 ENCOUNTER — Other Ambulatory Visit (HOSPITAL_COMMUNITY): Payer: Self-pay | Admitting: Psychiatry

## 2020-08-01 ENCOUNTER — Other Ambulatory Visit (HOSPITAL_COMMUNITY): Payer: Self-pay | Admitting: Psychiatry

## 2020-08-01 MED FILL — AMPHETAMINE-DEXTROAMPHETAMI: 20 | 15 days supply | Qty: 30 | Fill #0

## 2020-08-13 NOTE — Progress Notes (Deleted)
Cardiology Office Note:    Date:  08/13/2020   ID:  Kelly [name], DOB 1961/07/21, MRN [id number]  PCP:  Patient, No Pcp Per  [facility] Cardiologist:  No primary care provider on file.  [facility] Electrophysiologist:  None   Referring MD: Shon Hale, *    History of Present Illness:    Kelly [name] is a 60 y.o. female with a hx of right breast invasive ductal carcinoma s/p mastectomy and XRT in remission and anxiety who was referred by Dr. Chanetta Marshall for further evaluation of chest pain.  Patient seen in [facility] ED on 07/14/20 for acute onset 10/10 stabbing chest pain with SOB. Top negative. ECG with no ischemic changes. Given reassuring exam and work-up, she was discharged home with plans for follow-up with PCP.   Past Medical History:  Diagnosis Date  . Abnormal Pap smear of cervix    with conization  . Anxiety   . Arthritis   . Breast cancer ([facility]) 05/05/12   right, ER +, PR -, Her 2 -  . Depression   . History of radiation therapy 07/14/2012-09/03/12   right chest wall 59.4 gray  . Scoliosis    Followed by Dr. Ottis Stain   . Sleep apnea    cpap-has not been using-doing well without it    Past Surgical History:  Procedure Laterality Date  . BREAST RECONSTRUCTION WITH PLACEMENT OF TISSUE EXPANDER AND FLEX HD (ACELLULAR HYDRATED DERMIS) Left 06/06/2013   Procedure: PLACEMENT OF LEFT BREAST TISSUE EXPANDER AND FLEX HD ;  Surgeon: Wayland Denis, DO;  Location: [facility];  Service: Plastics;  Laterality: Left;  . BREAST SURGERY     several biopsies, needle aspiration  . CERVICAL CONIZATION W/BX    . CESAREAN SECTION     x3  . COSMETIC SURGERY  04/19/14   Dr. Kelly Splinter, mini tummy tuck, smoothed out right breast, left breast lift  . DILATATION & CURRETTAGE/HYSTEROSCOPY WITH RESECTOCOPE N/A 06/12/2014   Procedure: DILATATION & CURETTAGE/HYSTEROSCOPY ;  Surgeon: Annamaria Boots, MD;  Location: [facility];  Service: Gynecology;  Laterality: N/A;  . JOINT REPLACEMENT      Rt and Lt thumbs  . LATISSIMUS FLAP TO BREAST Right 06/06/2013   Procedure: RIGHT LATISSIMUS MYOCUTANEOUS FLAP WITH PLACEMENT OF TISSUE EXPANDER IN RIGHT BREAST ;  Surgeon: Wayland Denis, DO;  Location: [facility];  Service: Plastics;  Laterality: Right;  . MASTECTOMY MODIFIED RADICAL  06/07/2012   Procedure: MASTECTOMY MODIFIED RADICAL;  Surgeon: Currie Paris, MD;  Location: [facility];  Service: General;  Laterality: Right;  . MASTECTOMY W/ SENTINEL NODE BIOPSY  06/07/2012   Procedure: MASTECTOMY WITH SENTINEL LYMPH NODE BIOPSY;  Surgeon: Currie Paris, MD;  Location: [facility];  Service: General;  Laterality: Right;  right total mastectomy and sentinel node  . REMOVAL OF BILATERAL TISSUE EXPANDERS WITH PLACEMENT OF BILATERAL BREAST IMPLANTS Bilateral 11/10/2013   Procedure: REMOVAL OF BILATERAL TISSUE EXPANDERS WITH PLACEMENT OF SILICONE BREAST IMPLANTS WITH BILATERAL CAPSULOTOMIES;  Surgeon: Wayland Denis, DO;  Location: [facility];  Service: Plastics;  Laterality: Bilateral;  . SIMPLE MASTECTOMY WITH AXILLARY SENTINEL NODE BIOPSY Left 09/30/2012   Procedure: SIMPLE MASTECTOMY;  Surgeon: Currie Paris, MD;  Location: [facility];  Service: General;  Laterality: Left;  left total mastectomy  . TUBAL LIGATION      Current Medications: No outpatient medications have been marked as taking for the 08/23/20 encounter (Appointment) with Meriam Sprague, MD.  Allergies:   Doxycycline   Social History   Socioeconomic History  . Marital status: Married    Spouse name: Not on file  . Number of children: Not on file  . Years of education: Not on file  . Highest education level: Not on file  Occupational History  . Not on file  Tobacco Use  . Smoking status: Never Smoker  . Smokeless tobacco: Never Used  Substance and Sexual Activity  . Alcohol use: Not Currently    Alcohol/week: 0.0 standard drinks    Comment: socially  . Drug use: No  . Sexual  activity: Yes    Partners: Male    Birth control/protection: Surgical    Comment: menarche 22, P6,G 3, 57st pregnancy-stillborn age 61, BTL  Other Topics Concern  . Not on file  Social History Narrative  . Not on file   Social Determinants of Health   Financial Resource Strain: Not on file  Food Insecurity: Not on file  Transportation Needs: Not on file  Physical Activity: Not on file  Stress: Not on file  Social Connections: Not on file     Family History: The patient's ***family history includes Bipolar disorder in her brother; Brain cancer in an other family member; Breast cancer in an other family member; Breast cancer (age of onset: 64) in an other family member; Breast cancer (age of onset: 25) in her mother; Cancer in her mother; Deep vein thrombosis in her mother; Gout in her brother; Heart disease in her mother; Hypertension in her mother; Leukemia in an other family member; Lymphoma (age of onset: 28) in her mother; Osteoporosis in her mother; Stroke in her maternal grandfather; Thyroid disease in her mother.  ROS:   Please see the history of present illness.    *** All other systems reviewed and are negative.  EKGs/Labs/Other Studies Reviewed:    The following studies were reviewed today: CTA PE protocol 10/11/2013: FINDINGS:  No pathologically enlarged mediastinal, hilar, or axillary lymph  nodes are identified.   Aorta and great vessels are within normal limits. No aortic aneurysm  or dissection.   Heart size is normal. No pericardial effusion.   Pulmonary arterial tree is well opacified. No filling defect to  suggest acute pulmonary embolus. Re-formatted imaging confirms these  findings.   The lungs are clear without focal infiltrate, pulmonary edema, or  pleural effusion. Subsegmental atelectasis seen dependently within  the lower lobes, right greater than left. No pulmonary nodule or  mass.   Visualized upper abdomen is within normal limits.    Postsurgical changes from prior bilateral mastectomies with  bilateral breast implants noted.   There is an acute fracture of the right anterior fourth and fifth  ribs. Underlying atelectatic changes seen within the right middle  lobe. No pneumothorax.   No worrisome lytic or blastic osseous lesions identified.   IMPRESSION:  1. No CT evidence of acute pulmonary embolism.  2. Acute minimally displaced fractures of the right anterior fourth  and fifth ribs, just underneath the right breast implant. The right  breast implant is intact, and there is no pneumothorax.  3. Sequelae of prior bilateral mastectomies with bilateral breast  implants in place.   EKG:  EKG is *** ordered today.  The ekg ordered today demonstrates ***  Recent Labs: 07/14/2020: BUN 24; Creatinine, Ser 0.78; Hemoglobin 14.0; Platelets 239; Potassium 4.0; Sodium 141  Recent Lipid Panel    Component Value Date/Time   CHOL 203 (H) 01/03/2016 1212  TRIG 141 01/03/2016 1212   HDL 73 01/03/2016 1212   CHOLHDL 2.8 01/03/2016 1212   VLDL 28 01/03/2016 1212   LDLCALC 102 01/03/2016 1212     Risk Assessment/Calculations:   {Does this patient have ATRIAL FIBRILLATION?:[phone]}   Physical Exam:    VS:  There were no vitals taken for this visit.    Wt Readings from Last 3 Encounters:  07/14/20 130 lb (59 kg)  07/27/18 130 lb (59 kg)  10/30/17 129 lb (58.5 kg)     GEN: *** Well nourished, well developed in no acute distress HEENT: Normal NECK: No JVD; No carotid bruits LYMPHATICS: No lymphadenopathy CARDIAC: ***RRR, no murmurs, rubs, gallops RESPIRATORY:  Clear to auscultation without rales, wheezing or rhonchi  ABDOMEN: Soft, non-tender, non-distended MUSCULOSKELETAL:  No edema; No deformity  SKIN: Warm and dry NEUROLOGIC:  Alert and oriented x 3 PSYCHIATRIC:  Normal affect   ASSESSMENT:    No diagnosis found. PLAN:    In order of problems listed above:  #Chest pain:   {Are you  ordering a CV Procedure (e.g. stress test, cath, DCCV, TEE, etc)?   Press F2        :[id number]}    Medication Adjustments/Labs and Tests Ordered: Current medicines are reviewed at length with the patient today.  Concerns regarding medicines are outlined above.  No orders of the defined types were placed in this encounter.  No orders of the defined types were placed in this encounter.   There are no Patient Instructions on file for this visit.   Signed, Freada Bergeron, MD  08/13/2020 2:11 PM    [facility]

## 2020-08-23 ENCOUNTER — Ambulatory Visit: Payer: No Typology Code available for payment source | Admitting: Cardiology

## 2020-08-31 ENCOUNTER — Other Ambulatory Visit (HOSPITAL_COMMUNITY): Payer: Self-pay | Admitting: Psychiatry

## 2020-08-31 MED FILL — VIIBRYD 40 MG TABLET: 40 | 90 days supply | Qty: 90 | Fill #0

## 2020-08-31 MED FILL — AMPHETAMINE-DEXTROAMPHETAMI: 20 | 15 days supply | Qty: 30 | Fill #0

## 2020-09-07 ENCOUNTER — Encounter: Payer: Self-pay | Admitting: General Practice

## 2020-10-18 ENCOUNTER — Other Ambulatory Visit (HOSPITAL_BASED_OUTPATIENT_CLINIC_OR_DEPARTMENT_OTHER): Payer: Self-pay

## 2020-11-16 ENCOUNTER — Other Ambulatory Visit (HOSPITAL_COMMUNITY): Payer: Self-pay

## 2020-11-16 MED FILL — Amphetamine-Dextroamphetamine Tab 20 MG: ORAL | 30 days supply | Qty: 30 | Fill #0 | Status: AC

## 2020-11-22 ENCOUNTER — Other Ambulatory Visit (HOSPITAL_COMMUNITY): Payer: Self-pay

## 2020-11-22 MED ORDER — VIIBRYD 40 MG PO TABS
ORAL_TABLET | ORAL | 0 refills | Status: DC
  Filled 2020-11-22 – 2020-12-05 (×2): qty 90, 90d supply, fill #0

## 2020-11-22 MED ORDER — AMPHETAMINE-DEXTROAMPHETAMINE 20 MG PO TABS
ORAL_TABLET | ORAL | 0 refills | Status: DC
  Filled 2020-12-18: qty 30, 15d supply, fill #0

## 2020-11-22 MED ORDER — AMPHETAMINE-DEXTROAMPHETAMINE 20 MG PO TABS: ORAL_TABLET | ORAL | 0 refills | Status: DC

## 2020-11-22 MED ORDER — AMPHETAMINE-DEXTROAMPHETAMINE 20 MG PO TABS
ORAL_TABLET | ORAL | 0 refills | Status: DC
  Filled 2021-01-17: qty 30, 30d supply, fill #0

## 2020-11-23 ENCOUNTER — Other Ambulatory Visit (HOSPITAL_COMMUNITY): Payer: Self-pay

## 2020-11-29 ENCOUNTER — Other Ambulatory Visit (HOSPITAL_COMMUNITY): Payer: Self-pay

## 2020-12-05 ENCOUNTER — Other Ambulatory Visit (HOSPITAL_COMMUNITY): Payer: Self-pay

## 2020-12-18 ENCOUNTER — Other Ambulatory Visit (HOSPITAL_COMMUNITY): Payer: Self-pay

## 2021-01-17 ENCOUNTER — Other Ambulatory Visit (HOSPITAL_COMMUNITY): Payer: Self-pay

## 2021-01-18 ENCOUNTER — Other Ambulatory Visit (HOSPITAL_COMMUNITY): Payer: Self-pay

## 2021-02-06 ENCOUNTER — Other Ambulatory Visit: Payer: Self-pay | Admitting: Orthopedic Surgery

## 2021-02-06 DIAGNOSIS — M25531 Pain in right wrist: Secondary | ICD-10-CM

## 2021-02-15 ENCOUNTER — Ambulatory Visit (HOSPITAL_COMMUNITY)
Admission: RE | Admit: 2021-02-15 | Discharge: 2021-02-15 | Disposition: A | Discharge status: Home / Self Care | Payer: PRIVATE HEALTH INSURANCE | Source: Ambulatory Visit | Attending: Orthopedic Surgery | Admitting: Orthopedic Surgery

## 2021-02-15 ENCOUNTER — Other Ambulatory Visit: Payer: Self-pay

## 2021-02-15 DIAGNOSIS — M25531 Pain in right wrist: Secondary | ICD-10-CM | POA: Diagnosis present

## 2021-02-18 ENCOUNTER — Other Ambulatory Visit (HOSPITAL_COMMUNITY): Payer: Self-pay

## 2021-02-18 MED ORDER — AMPHETAMINE-DEXTROAMPHETAMINE 20 MG PO TABS: ORAL_TABLET | ORAL | 0 refills | Status: DC

## 2021-02-18 MED ORDER — AMPHETAMINE-DEXTROAMPHETAMINE 20 MG PO TABS
ORAL_TABLET | ORAL | 0 refills | Status: DC
  Filled 2021-03-12: qty 60, 30d supply, fill #0

## 2021-02-18 MED ORDER — VILAZODONE HCL 40 MG PO TABS
ORAL_TABLET | ORAL | 0 refills | Status: DC
  Filled 2021-02-18: qty 90, 90d supply, fill #0

## 2021-03-06 ENCOUNTER — Telehealth: Payer: Self-pay | Admitting: *Deleted

## 2021-03-06 NOTE — Telephone Encounter (Signed)
Pt left VM on nurse line stating she called on call early this AM- " and they put notes in my chart about arm pain I am having and need to schedule an appt with Dr Jana Hakim "  Return call number given as [phone].  This RN returned call to above- obtained message stating " VM box has not been set up ".

## 2021-03-06 NOTE — Telephone Encounter (Signed)
This RN was able to speak with pt per second call to her post her call to the on call nursing requesting an appt for ongoing arm pain.  Of note pt " graduated " from oncology follow up in August 2018.   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 Issue was temporary and resolved on it's own.  Secondly she states she " injured her wrist " while moving a broken bariactric bed at work - she was seen by orthopedist who diagnosed it as a sprain - with limited weight restrictions given. Discomfort continued as well as she noted " like a protrusion " on the right hand below the thumb - MRI was obtained with noted osteoarthritis and joint effusion.  Per discussion she states Dr Grandville Silos " did not give a reason " for the protrusion " but has just continued weight restrictions in that hand "  She did ask Dr Grandville Silos to send the films to Dr Jana Hakim.  Per her discussion she is " wanting to know what this growth is ".  This RN discussed above- including she likely has 2 issues- the hand injury and the arm discomfort - but that both may related to osteoarthritis - which is what is showing on the MRI of her hand.  This RN informed her we are glad to see her but presently she needs further work up with orthopedist for the hand issue which may be related to the injury and osteo arthritis.  This RN stated bilateral arm pain needs further work up and due to pt having [employer] insurance she should contact her primary MD for discussion and assessment and then if needed a referral can be placed to this "speciality office " if needed. Discussed  concern for appt without referral may not be covered.  Post discussion- pt stated appreciation of discussion and plan of action recommendend. She stated she will call her primary MD to schedule an appointment.  This RN informed her to call if she feels there are any issues with above plan.  No further questions at this time. Patient understands to call if further concerns arise.

## 2021-03-11 ENCOUNTER — Other Ambulatory Visit: Payer: Self-pay | Admitting: Family Medicine

## 2021-03-11 ENCOUNTER — Ambulatory Visit
Admission: RE | Admit: 2021-03-11 | Discharge: 2021-03-11 | Disposition: A | Discharge status: Home / Self Care | Payer: No Typology Code available for payment source | Source: Ambulatory Visit | Attending: Family Medicine | Admitting: Family Medicine

## 2021-03-11 DIAGNOSIS — M79601 Pain in right arm: Secondary | ICD-10-CM

## 2021-03-12 ENCOUNTER — Other Ambulatory Visit (HOSPITAL_COMMUNITY): Payer: Self-pay

## 2021-04-04 ENCOUNTER — Other Ambulatory Visit: Payer: Self-pay | Admitting: *Deleted

## 2021-04-04 DIAGNOSIS — Z170 Estrogen receptor positive status [ER+]: Secondary | ICD-10-CM

## 2021-04-04 DIAGNOSIS — C50811 Malignant neoplasm of overlapping sites of right female breast: Secondary | ICD-10-CM

## 2021-04-07 NOTE — Progress Notes (Signed)
. [name]   DOB: 23-Dec-1960  MR#: [id number]  CSN#:[id number]  Patient Care Team: Glenis Smoker, MD as PCP - General (Family Medicine) Neldon Mc, MD as Surgeon (General Surgery) Lealer Marsland, Virgie Dad, MD as Consulting Physician (Hematology and Oncology) Dillingham, Loel Lofty, DO as Attending Physician (Plastic Surgery) OTHER MD:  CHIEF COMPLAINT: history of right breast cancer (s/p bilateral mastectomies)  CURRENT TREATMENT: Observation   INTERVAL HISTORY: Kelly [name] returns today for follow-up of her estrogen receptor positive breast cancer.  She is status post bilateral mastectomies.  She "graduated" from follow up in 2018.  On 03/06/2021 she called to report arm pain.  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 Issue was temporary and resolved on it's own.   Secondly she states she injured her wrist while moving a broken bariactric bed at work - she was seen by orthopedist who diagnosed it as a sprain - with limited weight restrictions given. Discomfort continued as well as she noted " like a protrusion " on the right hand below the thumb - MRI was obtained with noted osteoarthritis and joint effusion.   Per discussion she states Dr Grandville Silos has continued weight restrictions in that hand   She requested to be seen today regarding bone pain in her arms.   REVIEW OF SYSTEMS: Kelly [name] has significant insomnia problems.  Quite aside from that she has bilateral arm pain which is both above and below the elbow.  This is worse at night.  She has right shoulder discomfort or pain and right forearm tenderness neither of which occur  in her left arm.  She does not have significant problems with swelling on either arm.  She has pain in the lower back.  She has noted that her right breast is now larger than her left breast.  Whenever she eats it feels like there is a rock in her stomach.  She does not have diarrhea or constipation problems and she has not lost any weight.  In fact she has gained some weight recently.  A detailed review of systems today was otherwise noncontributory   COVID 19 VACCINATION STATUS: refuses vaccination   BREAST CANCER HISTORY:  From the prior summary:  The patient herself noted a change in her right breast. She brought it to her primary care physician, Dr. Carron Brazen attention, and diagnostic mammography was obtained at [facility] 05/05/2012. The breasts were found to be heterogeneously dense. There was a spiculated mass posteriorly and laterally to the nipple line in the right breast, and a second mass more medially. There was an asymmetric soft tissue density in the upper outer quadrant of the left breast, without any discrete mass identified. There was a cluster of microcalcifications medially in the left breast. Ultrasound of the right breast showed a lobulated mass measuring 2.3 cm and a second mass measuring 4.5 cm. There was also a third mass measuring 1.9 cm which was felt possibly to represent a lymph node. In the right axilla there was a suspicious lymph node measuring 1.1 cm.  Biopsy of this 3 breast masses  was obtained the same day, and showed all 3 to represent invasive ductal carcinoma, grade 1. Because the 3 lesions were morphologically similar, only one prognostic panel was obtained (from the mass at "7:00"), showing it to be estrogen receptor 100% positive, progesterone receptor negative, with an MIB-1 of 12%, and no HER-2 amplification ([id number]).  Bilateral breast MRI was obtained 05/10/2012, and confirmed the 3 right breast masses in question, measuring 2.1, 2.8 (at the 7:00  position) and 2.2 cm. In addition a 1.3 cm right axillary lymph node was noted. There were no suspicious findings in the left breast. On October 30 fine-needle aspiration of the suspicious right axilla lymph node was performed, with the results being negative ([id number]).   Finally on 06/07/2012 the patient underwent right modified radical mastectomy. The three masses in the right breast measured 2.2 cm, 3 cm, and 2.3 cm ([id number]). The 2.3 cm mass was morphologically slightly different (grade 2) and a prognostic profile was obtained from this mass ("superior mid-medial"). It was 100% estrogen and 98% progesterone receptor positive. MIB-1 was 16%. There was no HER-2 amplification. A total of 19 lymph nodes were sampled, of which 9 were positive. Margins were negative, but close (1 mm).   The patient's subsequent history is as detailed below.   PAST MEDICAL HISTORY: Past Medical History:  Diagnosis Date   Abnormal Pap smear of cervix    with conization   Allergic rhinitis    Anxiety    Arthritis    Breast cancer ([place]) 05/05/12   right, ER +, PR -, Her 2 -   Depression    History of radiation therapy 07/14/2012-09/03/12   right chest wall 59.4 gray   Osteoarthritis    Scoliosis    Followed by Dr. Harlow Asa    Sleep apnea    cpap-has not been using-doing well without it  migraines;   PAST SURGICAL HISTORY: Past Surgical History:  Procedure Laterality Date   BREAST RECONSTRUCTION WITH PLACEMENT OF TISSUE EXPANDER AND FLEX HD (ACELLULAR HYDRATED DERMIS) Left 06/06/2013   Procedure: PLACEMENT OF LEFT BREAST TISSUE EXPANDER AND FLEX HD ;  Surgeon: Theodoro Kos, DO;  Location: [facility];  Service: Plastics;  Laterality: Left;   BREAST SURGERY     several biopsies, needle aspiration   CERVICAL CONIZATION W/BX     CESAREAN SECTION     x3   COSMETIC SURGERY  04/19/14   Dr. Migdalia Dk, mini tummy tuck, smoothed out right breast, left breast lift   DILATATION & CURRETTAGE/HYSTEROSCOPY WITH RESECTOCOPE  N/A 06/12/2014   Procedure: DILATATION & CURETTAGE/HYSTEROSCOPY ;  Surgeon: [facility] Speller, MD;  Location: [facility];  Service: Gynecology;  Laterality: N/A;   JOINT REPLACEMENT     Rt and Lt thumbs   LATISSIMUS FLAP TO BREAST Right 06/06/2013   Procedure: RIGHT LATISSIMUS MYOCUTANEOUS FLAP WITH PLACEMENT OF TISSUE EXPANDER IN RIGHT BREAST ;  Surgeon: Theodoro Kos, DO;  Location: [facility];  Service: Plastics;  Laterality: Right;   MASTECTOMY MODIFIED RADICAL  06/07/2012   Procedure: MASTECTOMY MODIFIED RADICAL;  Surgeon: [facility] Lasso, MD;  Location: [facility];  Service: General;  Laterality: Right;   MASTECTOMY W/ SENTINEL NODE BIOPSY  06/07/2012   Procedure: MASTECTOMY WITH SENTINEL LYMPH NODE BIOPSY;  Surgeon: [place]wood Lasso, MD;  Location: [facility];  Service: General;  Laterality: Right;  right total mastectomy and sentinel node   REMOVAL OF BILATERAL TISSUE EXPANDERS WITH PLACEMENT OF BILATERAL BREAST IMPLANTS Bilateral 11/10/2013   Procedure: REMOVAL  OF BILATERAL TISSUE EXPANDERS WITH PLACEMENT OF SILICONE BREAST IMPLANTS WITH BILATERAL CAPSULOTOMIES;  Surgeon: Theodoro Kos, DO;  Location: [facility];  Service: Plastics;  Laterality: Bilateral;   SIMPLE MASTECTOMY WITH AXILLARY SENTINEL NODE BIOPSY Left 09/30/2012   Procedure: SIMPLE MASTECTOMY;  Surgeon: Haywood Lasso, MD;  Location: [facility];  Service: General;  Laterality: Left;  left total mastectomy   TUBAL LIGATION      FAMILY HISTORY Family History  Problem Relation Age of Onset   Breast cancer Mother 63   Lymphoma Mother 40       non-hodgkins lymphoma in her epiglottis   Cancer Mother        non-hodkins lymphoma, breast   Hypertension Mother    Heart disease Mother    Deep vein thrombosis Mother    Thyroid disease Mother    Osteoporosis Mother    Stroke Maternal Grandfather    Bipolar disorder Brother    Gout Brother    Breast cancer Other        MGM's sister; dx <50   Breast  cancer Other 61       MGF's sister   Brain cancer Other        MGM's sister; dx <50   Leukemia Other        mother's maternal cousin; dx under 25  the patient's mother, Kelly [name], is also my patient and has a history of breast cancer. The patient's father was adopted. Kelly [name] has 2 brothers, no sisters. There is no history of ovarian cancer in the family.   GYNECOLOGIC HISTORY: She had menarche age 88, first live birth age 51. She is GX P3. 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  Her husband Kelly [name] retired from [employer] at age 89.  There son Kelly [name], 26, also works in the emergency room as an EMT and is hoping to become a Conservation officer, historic buildings.  There is on [place] 27 lives in [place] and is working in Engineer, site.  The patient's youngest son, 23 years old, is living in [place].  There are other issues we discussed today which are not documented here but which are causing the patient's significant stress. ADVANCED DIRECTIVES: In the absence of any documentation to the contrary, the patient's spouse is their HCPOA.    HEALTH MAINTENANCE: Social History   Tobacco Use   Smoking status: Never   Smokeless tobacco: Never  Vaping Use   Vaping Use: Never used  Substance Use Topics   Alcohol use: Not Currently    Alcohol/week: 0.0 standard drinks    Comment: socially   Drug use: No    Colonoscopy: 2012  PAP: unsure of date  Bone density: 08/11/2012 at [facility], osteopenia  Lipid panel: August 2013, Dr. Inis Sizer  Allergies  Allergen Reactions   Doxycycline Rash    Current Outpatient Medications  Medication Sig Dispense Refill   amphetamine-dextroamphetamine (ADDERALL) 20 MG tablet Take 1 tablet by mouth daily.   0   Potassium 99 MG TABS Take 1 tablet by mouth daily.     Vilazodone HCl (VIIBRYD) 40 MG TABS TAKE 1 TABLET BY MOUTH ONCE A DAY FOR DEPRESSION 90 tablet 0   ALPRAZolam (XANAX) 0.5  MG tablet Take 1 tablet by mouth daily as needed for anxiety.  (Patient not taking: Reported on 04/08/2021)  1   amphetamine-dextroamphetamine (ADDERALL) 20 MG tablet TAKE 1 TABLET BY  MOUTH TWO TIMES DAILY FOR ADHD 30 tablet 0   [START ON 04/29/2021] amphetamine-dextroamphetamine (ADDERALL) 20 MG tablet Take 1 tablet twice daily for ADHD  **04/29/21** 60 tablet 0   amphetamine-dextroamphetamine (ADDERALL) 20 MG tablet Take 1 tablet twice daily for ADHD  **03/30/21 60 tablet 0   Vilazodone HCl (VIIBRYD) 10 MG TABS      No current facility-administered medications for this visit.    OBJECTIVE: Middle-aged white woman who appears younger than stated age  56:   04/08/21 1645  BP: (!) 119/58  Pulse: (!) 108  Temp: 97.7 F (36.5 C)      Body mass index is 21.87 kg/m.    ECOG FS: 1 Filed Weights   04/08/21 1645  Weight: 131 lb 6.4 oz (59.6 kg)    Sclerae unicteric, EOMs intact Wearing a mask No cervical or supraclavicular adenopathy Lungs no rales or rhonchi Heart regular rate and rhythm Abd soft, nontender, positive bowel sounds MSK mild lumbar spinal tenderness, no upper extremity lymphedema, slightly diminished range of function right upper extremity Neuro: nonfocal, well oriented, appropriate affect Breasts: The right breast is status postmastectomy with TRAM as well as implant reconstruction.  There are significant telangiectasias secondary to radiation.  The left breast is status post mastectomy with implant reconstruction.  There is no evidence of disease recurrence on either side and both axillae are benign.   LAB RESULTS: Lab Results  Component Value Date   WBC 5.2 04/08/2021   NEUTROABS 3.9 04/08/2021   HGB 14.5 04/08/2021   HCT 42.6 04/08/2021   MCV 90.3 04/08/2021   PLT 272 04/08/2021      Chemistry      Component Value Date/Time   NA 142 04/08/2021 1605   NA 142 03/02/2017 1112   K 4.0 04/08/2021 1605   K 4.3 03/02/2017 1112   CL 105 04/08/2021 1605   CL  102 12/27/2012 0935   CO2 25 04/08/2021 1605   CO2 32 (H) 03/02/2017 1112   BUN 14 04/08/2021 1605   BUN 13.9 03/02/2017 1112   CREATININE 1.01 (H) 04/08/2021 1605   CREATININE 0.8 03/02/2017 1112      Component Value Date/Time   CALCIUM 9.5 04/08/2021 1605   CALCIUM 10.3 03/02/2017 1112   ALKPHOS 101 04/08/2021 1605   ALKPHOS 135 03/02/2017 1112   AST 24 04/08/2021 1605   AST 25 03/02/2017 1112   ALT 20 04/08/2021 1605   ALT 23 03/02/2017 1112   BILITOT 1.5 (H) 04/08/2021 1605   BILITOT 0.75 03/02/2017 1112      Lab Results  Component Value Date   LABCA2 20 03/10/2016    STUDIES: DG Cervical Spine 2 or 3 views  Result Date: 03/11/2021 CLINICAL DATA:  Pain in right arm. No injury. History of breast cancer. EXAM: CERVICAL SPINE - 2-3 VIEW COMPARISON:  None. FINDINGS: Mild reversal of normal lordosis. No other malalignment. Mild degenerative changes at C4-5, C5-6, and C6-7. The pre odontoid space and prevertebral soft tissues are normal. No fractures. Lateral masses of C1 align with C2. Odontoid process is unremarkable. The lung apices are normal. IMPRESSION: Mild degenerative changes as above.  No other abnormalities. Electronically Signed   By: Dorise Bullion III M.D.   On: 03/11/2021 11:13     ASSESSMENT: 60 y.o. BRCA negative [place] woman status post right modified radical mastectomy 06/07/2012 for a multifocal invasive ductal carcinoma, the largest of 3 masses measuring 3.0 cm, grade 1; a second mass measuring 2.3 cm, grade 2;  and a third mass measuring 2.2 cm, grade 1. The 2 larger masses were both 100% estrogen receptor positive, the grade 2 mass being also progesterone receptor positive at 98%, with an MIB-1 of 16% (the larger mass was progesterone receptor negative, with an Mib-1 of 12%). Both showed no HER-2 amplification. 9 of 19 axillary lymph nodes sampled were involved. In summary:  (1) status post right modified radical mastectomy November 2013 for a mpT2 pN2a,  stage IIIA invasive ductal carcinoma, grade 1-2, estrogen receptor positive, HER-2 not amplified, with an MIB-1 of 12-16%  (2) the patient opted to forego adjuvant chemotherapy  (3) adjuvant radiation therapy completed 09/03/2012   (4)  started on Tamoxifen, but she "never really took this" and declined further anti-estrogen therapy.    (5) left simple mastectomy 09/30/2012, with benign pathology; complicated by an infected seroma MRSA infection.  (6) patient is s/p bilateral delayed reconstruction with right latissimus dorsi myocutaneous flap with tissue expander placement and left tissue expander placement in Nov. 2014. Exchange surgery was done with placement of bilateral silicone implants 1/58/3094.  Left - Mentor Smooth Round High Profile Gel 325cc. Ref #[id number]. Serial  Right - Mentor Shape Medium Heigh, High Profile Gel 300cc. Ref #[id number].   (7) history of major depressive disorder, status post transcranial magnetic simulation treatment completed December 2015   PLAN:  Lateshia is now 9 years out from definitive surgery for her right breast cancer, with no clear evidence of disease recurrence.  She has a multiplicity of symptoms most of which Kelly think can be explained separately.  The fact that her right breast is growing while her left breast is not is simply due to the fact that there is breast tissue on the right side but not on the left.  As she gains weight that tissue also increases.  She likely has mild bursitis or possibly some rotator cuff issues regarding the right shoulder and Kelly have suggested that she do no pushing or lifting weights above the shoulder level.  She has some changes in the wrist on the right where she had an injury.  These are chronic changes due to injury and require no further evaluation.  After going through these explanations, Kelly am still left with the fact that she has bilateral arm pain with no obvious reason for it.  A simple neck film obtained recently  by her primary care physician showed mild degenerative disease.  Aside from that she does have low back pain which is very common and she has ongoing abdominal distress which could be due to the family stress she is experiencing as well as work stress.  In short Kelly can offer some ad hoc explanation for most of the symptoms that Kelly [name] is experiencing but the bottom line is that she is extremely concerned that her breast cancer is back.  Kelly wish Kelly could say with confidence that it is not but it is true that she had no systemic treatment for her breast cancer and really is at high risk for systemic recurrence.  Accordingly we are going to proceed with restaging studies which will include CTs of the neck chest abdomen and pelvis.  She understands that if Kelly find anything abnormal it may require biopsy and that biopsies can lead to significant complications, and short that there is the risk of false positives.  At this point there is no question in her mind that she wishes to proceed to these tests.  Kelly am hopeful they will tell  us that she continues in remission.  Otherwise we will proceed to biopsy of any suspicious areas  Total encounter time 65 minutes.Sarajane Jews C. Clemie General MD Oncology and Hematology [facility] [place], [place] [address] Tel. [phone]  Joylene Igo [phone]   Kelly, Wilburn Mylar, am acting as scribe for Dr. Sarajane Jews C. Jaeveon [place].  Kelly, Lurline Del MD, have reviewed the above documentation for accuracy and completeness, and Kelly agree with the above.    *Total Encounter Time as defined by the Centers for Medicare and Medicaid Services includes, in addition to the face-to-face time of a patient visit (documented in the note above) non-face-to-face time: obtaining and reviewing outside history, ordering and reviewing medications, tests or procedures, care coordination (communications with other health care professionals or caregivers) and documentation in  the medical record.

## 2021-04-08 ENCOUNTER — Other Ambulatory Visit: Payer: Self-pay | Admitting: *Deleted

## 2021-04-08 ENCOUNTER — Other Ambulatory Visit: Payer: Self-pay

## 2021-04-08 ENCOUNTER — Inpatient Hospital Stay: Payer: No Typology Code available for payment source | Attending: Oncology | Admitting: Oncology

## 2021-04-08 ENCOUNTER — Telehealth: Payer: Self-pay | Admitting: *Deleted

## 2021-04-08 ENCOUNTER — Inpatient Hospital Stay: Payer: No Typology Code available for payment source

## 2021-04-08 VITALS — BP 119/58 | HR 108 | Temp 97.7°F | Wt 131.4 lb

## 2021-04-08 DIAGNOSIS — Z9013 Acquired absence of bilateral breasts and nipples: Secondary | ICD-10-CM | POA: Diagnosis not present

## 2021-04-08 DIAGNOSIS — C50811 Malignant neoplasm of overlapping sites of right female breast: Secondary | ICD-10-CM

## 2021-04-08 DIAGNOSIS — Z8262 Family history of osteoporosis: Secondary | ICD-10-CM | POA: Diagnosis not present

## 2021-04-08 DIAGNOSIS — Z881 Allergy status to other antibiotic agents status: Secondary | ICD-10-CM | POA: Diagnosis not present

## 2021-04-08 DIAGNOSIS — C50911 Malignant neoplasm of unspecified site of right female breast: Secondary | ICD-10-CM

## 2021-04-08 DIAGNOSIS — M47812 Spondylosis without myelopathy or radiculopathy, cervical region: Secondary | ICD-10-CM | POA: Diagnosis not present

## 2021-04-08 DIAGNOSIS — Z8249 Family history of ischemic heart disease and other diseases of the circulatory system: Secondary | ICD-10-CM | POA: Diagnosis not present

## 2021-04-08 DIAGNOSIS — Z8614 Personal history of Methicillin resistant Staphylococcus aureus infection: Secondary | ICD-10-CM | POA: Diagnosis not present

## 2021-04-08 DIAGNOSIS — M79602 Pain in left arm: Secondary | ICD-10-CM | POA: Diagnosis not present

## 2021-04-08 DIAGNOSIS — M79601 Pain in right arm: Secondary | ICD-10-CM | POA: Diagnosis not present

## 2021-04-08 DIAGNOSIS — M2540 Effusion, unspecified joint: Secondary | ICD-10-CM | POA: Diagnosis not present

## 2021-04-08 DIAGNOSIS — Z923 Personal history of irradiation: Secondary | ICD-10-CM | POA: Diagnosis not present

## 2021-04-08 DIAGNOSIS — M898X9 Other specified disorders of bone, unspecified site: Secondary | ICD-10-CM

## 2021-04-08 DIAGNOSIS — Z8349 Family history of other endocrine, nutritional and metabolic diseases: Secondary | ICD-10-CM | POA: Diagnosis not present

## 2021-04-08 DIAGNOSIS — Z823 Family history of stroke: Secondary | ICD-10-CM | POA: Diagnosis not present

## 2021-04-08 DIAGNOSIS — Z818 Family history of other mental and behavioral disorders: Secondary | ICD-10-CM | POA: Diagnosis not present

## 2021-04-08 DIAGNOSIS — Z807 Family history of other malignant neoplasms of lymphoid, hematopoietic and related tissues: Secondary | ICD-10-CM | POA: Insufficient documentation

## 2021-04-08 DIAGNOSIS — Z79810 Long term (current) use of selective estrogen receptor modulators (SERMs): Secondary | ICD-10-CM | POA: Insufficient documentation

## 2021-04-08 DIAGNOSIS — Z806 Family history of leukemia: Secondary | ICD-10-CM | POA: Diagnosis not present

## 2021-04-08 DIAGNOSIS — Z803 Family history of malignant neoplasm of breast: Secondary | ICD-10-CM | POA: Insufficient documentation

## 2021-04-08 DIAGNOSIS — Z808 Family history of malignant neoplasm of other organs or systems: Secondary | ICD-10-CM | POA: Insufficient documentation

## 2021-04-08 DIAGNOSIS — Z170 Estrogen receptor positive status [ER+]: Secondary | ICD-10-CM

## 2021-04-08 DIAGNOSIS — Z8269 Family history of other diseases of the musculoskeletal system and connective tissue: Secondary | ICD-10-CM | POA: Diagnosis not present

## 2021-04-08 LAB — CBC WITH DIFFERENTIAL (CANCER CENTER ONLY)
Abs Immature Granulocytes: 0.01 10*3/uL (ref 0.00–0.07)
Basophils Absolute: 0 10*3/uL (ref 0.0–0.1)
Basophils Relative: 0 %
Eosinophils Absolute: 0.1 10*3/uL (ref 0.0–0.5)
Eosinophils Relative: 1 %
HCT: 42.6 % (ref 36.0–46.0)
Hemoglobin: 14.5 g/dL (ref 12.0–15.0)
Immature Granulocytes: 0 %
Lymphocytes Relative: 18 %
Lymphs Abs: 1 10*3/uL (ref 0.7–4.0)
MCH: 30.7 pg (ref 26.0–34.0)
MCHC: 34 g/dL (ref 30.0–36.0)
MCV: 90.3 fL (ref 80.0–100.0)
Monocytes Absolute: 0.3 10*3/uL (ref 0.1–1.0)
Monocytes Relative: 6 %
Neutro Abs: 3.9 10*3/uL (ref 1.7–7.7)
Neutrophils Relative %: 75 %
Platelet Count: 272 10*3/uL (ref 150–400)
RBC: 4.72 MIL/uL (ref 3.87–5.11)
RDW: 13 % (ref 11.5–15.5)
WBC Count: 5.2 10*3/uL (ref 4.0–10.5)
nRBC: 0 % (ref 0.0–0.2)

## 2021-04-08 LAB — CMP (CANCER CENTER ONLY)
ALT: 20 U/L (ref 0–44)
AST: 24 U/L (ref 15–41)
Albumin: 4 g/dL (ref 3.5–5.0)
Alkaline Phosphatase: 101 U/L (ref 38–126)
Anion gap: 12 (ref 5–15)
BUN: 14 mg/dL (ref 6–20)
CO2: 25 mmol/L (ref 22–32)
Calcium: 9.5 mg/dL (ref 8.9–10.3)
Chloride: 105 mmol/L (ref 98–111)
Creatinine: 1.01 mg/dL — ABNORMAL HIGH (ref 0.44–1.00)
GFR, Estimated: >60 mL/min (ref >60)
Glucose, Bld: 130 mg/dL — ABNORMAL HIGH (ref 70–99)
Potassium: 4 mmol/L (ref 3.5–5.1)
Sodium: 142 mmol/L (ref 135–145)
Total Bilirubin: 1.5 mg/dL — ABNORMAL HIGH (ref 0.3–1.2)
Total Protein: 7.2 g/dL (ref 6.5–8.1)

## 2021-04-09 LAB — CANCER ANTIGEN 27.29: CA 27.29: 19.1 U/mL (ref 0.0–38.6)

## 2021-04-15 ENCOUNTER — Encounter: Payer: Self-pay | Admitting: Oncology

## 2021-04-22 ENCOUNTER — Ambulatory Visit (HOSPITAL_COMMUNITY)
Admission: RE | Admit: 2021-04-22 | Discharge: 2021-04-22 | Disposition: A | Discharge status: Home / Self Care | Payer: No Typology Code available for payment source | Source: Ambulatory Visit | Attending: Oncology | Admitting: Oncology

## 2021-04-22 DIAGNOSIS — Z170 Estrogen receptor positive status [ER+]: Secondary | ICD-10-CM | POA: Insufficient documentation

## 2021-04-22 DIAGNOSIS — C50811 Malignant neoplasm of overlapping sites of right female breast: Secondary | ICD-10-CM | POA: Diagnosis present

## 2021-04-22 DIAGNOSIS — C50911 Malignant neoplasm of unspecified site of right female breast: Secondary | ICD-10-CM | POA: Diagnosis present

## 2021-04-22 MED ORDER — IOHEXOL 350 MG/ML SOLN
80.0000 mL | Freq: Once | INTRAVENOUS | Status: AC | PRN
  Administered 2021-04-22: 80 mL via INTRAVENOUS

## 2021-04-28 NOTE — Progress Notes (Signed)
. Kelly [name]   DOB: [birth year]  MR#: [id number]  CSN#:[id number]  Patient Care Team: Kelly Smoker, MD as PCP - General (Family Medicine) Kelly Mc, MD as Surgeon (General Surgery) Kelly [name], Kelly Dad, MD as Consulting Physician (Hematology and Oncology) Kelly [name], Kelly Lofty, DO as Attending Physician (Plastic Surgery) OTHER MD:  CHIEF COMPLAINT: history of right breast cancer (s/p bilateral mastectomies)  CURRENT TREATMENT: Observation   INTERVAL HISTORY: Kelly [name] returns today for follow-up of her estrogen receptor positive breast cancer.  She is status post bilateral mastectomies.  She "graduated" from follow up in 2018.  More recently however she had developed a variety of symptoms, described in my note from 04/08/2021 leading to the need for restaging.  Accordingly she underwent CT scans of the neck, chest, abdomen, and pelvis on 04/22/2021. All were negative for evidence of recurrent or metastatic disease.  Her CA 27.29 is also not elevated. Lab Results  Component Value Date   CA2729 19.1 04/08/2021   CA2729 26.8 10/30/2017   CA2729 25.5 03/02/2017   CA2729 23.4 03/10/2016    REVIEW OF SYSTEMS: Kelly [name] feels much better now that she has been reassured.  She is working on [employer] loss program and eating right program.  She notes that she had not had anything to eat for 8 hours at least when her lab work was drawn and she had a glucose of 130.  She plans to follow-up with her primary care physician regarding that.  Otherwise a detailed review of systems today was stable   COVID 19 VACCINATION STATUS: refuses vaccination   BREAST CANCER HISTORY:  From the prior summary:  The patient herself noted a change in her right breast. She brought it to her primary care physician, Dr. Carron Brazen attention, and diagnostic mammography was obtained at [facility] 05/05/2012. The breasts were found to be heterogeneously dense. There was a spiculated mass posteriorly and  laterally to the nipple line in the right breast, and a second mass more medially. There was an asymmetric soft tissue density in the upper outer quadrant of the left breast, without any discrete mass identified. There was a cluster of microcalcifications medially in the left breast. Ultrasound of the right breast showed a lobulated mass measuring 2.3 cm and a second mass measuring 4.5 cm. There was also a third mass measuring 1.9 cm which was felt possibly to represent a lymph node. In the right axilla there was a suspicious lymph node measuring 1.1 cm.  Biopsy of this 3 breast masses was obtained the same day, and showed all 3 to represent invasive ductal carcinoma, grade 1. Because the 3 lesions were morphologically similar, only one prognostic panel was obtained (from the mass at "7:00"), showing it to be estrogen receptor 100% positive, progesterone receptor negative, with an MIB-1 of 12%, and no HER-2 amplification ([id number]).  Bilateral breast MRI was obtained 05/10/2012, and confirmed the 3 right breast masses in question, measuring 2.1, 2.8 (at the 7:00 position) and 2.2 cm. In addition a 1.3 cm right axillary lymph node was noted. There were no suspicious findings in the left breast. On October 30 fine-needle aspiration of the suspicious right axilla lymph node was performed, with the results being negative ([id number]).   Finally on 06/07/2012 the patient underwent right modified radical mastectomy. The three masses in the right breast measured 2.2 cm, 3 cm, and 2.3 cm ([id number]). The 2.3 cm mass was morphologically slightly different (grade 2) and a prognostic profile was obtained  from this mass ("superior mid-medial"). It was 100% estrogen and 98% progesterone receptor positive. MIB-1 was 16%. There was no HER-2 amplification. A total of 19 lymph nodes were sampled, of which 9 were positive. Margins were negative, but close (1 mm).   The patient's subsequent history is as detailed  below.   PAST MEDICAL HISTORY: Past Medical History:  Diagnosis Date   Abnormal Pap smear of cervix    with conization   Allergic rhinitis    Anxiety    Arthritis    Breast cancer ([place]) 05/05/12   right, ER +, PR -, Her 2 -   Depression    History of radiation therapy 07/14/2012-09/03/12   right chest wall 59.4 gray   Osteoarthritis    Scoliosis    Followed by Dr. Harlow Asa    Sleep apnea    cpap-has not been using-doing well without it  migraines;   PAST SURGICAL HISTORY: Past Surgical History:  Procedure Laterality Date   BREAST RECONSTRUCTION WITH PLACEMENT OF TISSUE EXPANDER AND FLEX HD (ACELLULAR HYDRATED DERMIS) Left 06/06/2013   Procedure: PLACEMENT OF LEFT BREAST TISSUE EXPANDER AND FLEX HD ;  Surgeon: Theodoro Kos, DO;  Location: [facility];  Service: Plastics;  Laterality: Left;   BREAST SURGERY     several biopsies, needle aspiration   CERVICAL CONIZATION W/BX     CESAREAN SECTION     x3   COSMETIC SURGERY  04/19/14   Dr. Migdalia Dk, mini tummy tuck, smoothed out right breast, left breast lift   DILATATION & CURRETTAGE/HYSTEROSCOPY WITH RESECTOCOPE N/A 06/12/2014   Procedure: DILATATION & CURETTAGE/HYSTEROSCOPY ;  Surgeon: Lyman Speller, MD;  Location: [facility];  Service: Gynecology;  Laterality: N/A;   JOINT REPLACEMENT     Rt and Lt thumbs   LATISSIMUS FLAP TO BREAST Right 06/06/2013   Procedure: RIGHT LATISSIMUS MYOCUTANEOUS FLAP WITH PLACEMENT OF TISSUE EXPANDER IN RIGHT BREAST ;  Surgeon: Theodoro Kos, DO;  Location: [facility];  Service: Plastics;  Laterality: Right;   MASTECTOMY MODIFIED RADICAL  06/07/2012   Procedure: MASTECTOMY MODIFIED RADICAL;  Surgeon: Haywood Lasso, MD;  Location: [facility];  Service: General;  Laterality: Right;   MASTECTOMY W/ SENTINEL NODE BIOPSY  06/07/2012   Procedure: MASTECTOMY WITH SENTINEL LYMPH NODE BIOPSY;  Surgeon: Haywood Lasso, MD;  Location: [facility];  Service: General;  Laterality: Right;  right total mastectomy and sentinel  node   REMOVAL OF BILATERAL TISSUE EXPANDERS WITH PLACEMENT OF BILATERAL BREAST IMPLANTS Bilateral 11/10/2013   Procedure: REMOVAL OF BILATERAL TISSUE EXPANDERS WITH PLACEMENT OF SILICONE BREAST IMPLANTS WITH BILATERAL CAPSULOTOMIES;  Surgeon: Theodoro Kos, DO;  Location: [facility];  Service: Plastics;  Laterality: Bilateral;   SIMPLE MASTECTOMY WITH AXILLARY SENTINEL NODE BIOPSY Left 09/30/2012   Procedure: SIMPLE MASTECTOMY;  Surgeon: Haywood Lasso, MD;  Location: [facility];  Service: General;  Laterality: Left;  left total mastectomy   TUBAL LIGATION      FAMILY HISTORY Family History  Problem Relation Age of Onset   Breast cancer Mother 94   Lymphoma Mother 52       non-hodgkins lymphoma in her epiglottis   Cancer Mother        non-hodkins lymphoma, breast   Hypertension Mother    Heart disease Mother    Deep vein thrombosis Mother    Thyroid disease Mother    Osteoporosis Mother    Stroke Maternal Grandfather    Bipolar disorder Brother    Gout Brother  Breast cancer Other        MGM's sister; dx <50   Breast cancer Other 94       MGF's sister   Brain cancer Other        MGM's sister; dx <50   Leukemia Other        mother's maternal cousin; dx under 93  the patient's mother, Daylee Delahoz, is also my patient and has a history of breast cancer. The patient's father was adopted. Chalsey has 2 brothers, no sisters. There is no history of ovarian cancer in the family.   GYNECOLOGIC HISTORY: She had menarche age 32, first live birth age 70. She is GX P3. 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  Her husband Elta Guadeloupe retired from [employer] at age 19.  There son Merrily Pew, 34, also works in the emergency room as an EMT and is hoping to become a Conservation officer, historic buildings.  There is on [place] 26 lives in [place] and is working in Engineer, site.  The patient's  youngest son, 12 years old, is living in [place].  There are other issues we discussed today which are not documented here but which are causing the patient's significant stress.  ADVANCED DIRECTIVES: In the absence of any documentation to the contrary, the patient's spouse is their HCPOA.    HEALTH MAINTENANCE: Social History   Tobacco Use   Smoking status: Never   Smokeless tobacco: Never  Vaping Use   Vaping Use: Never used  Substance Use Topics   Alcohol use: Not Currently    Alcohol/week: 0.0 standard drinks    Comment: socially   Drug use: No    Colonoscopy: 2012  PAP: unsure of date  Bone density: 08/11/2012 at [facility], osteopenia  Lipid panel: August 2013, Dr. Inis Sizer  Allergies  Allergen Reactions   Doxycycline Rash    Current Outpatient Medications  Medication Sig Dispense Refill   ALPRAZolam (XANAX) 0.5 MG tablet Take 1 tablet by mouth daily as needed for anxiety.  (Patient not taking: Reported on 04/08/2021)  1   amphetamine-dextroamphetamine (ADDERALL) 20 MG tablet Take 1 tablet by mouth daily.   0   amphetamine-dextroamphetamine (ADDERALL) 20 MG tablet TAKE 1 TABLET BY MOUTH TWO TIMES DAILY FOR ADHD 30 tablet 0   amphetamine-dextroamphetamine (ADDERALL) 20 MG tablet Take 1 tablet twice daily for ADHD  **04/29/21** 60 tablet 0   amphetamine-dextroamphetamine (ADDERALL) 20 MG tablet Take 1 tablet twice daily for ADHD  **03/30/21 60 tablet 0   Potassium 99 MG TABS Take 1 tablet by mouth daily.     Vilazodone HCl (VIIBRYD) 10 MG TABS      Vilazodone HCl (VIIBRYD) 40 MG TABS TAKE 1 TABLET BY MOUTH ONCE A DAY FOR DEPRESSION 90 tablet 0   No current facility-administered medications for this visit.    OBJECTIVE: Middle-aged white woman who appears younger than stated age  75:   04/29/21 1636  BP: (!) 104/53  Pulse: 78  Resp: 18  Temp: (!) 97.5 F (36.4 C)  SpO2: 95%      Body mass index is 22.53 kg/m.    ECOG FS: 1 Filed Weights   04/29/21 1636  Weight:  135 lb 6 oz (61.4 kg)   Sclerae unicteric, EOMs intact Wearing a mask No cervical or supraclavicular adenopathy Lungs no rales or rhonchi Heart regular rate and rhythm Abd soft, nontender, positive bowel sounds MSK no focal  spinal tenderness, no upper extremity lymphedema Neuro: nonfocal, well oriented, appropriate affect Breasts: The right breast is status postmastectomy.  It has undergone TRAM as well as implant reconstruction.  The telangiectasias secondary to radiation are unchanged.  The left breast is status postmastectomy with implant reconstruction.  There is no evidence of local recurrence of disease.  Both axillae are benign   LAB RESULTS: Lab Results  Component Value Date   WBC 5.2 04/08/2021   NEUTROABS 3.9 04/08/2021   HGB 14.5 04/08/2021   HCT 42.6 04/08/2021   MCV 90.3 04/08/2021   PLT 272 04/08/2021      Chemistry      Component Value Date/Time   NA 142 04/08/2021 1605   NA 142 03/02/2017 1112   K 4.0 04/08/2021 1605   K 4.3 03/02/2017 1112   CL 105 04/08/2021 1605   CL 102 12/27/2012 0935   CO2 25 04/08/2021 1605   CO2 32 (H) 03/02/2017 1112   BUN 14 04/08/2021 1605   BUN 13.9 03/02/2017 1112   CREATININE 1.01 (H) 04/08/2021 1605   CREATININE 0.8 03/02/2017 1112      Component Value Date/Time   CALCIUM 9.5 04/08/2021 1605   CALCIUM 10.3 03/02/2017 1112   ALKPHOS 101 04/08/2021 1605   ALKPHOS 135 03/02/2017 1112   AST 24 04/08/2021 1605   AST 25 03/02/2017 1112   ALT 20 04/08/2021 1605   ALT 23 03/02/2017 1112   BILITOT 1.5 (H) 04/08/2021 1605   BILITOT 0.75 03/02/2017 1112      Lab Results  Component Value Date   LABCA2 20 03/10/2016    STUDIES: CT Soft Tissue Neck W Contrast  Result Date: 04/23/2021 CLINICAL DATA:  Metastatic disease evaluation. History of breast cancer. EXAM: CT NECK WITH CONTRAST TECHNIQUE: Multidetector CT imaging of the neck was performed using the standard protocol following the bolus administration of intravenous  contrast. CONTRAST:  10m OMNIPAQUE IOHEXOL 350 MG/ML SOLN COMPARISON:  CT neck 08/23/2013 FINDINGS: Pharynx and larynx: Normal. No mass or swelling. Salivary glands: No inflammation, mass, or stone. Thyroid: Normal Lymph nodes: No enlarged lymph nodes in the neck. Vascular: Normal vascular enhancement. Limited intracranial: Negative Visualized orbits: Negative Mastoids and visualized paranasal sinuses: Paranasal sinuses clear. Mastoid clear. Skeleton: No skeletal lesion identified. Upper chest: CT chest reported separately from today Other: None IMPRESSION: Negative CT neck.  Negative for mass or adenopathy. Electronically Signed   By: CFranchot GalloM.D.   On: 04/23/2021 10:43   CT Chest W Contrast  Result Date: 04/23/2021 CLINICAL DATA:  Breast cancer restaging, status post mastectomy and reconstruction, XRT EXAM: CT CHEST, ABDOMEN, AND PELVIS WITH CONTRAST TECHNIQUE: Multidetector CT imaging of the chest, abdomen and pelvis was performed following the standard protocol during bolus administration of intravenous contrast. CONTRAST:  868mOMNIPAQUE IOHEXOL 350 MG/ML SOLN, additional oral enteric contrast COMPARISON:  CT abdomen pelvis, 10/15/2017, PET-CT, 12/27/2012 FINDINGS: CT CHEST FINDINGS Cardiovascular: No significant vascular findings. Normal heart size. No pericardial effusion. Mediastinum/Nodes: No enlarged mediastinal, hilar, or axillary lymph nodes. Surgical clips in the right axilla. Thyroid gland, trachea, and esophagus demonstrate no significant findings. Lungs/Pleura: Minimal subpleural radiation fibrosis of the right upper and right middle lobes. No pleural effusion or pneumothorax. Musculoskeletal: No chest wall mass or suspicious bone lesions identified. Status post bilateral mastectomy and implant reconstruction CT ABDOMEN PELVIS FINDINGS Hepatobiliary: No solid liver abnormality is seen. No gallstones, gallbladder wall thickening, or biliary dilatation. Pancreas: Unremarkable. No pancreatic  ductal dilatation or surrounding inflammatory changes.  Spleen: Normal in size without significant abnormality. Adrenals/Urinary Tract: Adrenal glands are unremarkable. Kidneys are normal, without renal calculi, solid lesion, or hydronephrosis. Bladder is unremarkable. Stomach/Bowel: Stomach is within normal limits. Appendix appears normal. No evidence of bowel wall thickening, distention, or inflammatory changes. Vascular/Lymphatic: No significant vascular findings are present. No enlarged abdominal or pelvic lymph nodes. Reproductive: No mass or other abnormality. Other: No abdominal wall hernia or abnormality. No abdominopelvic ascites. Musculoskeletal: No acute or significant osseous findings. IMPRESSION: 1. Status post bilateral mastectomy and implant reconstruction. 2. No evidence of recurrent or metastatic disease in the chest, abdomen, or pelvis. Electronically Signed   By: Eddie Candle M.D.   On: 04/23/2021 11:30   CT Abdomen Pelvis W Contrast  Result Date: 04/23/2021 CLINICAL DATA:  Breast cancer restaging, status post mastectomy and reconstruction, XRT EXAM: CT CHEST, ABDOMEN, AND PELVIS WITH CONTRAST TECHNIQUE: Multidetector CT imaging of the chest, abdomen and pelvis was performed following the standard protocol during bolus administration of intravenous contrast. CONTRAST:  79m OMNIPAQUE IOHEXOL 350 MG/ML SOLN, additional oral enteric contrast COMPARISON:  CT abdomen pelvis, 10/15/2017, PET-CT, 12/27/2012 FINDINGS: CT CHEST FINDINGS Cardiovascular: No significant vascular findings. Normal heart size. No pericardial effusion. Mediastinum/Nodes: No enlarged mediastinal, hilar, or axillary lymph nodes. Surgical clips in the right axilla. Thyroid gland, trachea, and esophagus demonstrate no significant findings. Lungs/Pleura: Minimal subpleural radiation fibrosis of the right upper and right middle lobes. No pleural effusion or pneumothorax. Musculoskeletal: No chest wall mass or suspicious bone  lesions identified. Status post bilateral mastectomy and implant reconstruction CT ABDOMEN PELVIS FINDINGS Hepatobiliary: No solid liver abnormality is seen. No gallstones, gallbladder wall thickening, or biliary dilatation. Pancreas: Unremarkable. No pancreatic ductal dilatation or surrounding inflammatory changes. Spleen: Normal in size without significant abnormality. Adrenals/Urinary Tract: Adrenal glands are unremarkable. Kidneys are normal, without renal calculi, solid lesion, or hydronephrosis. Bladder is unremarkable. Stomach/Bowel: Stomach is within normal limits. Appendix appears normal. No evidence of bowel wall thickening, distention, or inflammatory changes. Vascular/Lymphatic: No significant vascular findings are present. No enlarged abdominal or pelvic lymph nodes. Reproductive: No mass or other abnormality. Other: No abdominal wall hernia or abnormality. No abdominopelvic ascites. Musculoskeletal: No acute or significant osseous findings. IMPRESSION: 1. Status post bilateral mastectomy and implant reconstruction. 2. No evidence of recurrent or metastatic disease in the chest, abdomen, or pelvis. Electronically Signed   By: AEddie CandleM.D.   On: 04/23/2021 11:30     ASSESSMENT: 60y.o. BRCA negative [place] woman status post right modified radical mastectomy 06/07/2012 for a multifocal invasive ductal carcinoma, the largest of 3 masses measuring 3.0 cm, grade 1; a second mass measuring 2.3 cm, grade 2; and a third mass measuring 2.2 cm, grade 1. The 2 larger masses were both 100% estrogen receptor positive, the grade 2 mass being also progesterone receptor positive at 98%, with an MIB-1 of 16% (the larger mass was progesterone receptor negative, with an Mib-1 of 12%). Both showed no HER-2 amplification. 9 of 19 axillary lymph nodes sampled were involved. In summary:  (1) status post right modified radical mastectomy November 2013 for a mpT2 pN2a, stage IIIA invasive ductal carcinoma, grade  1-2, estrogen receptor positive, HER-2 not amplified, with an MIB-1 of 12-16%  (2) the patient opted to forego adjuvant chemotherapy  (3) adjuvant radiation therapy completed 09/03/2012   (4)  started on Tamoxifen, but she "never really took this" and declined further anti-estrogen therapy.    (5) left simple mastectomy 09/30/2012, with benign pathology; complicated by  an infected seroma MRSA infection.  (6) patient is s/p bilateral delayed reconstruction with right latissimus dorsi myocutaneous flap with tissue expander placement and left tissue expander placement in Nov. 2014. Exchange surgery was done with placement of bilateral silicone implants 9/48/0165.  Left - Mentor Smooth Round High Profile Gel 325cc. Ref #[id number]. Serial  Right - Mentor Shape Medium Heigh, High Profile Gel 300cc. Ref #[id number].   (7) history of major depressive disorder, status post transcranial magnetic simulation treatment completed December 2015  (8) restaging studies including CT scans of the neck, chest abdomen and pelvis on 04/23/2021 showed no evidence of disease recurrence   PLAN:  Larya is now just about 9 years out from definitive surgery for her breast cancer.  I reviewed her CT scan and lab work results with her today.  There is no evidence of disease recurrence.  This is very favorable.  As she understands, we do not recommend restaging studies on a routine basis but only to evaluate symptoms.  This time she had a variety of symptoms which were listed in the prior note, and any one of them could have indicated recurrence but fortunately did not.  Accordingly at this point I feel comfortable releasing her to her primary care physicians.  All she will need in terms of breast cancer follow-up is a yearly physician chest wall exam  I we will be glad to see if that again at any point in the future if and when the need arises but as of now are making no further routine appointments for her  here.  Total encounter time 20 minutes.  Kelly [name]. Alfons Sulkowski MD Oncology and Hematology [facility] [place], [place] [address] Tel. [phone]  Joylene Igo [phone]   I, Wilburn Mylar, am acting as scribe for Dr. Sarajane Jews C. Nell Gales.  I, Lurline Del MD, have reviewed the above documentation for accuracy and completeness, and I agree with the above.   *Total Encounter Time as defined by the Centers for Medicare and Medicaid Services includes, in addition to the face-to-face time of a patient visit (documented in the note above) non-face-to-face time: obtaining and reviewing outside history, ordering and reviewing medications, tests or procedures, care coordination (communications with other health care professionals or caregivers) and documentation in the medical record.

## 2021-04-29 ENCOUNTER — Other Ambulatory Visit: Payer: Self-pay

## 2021-04-29 ENCOUNTER — Inpatient Hospital Stay: Payer: No Typology Code available for payment source | Attending: Oncology | Admitting: Oncology

## 2021-04-29 VITALS — BP 104/53 | HR 78 | Temp 97.5°F | Resp 18 | Wt 135.4 lb

## 2021-04-29 DIAGNOSIS — Z8262 Family history of osteoporosis: Secondary | ICD-10-CM | POA: Insufficient documentation

## 2021-04-29 DIAGNOSIS — C50811 Malignant neoplasm of overlapping sites of right female breast: Secondary | ICD-10-CM | POA: Insufficient documentation

## 2021-04-29 DIAGNOSIS — Z170 Estrogen receptor positive status [ER+]: Secondary | ICD-10-CM | POA: Diagnosis not present

## 2021-04-29 DIAGNOSIS — Z8614 Personal history of Methicillin resistant Staphylococcus aureus infection: Secondary | ICD-10-CM | POA: Diagnosis not present

## 2021-04-29 DIAGNOSIS — Z79810 Long term (current) use of selective estrogen receptor modulators (SERMs): Secondary | ICD-10-CM | POA: Insufficient documentation

## 2021-04-29 DIAGNOSIS — Z823 Family history of stroke: Secondary | ICD-10-CM | POA: Diagnosis not present

## 2021-04-29 DIAGNOSIS — Z806 Family history of leukemia: Secondary | ICD-10-CM | POA: Diagnosis not present

## 2021-04-29 DIAGNOSIS — Z9013 Acquired absence of bilateral breasts and nipples: Secondary | ICD-10-CM | POA: Insufficient documentation

## 2021-04-29 DIAGNOSIS — Z818 Family history of other mental and behavioral disorders: Secondary | ICD-10-CM | POA: Diagnosis not present

## 2021-04-29 DIAGNOSIS — Z923 Personal history of irradiation: Secondary | ICD-10-CM | POA: Insufficient documentation

## 2021-04-29 DIAGNOSIS — Z881 Allergy status to other antibiotic agents status: Secondary | ICD-10-CM | POA: Insufficient documentation

## 2021-04-29 DIAGNOSIS — Z808 Family history of malignant neoplasm of other organs or systems: Secondary | ICD-10-CM | POA: Diagnosis not present

## 2021-04-29 DIAGNOSIS — Z8349 Family history of other endocrine, nutritional and metabolic diseases: Secondary | ICD-10-CM | POA: Diagnosis not present

## 2021-04-29 DIAGNOSIS — Z8249 Family history of ischemic heart disease and other diseases of the circulatory system: Secondary | ICD-10-CM | POA: Diagnosis not present

## 2021-04-29 DIAGNOSIS — Z803 Family history of malignant neoplasm of breast: Secondary | ICD-10-CM | POA: Insufficient documentation

## 2021-05-21 NOTE — Telephone Encounter (Signed)
No entry 

## 2021-07-01 ENCOUNTER — Other Ambulatory Visit (HOSPITAL_COMMUNITY): Payer: Self-pay

## 2021-07-01 MED ORDER — VILAZODONE HCL 40 MG PO TABS
ORAL_TABLET | ORAL | 0 refills | Status: DC
  Filled 2021-07-01: qty 90, 90d supply, fill #0

## 2021-07-02 ENCOUNTER — Other Ambulatory Visit (HOSPITAL_COMMUNITY): Payer: Self-pay

## 2021-07-02 MED ORDER — AMPHETAMINE-DEXTROAMPHETAMINE 20 MG PO TABS
ORAL_TABLET | ORAL | 0 refills | Status: DC
  Filled 2021-07-02: qty 60, 30d supply, fill #0

## 2021-07-02 MED ORDER — AMPHETAMINE-DEXTROAMPHETAMINE 20 MG PO TABS: ORAL_TABLET | ORAL | 0 refills | Status: DC

## 2021-09-26 ENCOUNTER — Other Ambulatory Visit (HOSPITAL_COMMUNITY): Payer: Self-pay

## 2021-09-26 MED ORDER — VILAZODONE HCL 40 MG PO TABS
ORAL_TABLET | ORAL | 0 refills | Status: DC
  Filled 2021-09-26: qty 90, 90d supply, fill #0

## 2021-09-27 ENCOUNTER — Other Ambulatory Visit (HOSPITAL_COMMUNITY): Payer: Self-pay

## 2021-09-27 MED ORDER — AMPHETAMINE-DEXTROAMPHETAMINE 20 MG PO TABS
ORAL_TABLET | ORAL | 0 refills | Status: DC
  Filled 2021-11-22: qty 30, 30d supply, fill #0

## 2021-09-27 MED ORDER — AMPHETAMINE-DEXTROAMPHETAMINE 20 MG PO TABS
ORAL_TABLET | ORAL | 0 refills | Status: DC
  Filled 2021-09-27: qty 30, 30d supply, fill #0

## 2021-09-27 MED ORDER — AMPHETAMINE-DEXTROAMPHETAMINE 20 MG PO TABS
ORAL_TABLET | ORAL | 0 refills | Status: DC
  Filled 2022-03-12: qty 30, 30d supply, fill #0

## 2021-09-30 ENCOUNTER — Other Ambulatory Visit (HOSPITAL_COMMUNITY): Payer: Self-pay

## 2021-10-01 ENCOUNTER — Other Ambulatory Visit (HOSPITAL_COMMUNITY): Payer: Self-pay

## 2021-10-04 ENCOUNTER — Other Ambulatory Visit (HOSPITAL_COMMUNITY): Payer: Self-pay

## 2021-10-05 ENCOUNTER — Other Ambulatory Visit (HOSPITAL_COMMUNITY): Payer: Self-pay

## 2021-10-07 ENCOUNTER — Other Ambulatory Visit (HOSPITAL_COMMUNITY): Payer: Self-pay

## 2021-10-11 ENCOUNTER — Other Ambulatory Visit (HOSPITAL_COMMUNITY): Payer: Self-pay

## 2021-11-22 ENCOUNTER — Other Ambulatory Visit (HOSPITAL_COMMUNITY): Payer: Self-pay

## 2021-12-13 IMAGING — CT CT CHEST W/ CM
2 of 5 series · 12 of 36 positions shown, 15 images · IV contrast (omnipaque)
Comparison: CT abdomen pelvis, 10/15/2017, PET-CT, 12/27/2012

CLINICAL DATA: Breast cancer restaging, status post mastectomy and
reconstruction, XRT

EXAM:
CT CHEST, ABDOMEN, AND PELVIS WITH CONTRAST
TECHNIQUE: Multidetector CT imaging of the chest, abdomen and pelvis was
performed following the standard protocol during bolus
administration of intravenous contrast.
CONTRAST:  80mL OMNIPAQUE IOHEXOL 350 MG/ML SOLN, additional oral
enteric contrast

[Series 2: cap with · axial · 0.70mm/px · z∈[-596,-102]mm · 9 of 125 slices shown, 12 images]
[im 13/125  mediastinal]
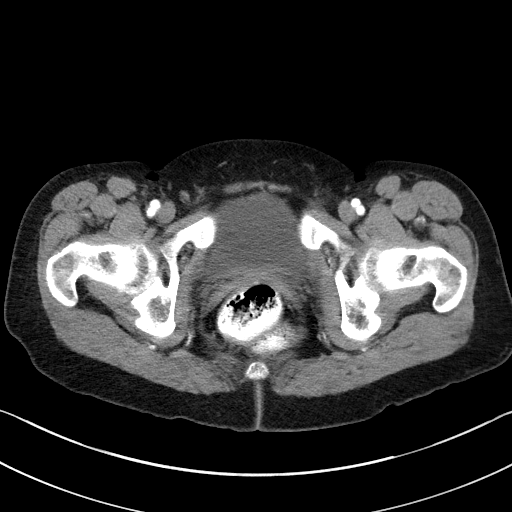
[im 13/125  lung]
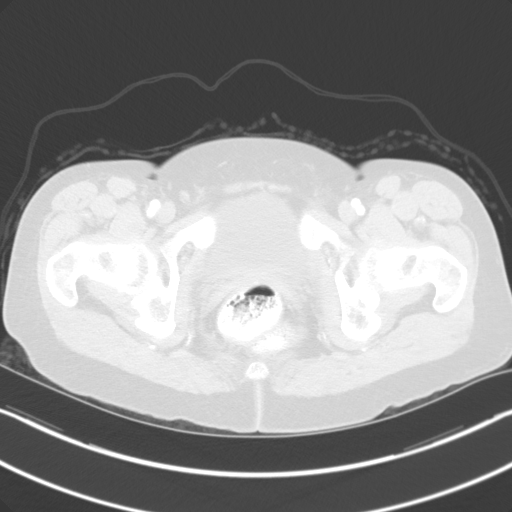
[im 25/125  lung]
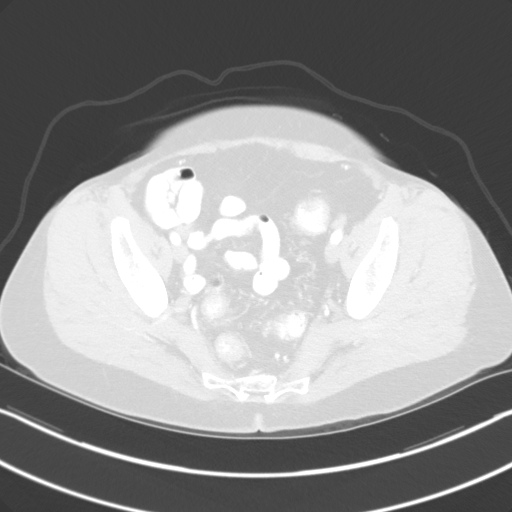
[im 38/125  lung]
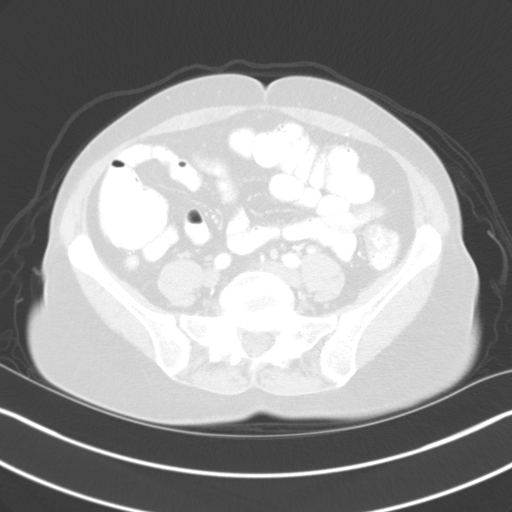
[im 50/125  lung]
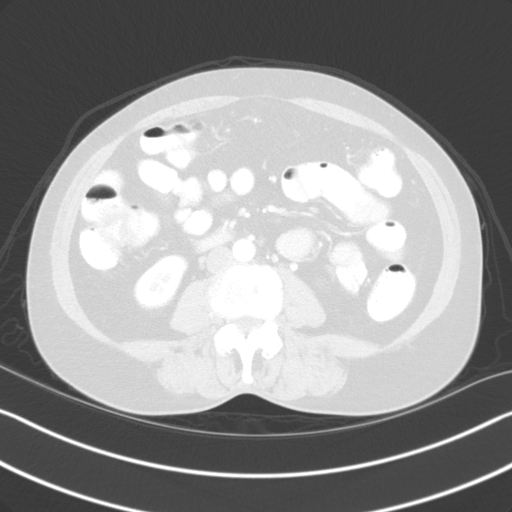
[im 63/125  mediastinal]
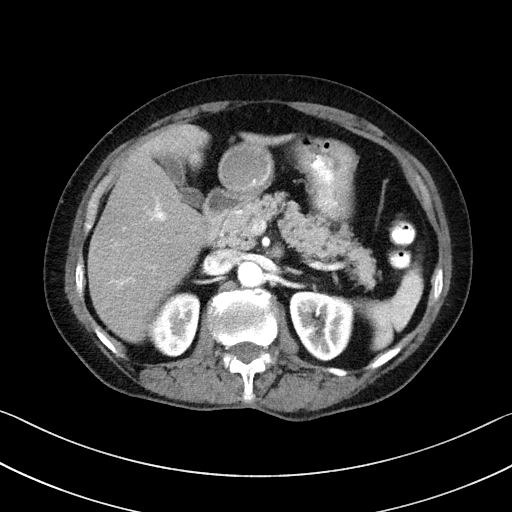
[im 63/125  lung]
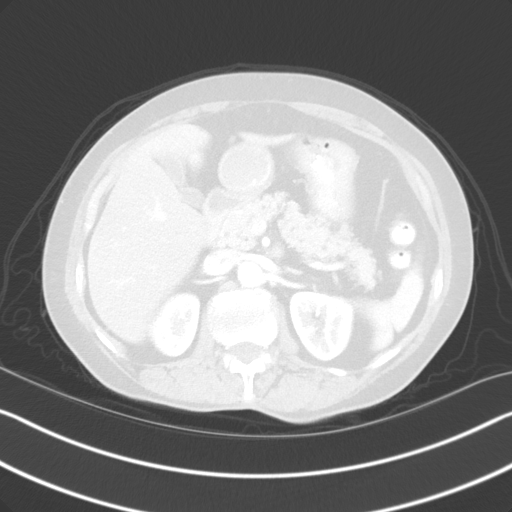
[im 75/125  lung]
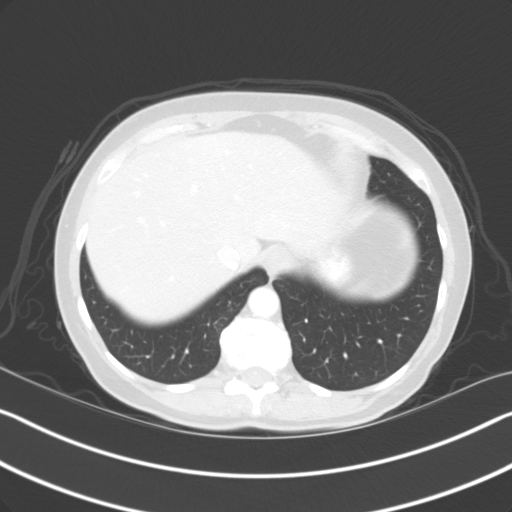
[im 87/125  lung]
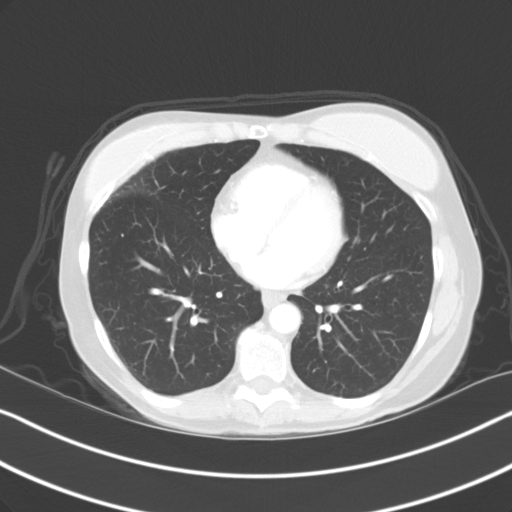
[im 100/125  lung]
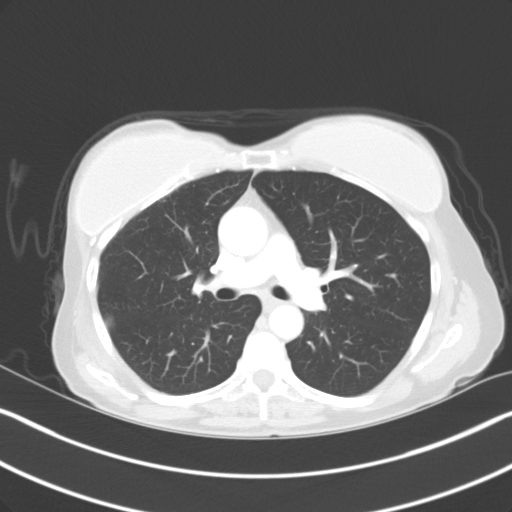
[im 112/125  mediastinal]
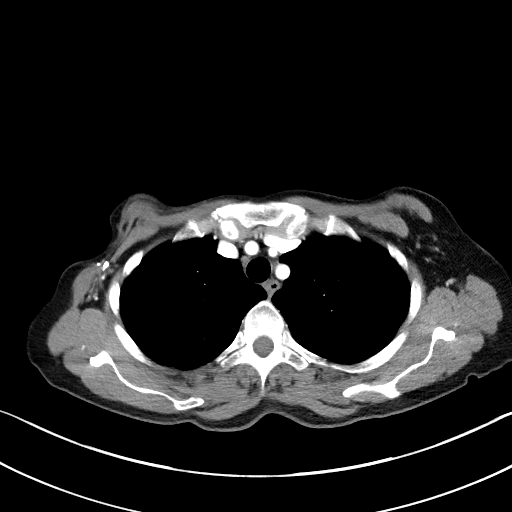
[im 112/125  lung]
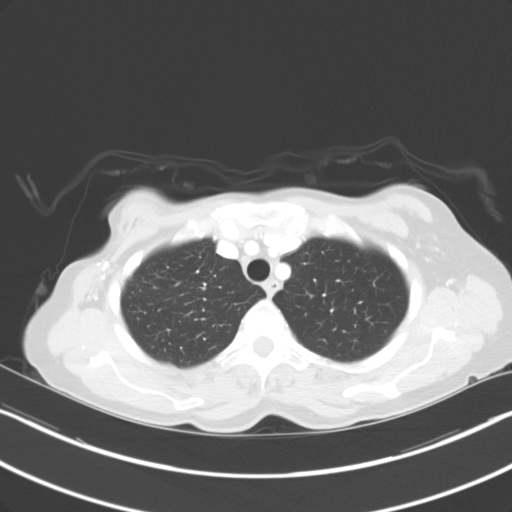

[Series 5: coronals · coronal · 0.91mm/px · 3 of 120 slices shown]
[im 24/120  lung]
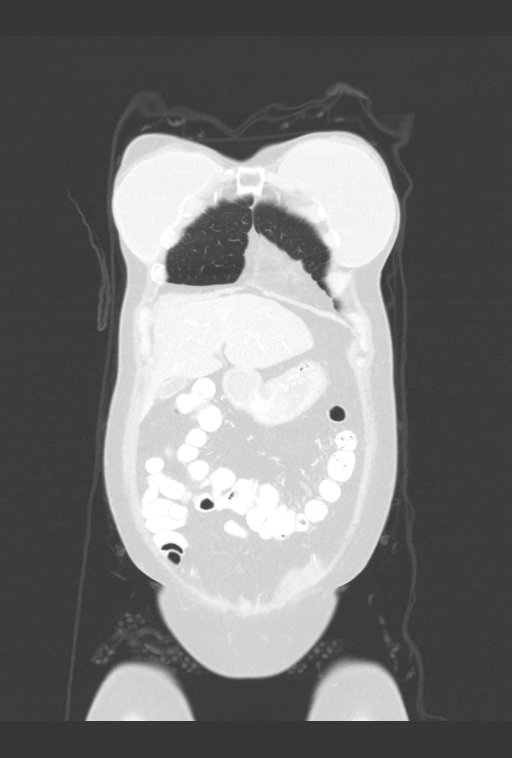
[im 48/120  lung]
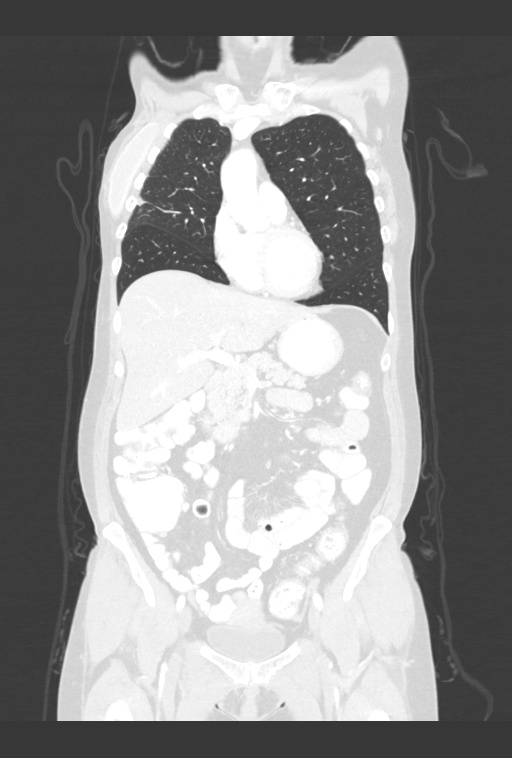
[im 72/120  lung]
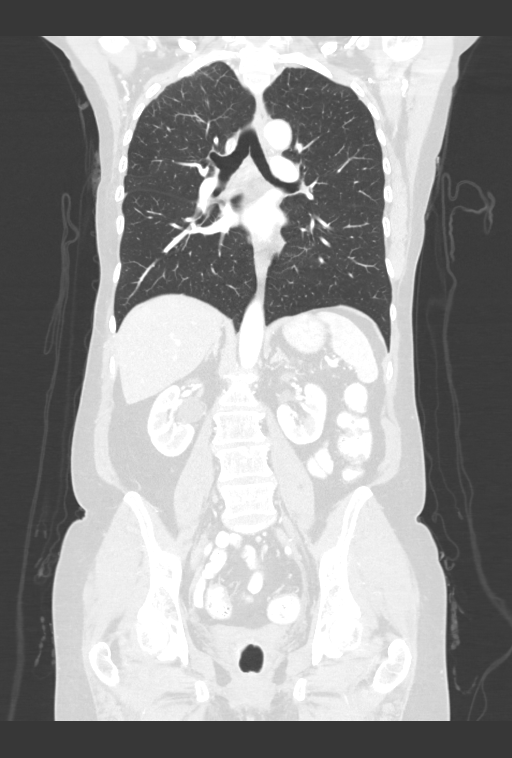

[12 of 36 positions shown; findings below may reference images not displayed]

FINDINGS: CT CHEST FINDINGS

Cardiovascular: No significant vascular findings. Normal heart size.
No pericardial effusion.

Mediastinum/Nodes: No enlarged mediastinal, hilar, or axillary lymph
nodes. Surgical clips in the right axilla. Thyroid gland, trachea,
and esophagus demonstrate no significant findings.

Lungs/Pleura: Minimal subpleural radiation fibrosis of the right
upper and right middle lobes. No pleural effusion or pneumothorax.

Musculoskeletal: No chest wall mass or suspicious bone lesions
identified. Status post bilateral mastectomy and implant
reconstruction

CT ABDOMEN PELVIS FINDINGS

Hepatobiliary: No solid liver abnormality is seen. No gallstones,
gallbladder wall thickening, or biliary dilatation.

Pancreas: Unremarkable. No pancreatic ductal dilatation or
surrounding inflammatory changes.

Spleen: Normal in size without significant abnormality.

Adrenals/Urinary Tract: Adrenal glands are unremarkable. Kidneys are
normal, without renal calculi, solid lesion, or hydronephrosis.
Bladder is unremarkable.

Stomach/Bowel: Stomach is within normal limits. Appendix appears
normal. No evidence of bowel wall thickening, distention, or
inflammatory changes.

Vascular/Lymphatic: No significant vascular findings are present. No
enlarged abdominal or pelvic lymph nodes.

Reproductive: No mass or other abnormality.

Other: No abdominal wall hernia or abnormality. No abdominopelvic
ascites.

Musculoskeletal: No acute or significant osseous findings.
IMPRESSION: 1. Status post bilateral mastectomy and implant reconstruction.
2. No evidence of recurrent or metastatic disease in the chest,
abdomen, or pelvis.

## 2021-12-13 IMAGING — CT CT NECK W/ CM
5 series · 16 of 33 positions shown, 18 images · IV contrast (omnipaque)
Comparison: CT neck 08/23/2013

CLINICAL DATA: Metastatic disease evaluation. History of breast
cancer.

EXAM:
CT NECK WITH CONTRAST
TECHNIQUE: Multidetector CT imaging of the neck was performed using the
standard protocol following the bolus administration of intravenous
contrast.
CONTRAST:  80mL OMNIPAQUE IOHEXOL 350 MG/ML SOLN

[Series 2: axial neck · axial · 0.35mm/px · z∈[-74,+46]mm · 3 of 121 slices shown]
[im 31/121  bone]
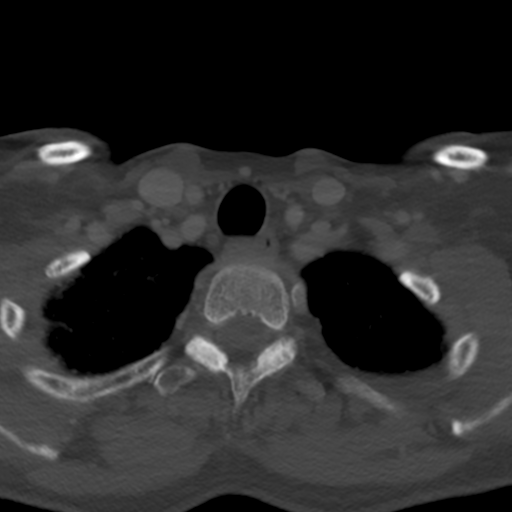
[im 61/121  bone]
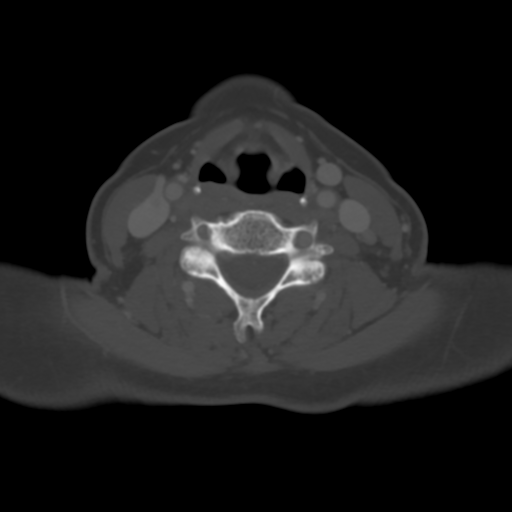
[im 91/121  bone]
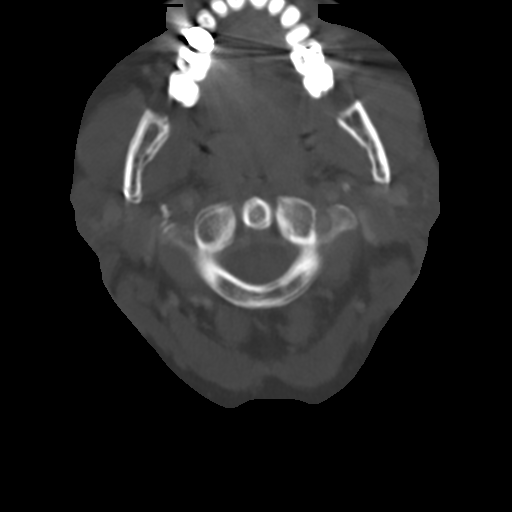

[Series 4: axial bone · axial · 0.35mm/px · z∈[-54,+26]mm · 2 of 121 slices shown]
[im 41/121  bone]
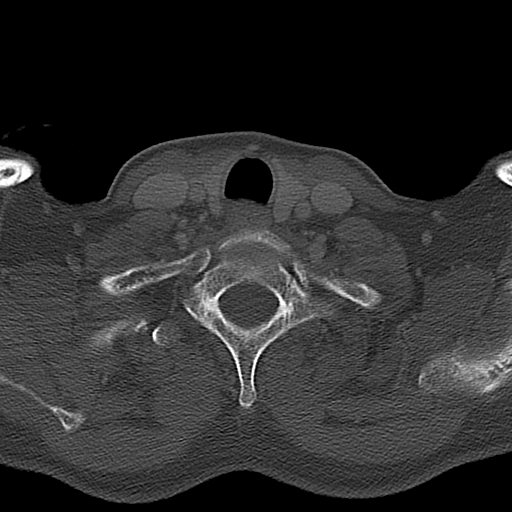
[im 81/121  bone]
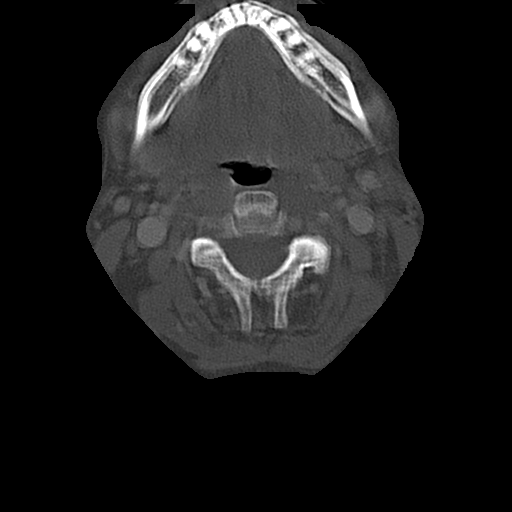

[Series 5: orthogonal (person_name) · axial · 0.43mm/px · z∈[-96,+36]mm · 3 of 135 slices shown, 4 images]
[im 34/135  soft-tissue]
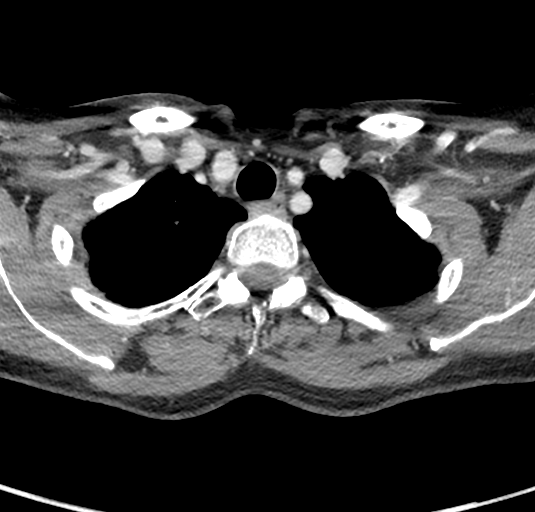
[im 34/135  bone]
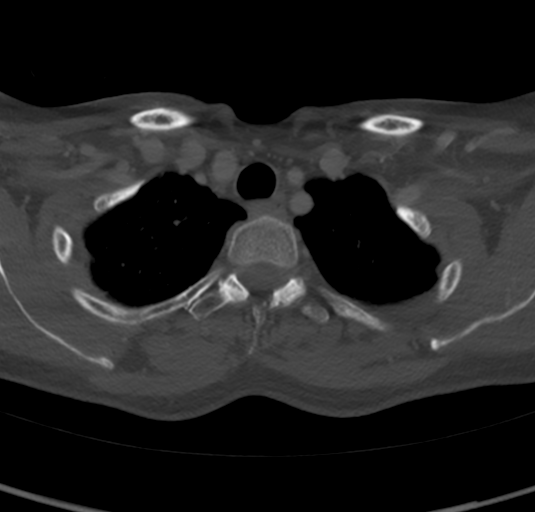
[im 68/135  bone]
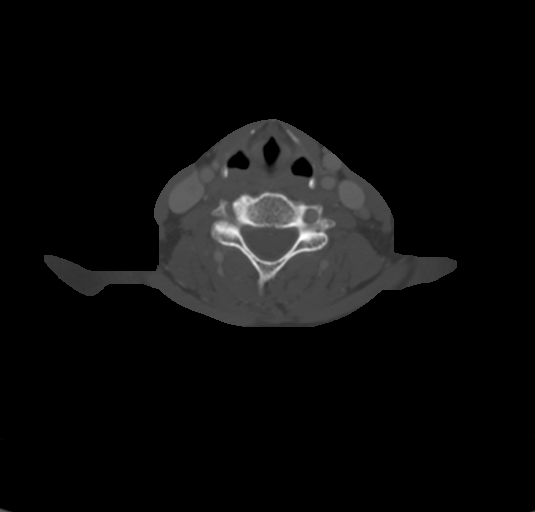
[im 101/135  bone]
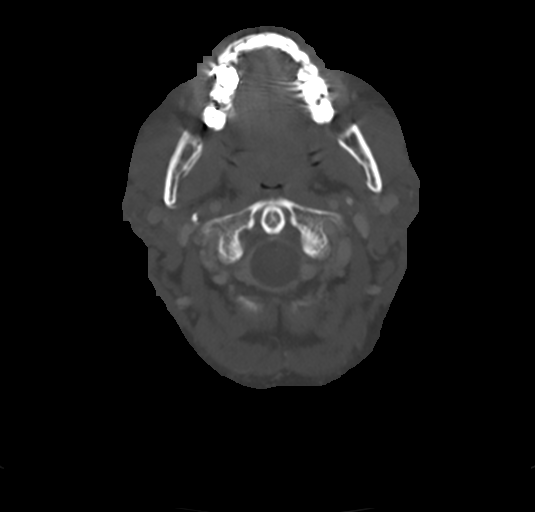

[Series 6: cor neck · coronal · 0.48mm/px · 3 of 84 slices shown]
[im 17/84  bone]
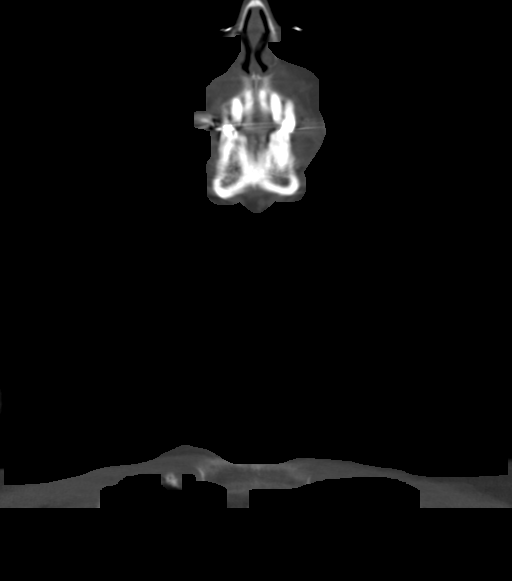
[im 34/84  bone]
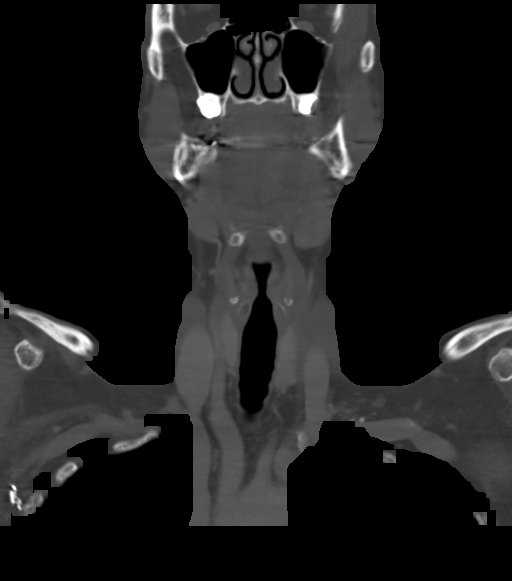
[im 50/84  bone]
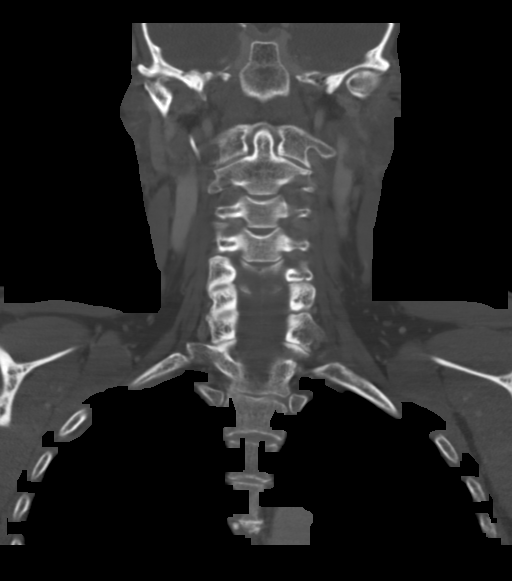

[Series 7: sag neck · sagittal · 0.53mm/px · 5 of 76 slices shown, 6 images]
[im 26/76  bone]
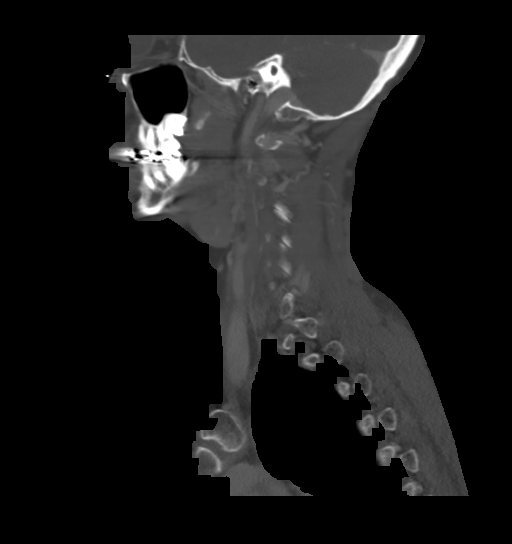
[im 32/76  bone]
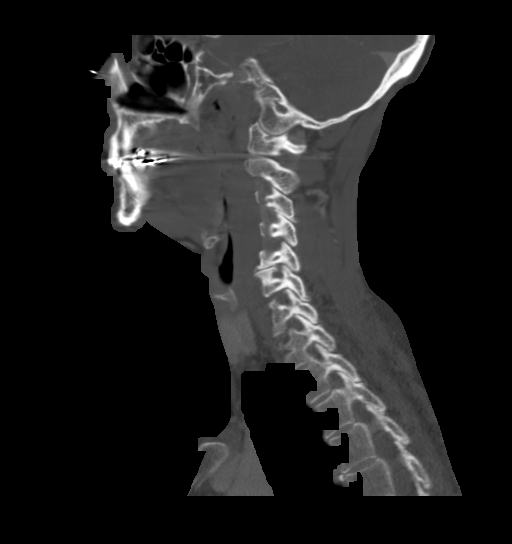
[im 38/76  soft-tissue]
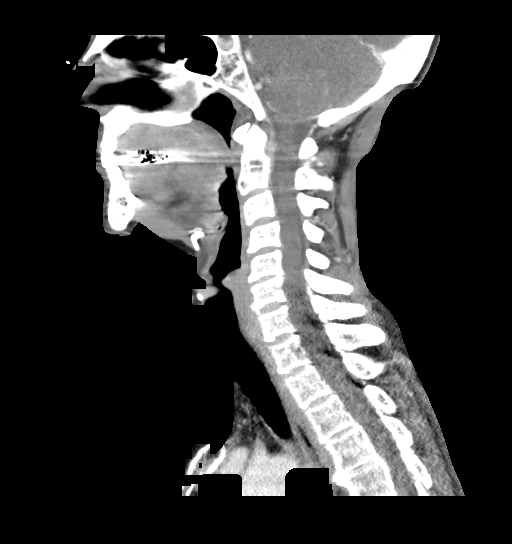
[im 38/76  bone]
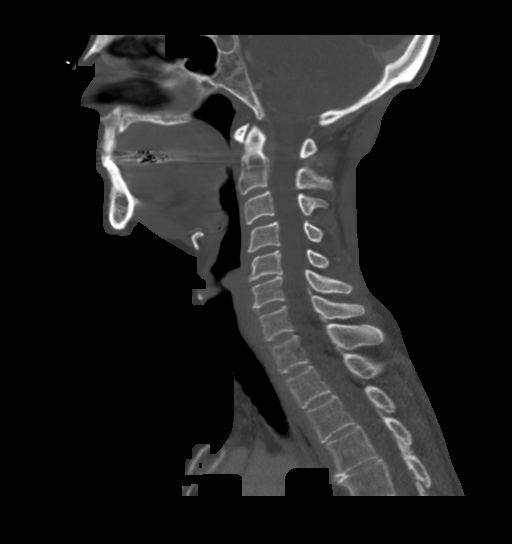
[im 44/76  bone]
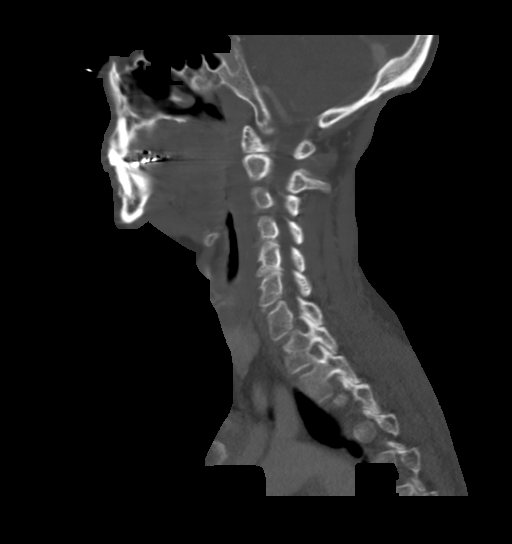
[im 51/76  bone]
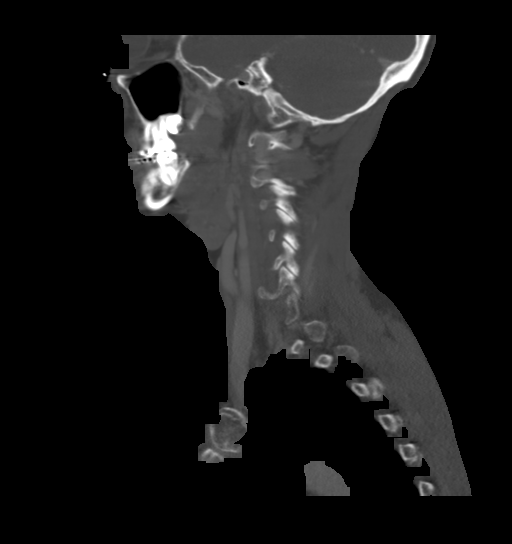

[16 of 33 positions shown; findings below may reference images not displayed]

FINDINGS: Pharynx and larynx: Normal. No mass or swelling.

Salivary glands: No inflammation, mass, or stone.

Thyroid: Normal

Lymph nodes: No enlarged lymph nodes in the neck.

Vascular: Normal vascular enhancement.

Limited intracranial: Negative

Visualized orbits: Negative

Mastoids and visualized paranasal sinuses: Paranasal sinuses clear.
Mastoid clear.

Skeleton: No skeletal lesion identified.

Upper chest: CT chest reported separately from today

Other: None
IMPRESSION: Negative CT neck.  Negative for mass or adenopathy.

## 2021-12-24 ENCOUNTER — Other Ambulatory Visit (HOSPITAL_COMMUNITY): Payer: Self-pay

## 2021-12-24 MED ORDER — AMPHETAMINE-DEXTROAMPHETAMINE 20 MG PO TABS: ORAL_TABLET | ORAL | 0 refills | Status: DC

## 2021-12-24 MED ORDER — AMPHETAMINE-DEXTROAMPHETAMINE 20 MG PO TABS
ORAL_TABLET | ORAL | 0 refills | Status: DC
  Filled 2021-12-24: qty 30, 30d supply, fill #0

## 2021-12-24 MED ORDER — VILAZODONE HCL 40 MG PO TABS
ORAL_TABLET | ORAL | 0 refills | Status: DC
  Filled 2021-12-24: qty 90, 90d supply, fill #0

## 2022-03-12 ENCOUNTER — Other Ambulatory Visit (HOSPITAL_COMMUNITY): Payer: Self-pay

## 2022-03-26 ENCOUNTER — Other Ambulatory Visit (HOSPITAL_COMMUNITY): Payer: Self-pay

## 2022-03-26 MED ORDER — AMPHETAMINE-DEXTROAMPHETAMINE 20 MG PO TABS
20.0000 mg | ORAL_TABLET | Freq: Every morning | ORAL | 0 refills | Status: DC
  Filled 2022-03-26 – 2022-05-05 (×2): qty 30, 30d supply, fill #0

## 2022-03-26 MED ORDER — AMPHETAMINE-DEXTROAMPHETAMINE 20 MG PO TABS: ORAL_TABLET | ORAL | 0 refills | Status: DC

## 2022-03-26 MED ORDER — AMPHETAMINE-DEXTROAMPHETAMINE 20 MG PO TABS
20.0000 mg | ORAL_TABLET | ORAL | 0 refills | Status: DC
  Filled 2022-06-16: qty 30, 30d supply, fill #0

## 2022-03-26 MED ORDER — VILAZODONE HCL 40 MG PO TABS
ORAL_TABLET | ORAL | 0 refills | Status: DC
  Filled 2022-03-26: qty 90, 90d supply, fill #0

## 2022-03-27 ENCOUNTER — Other Ambulatory Visit (HOSPITAL_COMMUNITY): Payer: Self-pay

## 2022-04-08 ENCOUNTER — Other Ambulatory Visit: Payer: Self-pay | Admitting: Nurse Practitioner

## 2022-04-08 ENCOUNTER — Ambulatory Visit: Payer: Self-pay

## 2022-04-08 DIAGNOSIS — M25512 Pain in left shoulder: Secondary | ICD-10-CM

## 2022-04-16 ENCOUNTER — Other Ambulatory Visit (HOSPITAL_COMMUNITY): Payer: Self-pay

## 2022-04-23 ENCOUNTER — Other Ambulatory Visit: Payer: Self-pay

## 2022-04-23 ENCOUNTER — Ambulatory Visit: Payer: No Typology Code available for payment source | Attending: Family Medicine | Admitting: Physical Therapy

## 2022-04-23 DIAGNOSIS — R42 Dizziness and giddiness: Secondary | ICD-10-CM | POA: Diagnosis present

## 2022-04-23 NOTE — Therapy (Signed)
OUTPATIENT PHYSICAL THERAPY VESTIBULAR EVALUATION     Patient Name: Kelly [name] MRN: [id number] DOB:1961-07-10, 61 y.o., female Today's Date: 04/24/2022  PCP: Glenis Smoker, MD REFERRING PROVIDER: Saintclair Halsted, FNP   PT End of Session - 04/23/22 [phone]     Visit Number 1    Number of Visits 5    Date for PT Re-Evaluation 05/23/22    Authorization Type [facility]    PT Start Time 0807    PT Stop Time 0847    PT Time Calculation (min) 40 min    Activity Tolerance Patient tolerated treatment well    Behavior During Therapy [facility] for tasks assessed/performed             Past Medical History:  Diagnosis Date   Abnormal Pap smear of cervix    with conization   Allergic rhinitis    Anxiety    Arthritis    Breast cancer ([place]) 05/05/12   right, ER +, PR -, Her 2 -   Depression    History of radiation therapy 07/14/2012-09/03/12   right chest wall 59.4 gray   Osteoarthritis    Scoliosis    Followed by Dr. Harlow Asa    Sleep apnea    cpap-has not been using-doing well without it   Past Surgical History:  Procedure Laterality Date   BREAST RECONSTRUCTION WITH PLACEMENT OF TISSUE EXPANDER AND FLEX HD (ACELLULAR HYDRATED DERMIS) Left 06/06/2013   Procedure: PLACEMENT OF LEFT BREAST TISSUE EXPANDER AND FLEX HD ;  Surgeon: Theodoro Kos, DO;  Location: [place];  Service: Plastics;  Laterality: Left;   BREAST SURGERY     several biopsies, needle aspiration   CERVICAL CONIZATION W/BX     CESAREAN SECTION     x3   COSMETIC SURGERY  04/19/14   Dr. Migdalia Dk, mini tummy tuck, smoothed out right breast, left breast lift   DILATATION & CURRETTAGE/HYSTEROSCOPY WITH RESECTOCOPE N/A 06/12/2014   Procedure: DILATATION & CURETTAGE/HYSTEROSCOPY ;  Surgeon: Lyman Speller, MD;  Location: [facility];  Service: Gynecology;  Laterality: N/A;   JOINT REPLACEMENT     Rt and Lt thumbs   LATISSIMUS FLAP TO BREAST Right 06/06/2013   Procedure: RIGHT LATISSIMUS MYOCUTANEOUS FLAP WITH PLACEMENT  OF TISSUE EXPANDER IN RIGHT BREAST ;  Surgeon: Theodoro Kos, DO;  Location: [facility];  Service: Plastics;  Laterality: Right;   MASTECTOMY MODIFIED RADICAL  06/07/2012   Procedure: MASTECTOMY MODIFIED RADICAL;  Surgeon: Haywood Lasso, MD;  Location: [place];  Service: General;  Laterality: Right;   MASTECTOMY W/ SENTINEL NODE BIOPSY  06/07/2012   Procedure: MASTECTOMY WITH SENTINEL LYMPH NODE BIOPSY;  Surgeon: Haywood Lasso, MD;  Location: [place];  Service: General;  Laterality: Right;  right total mastectomy and sentinel node   REMOVAL OF BILATERAL TISSUE EXPANDERS WITH PLACEMENT OF BILATERAL BREAST IMPLANTS Bilateral 11/10/2013   Procedure: REMOVAL OF BILATERAL TISSUE EXPANDERS WITH PLACEMENT OF SILICONE BREAST IMPLANTS WITH BILATERAL CAPSULOTOMIES;  Surgeon: Theodoro Kos, DO;  Location: [place];  Service: Plastics;  Laterality: Bilateral;   SIMPLE MASTECTOMY WITH AXILLARY SENTINEL NODE BIOPSY Left 09/30/2012   Procedure: SIMPLE MASTECTOMY;  Surgeon: Haywood Lasso, MD;  Location: [place];  Service: General;  Laterality: Left;  left total mastectomy   TUBAL LIGATION     Patient Active Problem List   Diagnosis Date Noted   Malignant neoplasm of overlapping sites of right breast in female, estrogen receptor positive ([place]) 03/09/2017   Breast  replaced by other means 11/15/2013   Status post bilateral breast implants 11/15/2013   Status post bilateral breast reconstruction 11/15/2013   Low back pain 05/31/2013   Right breast cancer, IDC, multicentric 05/05/2012    ONSET DATE: 04/22/2022 (MD referral)  REFERRING DIAG: R42 (ICD-10-CM) - Dizziness and giddiness  THERAPY DIAG:  Dizziness and giddiness  Rationale for Evaluation and Treatment Rehabilitation  SUBJECTIVE:   SUBJECTIVE STATEMENT: Had some shoulder pain following a training for work.  Had dizziness and "not quite right feeling".  About a week ago was when this started. Pt accompanied  by: self  PERTINENT HISTORY: breast cancer, scoliosis, OA, anxiety, depression,    PAIN:  Are you having pain? No  PRECAUTIONS: None  WEIGHT BEARING RESTRICTIONS No  FALLS: Has patient fallen in last 6 months? No  LIVING ENVIRONMENT: Lives with: lives with their spouse Lives in: House/apartment  PLOF: Independent  PATIENT GOALS To not have to deal with this dizziness  OBJECTIVE:   TRANSFERS: Assistive device utilized: None  Sit to stand: Complete Independence Stand to sit: Complete Independence  GAIT: Gait pattern: WFL Distance walked: clinic distances  Assistive device utilized: None Level of assistance: Complete Independence  PATIENT SURVEYS:  FOTO NP-pt was not entered in FOTO prior to eval   VESTIBULAR ASSESSMENT   GENERAL OBSERVATION: Pt reports change of work schedule, participated in training for self-defense maneuvers, with unusual motions for her    SYMPTOM BEHAVIOR:   Subjective history: About a week ago, had an episode of vertigo after some feelings of dizziness.  Tried some maneuvers of Epley and felt better.   Non-Vestibular symptoms:  had shoulder pain and dizziness after a training at work; have had cough   Type of dizziness: Spinning/Vertigo and Unsteady with head/body turns   Frequency: 1 time   Duration: hours   Aggravating factors: No known aggravating factors   Relieving factors:  Tried Epley maneuvers   Progression of symptoms: better   OCULOMOTOR EXAM:   Ocular Alignment: normal   Ocular ROM: No Limitations   Spontaneous Nystagmus: absent   Gaze-Induced Nystagmus: absent   Smooth Pursuits: intact   Saccades: intact   Convergence/Divergence: WFL cm     VESTIBULAR - OCULAR REFLEX:    Slow VOR: Normal   VOR Cancellation: Normal   Head-Impulse Test: HIT Right: negative HIT Left: negative   Dynamic Visual Acuity: Static: 8 Dynamic: 5  With dynamic visual acuity test, makes errors on line 5 and line 6 attempts.  She reports being  "okay" after test, but more cautious and slow to return to movement.  No c/o dizziness.  POSITIONAL TESTING: Right Dix-Hallpike: no nystagmus Left Dix-Hallpike: no nystagmus Right Roll Test: no nystagmus Left Roll Test: no nystagmus    M-CTSIB  Condition 1: Firm Surface, EO 30 Sec, Normal Sway  Condition 2: Firm Surface, EC 30 Sec, Normal Sway  Condition 3: Foam Surface, EO 30 Sec, Normal Sway  Condition 4: Foam Surface, EC 30 Sec, Mild Sway      VESTIBULAR TREATMENT: Initiated HEP: Access Code: [email or web address] URL: [email or web address] Date: 04/23/2022 Prepared by: [facility]  Exercises - Seated Gaze Stabilization with Head Rotation  - 2 x daily - 7 x weekly - 2 sets - 30 sec hold - Standing Gaze Stabilization with Head Rotation  - 2 x daily - 7 x weekly - 2 sets - 30 sec hold  PATIENT EDUCATION: Education details: PT eval results, POC, HEP initiation  Person educated: Patient Education method: Explanation, Demonstration, and Handouts Education comprehension: verbalized understanding and returned demonstration   GOALS: Goals reviewed with patient? Yes  SHORT TERM GOALS: = LTGs  LONG TERM GOALS: Target date: 05/23/2022  Pt will be independent with HEP for improved dizziness, no c/o vertigo. Baseline:  Goal status: INITIAL  2.  Pt will report no dizziness of vertigo symptoms with work and leisure activities. Baseline:  Goal status: INITIAL  ASSESSMENT:  CLINICAL IMPRESSION: Patient is a 61 y.o. female who was seen today for physical therapy evaluation and treatment for dizziness.  She reports onset approx 1 week ago of dizziness and just not feeling right.  She reports doing some Epley manuevers with husband's assistance and has not had the return of the dizziness feeling.  With positional testing today, pt does not exhibit nystagmus and does not report any symptoms.  With oculomotor testing, she performs WNL, except extra  pause with dynamic visual acuity testing (extra time to recover to baseline, but no c/o dizziness) and she has 3 line difference between static and dynamic visual acuity.  Initiated HEP today to address this, but given pt's return to near baseline per her report and positional vertigo symptoms having resolved, PT recommends follow-up visits to PT as needed if symptoms return in next 30 days; otherwise, will plan to discharge.  OBJECTIVE IMPAIRMENTS dizziness.   ACTIVITY LIMITATIONS locomotion level and caring for others  PARTICIPATION LIMITATIONS: community activity and occupation  PERSONAL FACTORS 3+ comorbidities: see above  are also affecting patient's functional outcome.   REHAB POTENTIAL: Good  CLINICAL DECISION MAKING: Stable/uncomplicated  EVALUATION COMPLEXITY: Low   PLAN: PT FREQUENCY: 1x/week  PT DURATION: 4 weeks plus eval  PLANNED INTERVENTIONS: Therapeutic exercises, Therapeutic activity, Neuromuscular re-education, Balance training, Gait training, Patient/Family education, Self Care, Vestibular training, and Canalith repositioning  PLAN FOR NEXT SESSION: Pt to return for vestibular reassessment if symptoms re-occur in next 30 days; otherwise, plan to discharge.   Frazier Butt., PT 04/24/2022, 9:44 AM   [facility] [place], [place], [place] [address] Phone # [phone] Fax # [phone]

## 2022-04-24 ENCOUNTER — Encounter: Payer: Self-pay | Admitting: Physical Therapy

## 2022-05-05 ENCOUNTER — Other Ambulatory Visit (HOSPITAL_COMMUNITY): Payer: Self-pay

## 2022-06-16 ENCOUNTER — Other Ambulatory Visit (HOSPITAL_COMMUNITY): Payer: Self-pay

## 2022-06-16 ENCOUNTER — Encounter: Payer: Self-pay | Admitting: Physical Therapy

## 2022-06-16 NOTE — Therapy (Signed)
[facility] [address], [place], [place], [address] Phone: [phone]   Fax:  [phone]  Patient Details  Name: Kelly [name] MRN: [id number] Date of Birth: Feb 26, 1961 Referring Provider:  Saintclair Halsted, FNP  Encounter Date: 06/16/2022  PHYSICAL THERAPY DISCHARGE SUMMARY  Visits from Start of Care: 1 (eval only)  Current functional level related to goals / functional outcomes: See eval-pt did not return since eval.   Remaining deficits: See eval   Education / Equipment: Not fully addressed, as pt did not return after eval.   Patient agrees to discharge. Patient goals were not met. Patient is being discharged due to not returning since the last visit.   Carr Shartzer W., PT 06/16/2022, 8:20 AM  [facility] [address] [place], [place], [address] Phone: [phone]   Fax:  [phone]

## 2022-06-20 ENCOUNTER — Other Ambulatory Visit (HOSPITAL_COMMUNITY): Payer: Self-pay

## 2022-06-23 ENCOUNTER — Other Ambulatory Visit (HOSPITAL_BASED_OUTPATIENT_CLINIC_OR_DEPARTMENT_OTHER): Payer: Self-pay

## 2022-06-23 ENCOUNTER — Other Ambulatory Visit (HOSPITAL_COMMUNITY): Payer: Self-pay

## 2022-06-23 MED ORDER — AMPHETAMINE-DEXTROAMPHETAMINE 20 MG PO TABS
20.0000 mg | ORAL_TABLET | Freq: Every morning | ORAL | 0 refills | Status: DC
  Filled 2022-11-06: qty 30, 30d supply, fill #0

## 2022-06-23 MED ORDER — AMPHETAMINE-DEXTROAMPHETAMINE 20 MG PO TABS
20.0000 mg | ORAL_TABLET | Freq: Every morning | ORAL | 0 refills | Status: DC
  Filled 2022-09-10: qty 30, 30d supply, fill #0

## 2022-06-23 MED ORDER — VILAZODONE HCL 40 MG PO TABS
40.0000 mg | ORAL_TABLET | Freq: Every day | ORAL | 0 refills | Status: DC
  Filled 2022-08-04 – 2023-04-21 (×6): qty 90, 90d supply, fill #0

## 2022-06-23 MED ORDER — AMPHETAMINE-DEXTROAMPHETAMINE 20 MG PO TABS
20.0000 mg | ORAL_TABLET | Freq: Every morning | ORAL | 0 refills | Status: DC
  Filled 2022-08-04: qty 30, 30d supply, fill #0

## 2022-08-04 ENCOUNTER — Other Ambulatory Visit: Payer: Self-pay

## 2022-08-04 ENCOUNTER — Other Ambulatory Visit (HOSPITAL_COMMUNITY): Payer: Self-pay

## 2022-08-04 DIAGNOSIS — M79609 Pain in unspecified limb: Secondary | ICD-10-CM | POA: Diagnosis not present

## 2022-08-04 DIAGNOSIS — M62838 Other muscle spasm: Secondary | ICD-10-CM | POA: Diagnosis not present

## 2022-08-05 ENCOUNTER — Other Ambulatory Visit: Payer: Self-pay

## 2022-08-05 ENCOUNTER — Other Ambulatory Visit (HOSPITAL_COMMUNITY): Payer: Self-pay

## 2022-08-06 ENCOUNTER — Other Ambulatory Visit: Payer: Self-pay

## 2022-08-07 ENCOUNTER — Encounter: Payer: Self-pay | Admitting: Pharmacist

## 2022-08-07 ENCOUNTER — Other Ambulatory Visit: Payer: Self-pay

## 2022-08-14 ENCOUNTER — Other Ambulatory Visit: Payer: Self-pay

## 2022-09-10 ENCOUNTER — Ambulatory Visit
Admission: RE | Admit: 2022-09-10 | Discharge: 2022-09-10 | Disposition: A | Discharge status: Home / Self Care | Payer: 59 | Source: Ambulatory Visit | Attending: Family Medicine | Admitting: Family Medicine

## 2022-09-10 ENCOUNTER — Other Ambulatory Visit: Payer: Self-pay | Admitting: Family Medicine

## 2022-09-10 ENCOUNTER — Other Ambulatory Visit (HOSPITAL_COMMUNITY): Payer: Self-pay

## 2022-09-10 DIAGNOSIS — Z0000 Encounter for general adult medical examination without abnormal findings: Secondary | ICD-10-CM | POA: Diagnosis not present

## 2022-09-10 DIAGNOSIS — M25531 Pain in right wrist: Secondary | ICD-10-CM

## 2022-09-10 DIAGNOSIS — R059 Cough, unspecified: Secondary | ICD-10-CM | POA: Diagnosis not present

## 2022-09-10 DIAGNOSIS — Z13220 Encounter for screening for lipoid disorders: Secondary | ICD-10-CM | POA: Diagnosis not present

## 2022-09-10 DIAGNOSIS — M542 Cervicalgia: Secondary | ICD-10-CM | POA: Diagnosis not present

## 2022-09-10 DIAGNOSIS — M19031 Primary osteoarthritis, right wrist: Secondary | ICD-10-CM | POA: Diagnosis not present

## 2022-09-10 DIAGNOSIS — M50322 Other cervical disc degeneration at C5-C6 level: Secondary | ICD-10-CM | POA: Diagnosis not present

## 2022-09-10 DIAGNOSIS — Z853 Personal history of malignant neoplasm of breast: Secondary | ICD-10-CM | POA: Diagnosis not present

## 2022-09-10 DIAGNOSIS — M79602 Pain in left arm: Secondary | ICD-10-CM

## 2022-09-10 DIAGNOSIS — M19012 Primary osteoarthritis, left shoulder: Secondary | ICD-10-CM | POA: Diagnosis not present

## 2022-09-10 DIAGNOSIS — M25539 Pain in unspecified wrist: Secondary | ICD-10-CM | POA: Diagnosis not present

## 2022-09-15 ENCOUNTER — Other Ambulatory Visit: Payer: Self-pay

## 2022-09-15 ENCOUNTER — Other Ambulatory Visit (HOSPITAL_COMMUNITY): Payer: Self-pay

## 2022-09-15 DIAGNOSIS — F331 Major depressive disorder, recurrent, moderate: Secondary | ICD-10-CM | POA: Diagnosis not present

## 2022-09-15 DIAGNOSIS — F411 Generalized anxiety disorder: Secondary | ICD-10-CM | POA: Diagnosis not present

## 2022-09-15 DIAGNOSIS — F902 Attention-deficit hyperactivity disorder, combined type: Secondary | ICD-10-CM | POA: Diagnosis not present

## 2022-09-15 MED ORDER — AMPHETAMINE-DEXTROAMPHETAMINE 20 MG PO TABS
20.0000 mg | ORAL_TABLET | Freq: Every morning | ORAL | 0 refills | Status: DC
  Filled 2022-11-06: qty 30, 30d supply, fill #0

## 2022-09-15 MED ORDER — AMPHETAMINE-DEXTROAMPHETAMINE 20 MG PO TABS: 20.0000 mg | ORAL_TABLET | Freq: Every morning | ORAL | 0 refills | Status: DC

## 2022-09-15 MED ORDER — AMPHETAMINE-DEXTROAMPHETAMINE 20 MG PO TABS
20.0000 mg | ORAL_TABLET | Freq: Every morning | ORAL | 0 refills | Status: DC
  Filled 2022-11-19: qty 30, 30d supply, fill #0

## 2022-09-15 MED ORDER — VILAZODONE HCL 40 MG PO TABS
40.0000 mg | ORAL_TABLET | Freq: Every day | ORAL | 0 refills | Status: DC
  Filled 2022-09-15: qty 90, 90d supply, fill #0

## 2022-09-16 ENCOUNTER — Other Ambulatory Visit (HOSPITAL_COMMUNITY): Payer: Self-pay

## 2022-09-29 DIAGNOSIS — M4722 Other spondylosis with radiculopathy, cervical region: Secondary | ICD-10-CM | POA: Diagnosis not present

## 2022-09-29 DIAGNOSIS — M542 Cervicalgia: Secondary | ICD-10-CM | POA: Diagnosis not present

## 2022-10-02 DIAGNOSIS — M542 Cervicalgia: Secondary | ICD-10-CM | POA: Diagnosis not present

## 2022-10-07 DIAGNOSIS — M4722 Other spondylosis with radiculopathy, cervical region: Secondary | ICD-10-CM | POA: Diagnosis not present

## 2022-10-14 DIAGNOSIS — M5412 Radiculopathy, cervical region: Secondary | ICD-10-CM | POA: Diagnosis not present

## 2022-10-23 DIAGNOSIS — M5412 Radiculopathy, cervical region: Secondary | ICD-10-CM | POA: Diagnosis not present

## 2022-11-03 DIAGNOSIS — R531 Weakness: Secondary | ICD-10-CM | POA: Diagnosis not present

## 2022-11-03 DIAGNOSIS — M542 Cervicalgia: Secondary | ICD-10-CM | POA: Diagnosis not present

## 2022-11-03 DIAGNOSIS — M79602 Pain in left arm: Secondary | ICD-10-CM | POA: Diagnosis not present

## 2022-11-06 ENCOUNTER — Other Ambulatory Visit (HOSPITAL_COMMUNITY): Payer: Self-pay

## 2022-11-07 ENCOUNTER — Other Ambulatory Visit (HOSPITAL_COMMUNITY): Payer: Self-pay

## 2022-11-07 ENCOUNTER — Other Ambulatory Visit: Payer: Self-pay

## 2022-11-10 ENCOUNTER — Other Ambulatory Visit (HOSPITAL_COMMUNITY): Payer: Self-pay

## 2022-11-10 ENCOUNTER — Encounter (HOSPITAL_COMMUNITY): Payer: Self-pay

## 2022-11-12 ENCOUNTER — Other Ambulatory Visit: Payer: Self-pay

## 2022-11-12 DIAGNOSIS — M79602 Pain in left arm: Secondary | ICD-10-CM | POA: Diagnosis not present

## 2022-11-12 DIAGNOSIS — R531 Weakness: Secondary | ICD-10-CM | POA: Diagnosis not present

## 2022-11-12 DIAGNOSIS — M542 Cervicalgia: Secondary | ICD-10-CM | POA: Diagnosis not present

## 2022-11-13 DIAGNOSIS — R531 Weakness: Secondary | ICD-10-CM | POA: Diagnosis not present

## 2022-11-13 DIAGNOSIS — M542 Cervicalgia: Secondary | ICD-10-CM | POA: Diagnosis not present

## 2022-11-13 DIAGNOSIS — M79602 Pain in left arm: Secondary | ICD-10-CM | POA: Diagnosis not present

## 2022-11-19 ENCOUNTER — Other Ambulatory Visit (HOSPITAL_COMMUNITY): Payer: Self-pay

## 2022-11-19 DIAGNOSIS — M542 Cervicalgia: Secondary | ICD-10-CM | POA: Diagnosis not present

## 2022-11-19 DIAGNOSIS — M79602 Pain in left arm: Secondary | ICD-10-CM | POA: Diagnosis not present

## 2022-11-19 DIAGNOSIS — R531 Weakness: Secondary | ICD-10-CM | POA: Diagnosis not present

## 2022-11-20 ENCOUNTER — Other Ambulatory Visit (HOSPITAL_COMMUNITY): Payer: Self-pay

## 2022-11-20 MED ORDER — GABAPENTIN 300 MG PO CAPS
300.0000 mg | ORAL_CAPSULE | Freq: Three times a day (TID) | ORAL | 0 refills | Status: DC
  Filled 2022-11-20: qty 90, 30d supply, fill #0

## 2022-11-21 DIAGNOSIS — M79602 Pain in left arm: Secondary | ICD-10-CM | POA: Diagnosis not present

## 2022-11-21 DIAGNOSIS — R531 Weakness: Secondary | ICD-10-CM | POA: Diagnosis not present

## 2022-11-21 DIAGNOSIS — M542 Cervicalgia: Secondary | ICD-10-CM | POA: Diagnosis not present

## 2022-11-26 DIAGNOSIS — M79602 Pain in left arm: Secondary | ICD-10-CM | POA: Diagnosis not present

## 2022-11-26 DIAGNOSIS — M542 Cervicalgia: Secondary | ICD-10-CM | POA: Diagnosis not present

## 2022-11-26 DIAGNOSIS — R531 Weakness: Secondary | ICD-10-CM | POA: Diagnosis not present

## 2022-12-01 DIAGNOSIS — M4722 Other spondylosis with radiculopathy, cervical region: Secondary | ICD-10-CM | POA: Diagnosis not present

## 2022-12-01 DIAGNOSIS — M542 Cervicalgia: Secondary | ICD-10-CM | POA: Diagnosis not present

## 2022-12-10 ENCOUNTER — Other Ambulatory Visit (HOSPITAL_COMMUNITY): Payer: Self-pay

## 2022-12-10 ENCOUNTER — Other Ambulatory Visit: Payer: Self-pay

## 2022-12-10 DIAGNOSIS — F331 Major depressive disorder, recurrent, moderate: Secondary | ICD-10-CM | POA: Diagnosis not present

## 2022-12-10 DIAGNOSIS — F902 Attention-deficit hyperactivity disorder, combined type: Secondary | ICD-10-CM | POA: Diagnosis not present

## 2022-12-10 DIAGNOSIS — F411 Generalized anxiety disorder: Secondary | ICD-10-CM | POA: Diagnosis not present

## 2022-12-10 MED ORDER — AMPHETAMINE-DEXTROAMPHETAMINE 20 MG PO TABS
20.0000 mg | ORAL_TABLET | Freq: Every morning | ORAL | 0 refills | Status: DC
  Filled 2023-04-21: qty 30, 30d supply, fill #0
  Filled ????-??-??: fill #0

## 2022-12-10 MED ORDER — VILAZODONE HCL 40 MG PO TABS
40.0000 mg | ORAL_TABLET | Freq: Every day | ORAL | 0 refills | Status: DC
  Filled 2022-12-10: qty 90, 90d supply, fill #0

## 2022-12-10 MED ORDER — AMPHETAMINE-DEXTROAMPHETAMINE 20 MG PO TABS
20.0000 mg | ORAL_TABLET | Freq: Every morning | ORAL | 0 refills | Status: DC
  Filled 2023-01-15: qty 30, 30d supply, fill #0

## 2022-12-10 MED ORDER — AMPHETAMINE-DEXTROAMPHETAMINE 20 MG PO TABS
20.0000 mg | ORAL_TABLET | Freq: Every morning | ORAL | 0 refills | Status: DC
  Filled 2023-03-16: qty 30, 30d supply, fill #0

## 2022-12-10 MED ORDER — TRAZODONE HCL 50 MG PO TABS
25.0000 mg | ORAL_TABLET | Freq: Every evening | ORAL | 2 refills | Status: DC
  Filled 2022-12-10: qty 30, 30d supply, fill #0
  Filled 2023-01-15: qty 30, 30d supply, fill #1
  Filled 2023-02-12: qty 30, 30d supply, fill #2

## 2022-12-11 ENCOUNTER — Other Ambulatory Visit (HOSPITAL_COMMUNITY): Payer: Self-pay

## 2022-12-12 DIAGNOSIS — M5412 Radiculopathy, cervical region: Secondary | ICD-10-CM | POA: Diagnosis not present

## 2022-12-18 DIAGNOSIS — M79602 Pain in left arm: Secondary | ICD-10-CM | POA: Diagnosis not present

## 2022-12-18 DIAGNOSIS — M542 Cervicalgia: Secondary | ICD-10-CM | POA: Diagnosis not present

## 2022-12-18 DIAGNOSIS — R531 Weakness: Secondary | ICD-10-CM | POA: Diagnosis not present

## 2022-12-26 DIAGNOSIS — R531 Weakness: Secondary | ICD-10-CM | POA: Diagnosis not present

## 2022-12-26 DIAGNOSIS — M542 Cervicalgia: Secondary | ICD-10-CM | POA: Diagnosis not present

## 2022-12-26 DIAGNOSIS — M79602 Pain in left arm: Secondary | ICD-10-CM | POA: Diagnosis not present

## 2023-01-06 DIAGNOSIS — M5412 Radiculopathy, cervical region: Secondary | ICD-10-CM | POA: Diagnosis not present

## 2023-01-07 DIAGNOSIS — M79602 Pain in left arm: Secondary | ICD-10-CM | POA: Diagnosis not present

## 2023-01-07 DIAGNOSIS — R531 Weakness: Secondary | ICD-10-CM | POA: Diagnosis not present

## 2023-01-07 DIAGNOSIS — M542 Cervicalgia: Secondary | ICD-10-CM | POA: Diagnosis not present

## 2023-01-15 ENCOUNTER — Other Ambulatory Visit (HOSPITAL_COMMUNITY): Payer: Self-pay

## 2023-01-15 DIAGNOSIS — R531 Weakness: Secondary | ICD-10-CM | POA: Diagnosis not present

## 2023-01-15 DIAGNOSIS — M542 Cervicalgia: Secondary | ICD-10-CM | POA: Diagnosis not present

## 2023-01-15 DIAGNOSIS — M79602 Pain in left arm: Secondary | ICD-10-CM | POA: Diagnosis not present

## 2023-01-21 DIAGNOSIS — M5412 Radiculopathy, cervical region: Secondary | ICD-10-CM | POA: Diagnosis not present

## 2023-01-28 DIAGNOSIS — R531 Weakness: Secondary | ICD-10-CM | POA: Diagnosis not present

## 2023-01-28 DIAGNOSIS — M79602 Pain in left arm: Secondary | ICD-10-CM | POA: Diagnosis not present

## 2023-01-28 DIAGNOSIS — M542 Cervicalgia: Secondary | ICD-10-CM | POA: Diagnosis not present

## 2023-02-06 DIAGNOSIS — M79602 Pain in left arm: Secondary | ICD-10-CM | POA: Diagnosis not present

## 2023-02-06 DIAGNOSIS — R531 Weakness: Secondary | ICD-10-CM | POA: Diagnosis not present

## 2023-02-06 DIAGNOSIS — M5412 Radiculopathy, cervical region: Secondary | ICD-10-CM | POA: Diagnosis not present

## 2023-02-06 DIAGNOSIS — M542 Cervicalgia: Secondary | ICD-10-CM | POA: Diagnosis not present

## 2023-02-11 DIAGNOSIS — M542 Cervicalgia: Secondary | ICD-10-CM | POA: Diagnosis not present

## 2023-02-11 DIAGNOSIS — R531 Weakness: Secondary | ICD-10-CM | POA: Diagnosis not present

## 2023-02-11 DIAGNOSIS — M79602 Pain in left arm: Secondary | ICD-10-CM | POA: Diagnosis not present

## 2023-02-12 ENCOUNTER — Other Ambulatory Visit (HOSPITAL_COMMUNITY): Payer: Self-pay

## 2023-02-25 DIAGNOSIS — M79602 Pain in left arm: Secondary | ICD-10-CM | POA: Diagnosis not present

## 2023-02-25 DIAGNOSIS — M542 Cervicalgia: Secondary | ICD-10-CM | POA: Diagnosis not present

## 2023-02-25 DIAGNOSIS — R531 Weakness: Secondary | ICD-10-CM | POA: Diagnosis not present

## 2023-02-27 DIAGNOSIS — M542 Cervicalgia: Secondary | ICD-10-CM | POA: Diagnosis not present

## 2023-02-27 DIAGNOSIS — M79602 Pain in left arm: Secondary | ICD-10-CM | POA: Diagnosis not present

## 2023-02-27 DIAGNOSIS — R531 Weakness: Secondary | ICD-10-CM | POA: Diagnosis not present

## 2023-03-04 DIAGNOSIS — M542 Cervicalgia: Secondary | ICD-10-CM | POA: Diagnosis not present

## 2023-03-04 DIAGNOSIS — M79602 Pain in left arm: Secondary | ICD-10-CM | POA: Diagnosis not present

## 2023-03-04 DIAGNOSIS — R531 Weakness: Secondary | ICD-10-CM | POA: Diagnosis not present

## 2023-03-11 DIAGNOSIS — M542 Cervicalgia: Secondary | ICD-10-CM | POA: Diagnosis not present

## 2023-03-11 DIAGNOSIS — M79602 Pain in left arm: Secondary | ICD-10-CM | POA: Diagnosis not present

## 2023-03-11 DIAGNOSIS — R531 Weakness: Secondary | ICD-10-CM | POA: Diagnosis not present

## 2023-03-16 ENCOUNTER — Other Ambulatory Visit: Payer: Self-pay

## 2023-03-16 ENCOUNTER — Other Ambulatory Visit (HOSPITAL_COMMUNITY): Payer: Self-pay

## 2023-03-20 ENCOUNTER — Other Ambulatory Visit (HOSPITAL_COMMUNITY): Payer: Self-pay

## 2023-03-25 DIAGNOSIS — R531 Weakness: Secondary | ICD-10-CM | POA: Diagnosis not present

## 2023-03-25 DIAGNOSIS — M79602 Pain in left arm: Secondary | ICD-10-CM | POA: Diagnosis not present

## 2023-03-25 DIAGNOSIS — M542 Cervicalgia: Secondary | ICD-10-CM | POA: Diagnosis not present

## 2023-04-01 DIAGNOSIS — M5412 Radiculopathy, cervical region: Secondary | ICD-10-CM | POA: Diagnosis not present

## 2023-04-08 DIAGNOSIS — M542 Cervicalgia: Secondary | ICD-10-CM | POA: Diagnosis not present

## 2023-04-08 DIAGNOSIS — R531 Weakness: Secondary | ICD-10-CM | POA: Diagnosis not present

## 2023-04-08 DIAGNOSIS — M79602 Pain in left arm: Secondary | ICD-10-CM | POA: Diagnosis not present

## 2023-04-15 DIAGNOSIS — M79602 Pain in left arm: Secondary | ICD-10-CM | POA: Diagnosis not present

## 2023-04-15 DIAGNOSIS — R531 Weakness: Secondary | ICD-10-CM | POA: Diagnosis not present

## 2023-04-15 DIAGNOSIS — M542 Cervicalgia: Secondary | ICD-10-CM | POA: Diagnosis not present

## 2023-04-21 ENCOUNTER — Other Ambulatory Visit (HOSPITAL_COMMUNITY): Payer: Self-pay

## 2023-04-24 ENCOUNTER — Other Ambulatory Visit (HOSPITAL_BASED_OUTPATIENT_CLINIC_OR_DEPARTMENT_OTHER): Payer: Self-pay

## 2023-04-24 DIAGNOSIS — R1013 Epigastric pain: Secondary | ICD-10-CM | POA: Diagnosis not present

## 2023-04-24 DIAGNOSIS — R399 Unspecified symptoms and signs involving the genitourinary system: Secondary | ICD-10-CM | POA: Diagnosis not present

## 2023-04-24 MED ORDER — CEPHALEXIN 500 MG PO CAPS
500.0000 mg | ORAL_CAPSULE | Freq: Two times a day (BID) | ORAL | 0 refills | Status: DC
  Filled 2023-04-24: qty 10, 5d supply, fill #0

## 2023-04-27 ENCOUNTER — Other Ambulatory Visit (HOSPITAL_BASED_OUTPATIENT_CLINIC_OR_DEPARTMENT_OTHER): Payer: Self-pay

## 2023-04-27 DIAGNOSIS — R399 Unspecified symptoms and signs involving the genitourinary system: Secondary | ICD-10-CM | POA: Diagnosis not present

## 2023-04-27 DIAGNOSIS — R1013 Epigastric pain: Secondary | ICD-10-CM | POA: Diagnosis not present

## 2023-04-27 MED ORDER — OMEPRAZOLE 40 MG PO CPDR
40.0000 mg | DELAYED_RELEASE_CAPSULE | Freq: Every day | ORAL | 2 refills | Status: AC
  Filled 2023-04-27: qty 30, 30d supply, fill #0

## 2023-04-29 DIAGNOSIS — M79602 Pain in left arm: Secondary | ICD-10-CM | POA: Diagnosis not present

## 2023-04-29 DIAGNOSIS — R531 Weakness: Secondary | ICD-10-CM | POA: Diagnosis not present

## 2023-04-29 DIAGNOSIS — M542 Cervicalgia: Secondary | ICD-10-CM | POA: Diagnosis not present

## 2023-05-08 DIAGNOSIS — R531 Weakness: Secondary | ICD-10-CM | POA: Diagnosis not present

## 2023-05-08 DIAGNOSIS — M79602 Pain in left arm: Secondary | ICD-10-CM | POA: Diagnosis not present

## 2023-05-08 DIAGNOSIS — M542 Cervicalgia: Secondary | ICD-10-CM | POA: Diagnosis not present

## 2023-05-15 DIAGNOSIS — M79602 Pain in left arm: Secondary | ICD-10-CM | POA: Diagnosis not present

## 2023-05-15 DIAGNOSIS — R531 Weakness: Secondary | ICD-10-CM | POA: Diagnosis not present

## 2023-05-15 DIAGNOSIS — M542 Cervicalgia: Secondary | ICD-10-CM | POA: Diagnosis not present

## 2023-05-22 DIAGNOSIS — M542 Cervicalgia: Secondary | ICD-10-CM | POA: Diagnosis not present

## 2023-05-22 DIAGNOSIS — M79602 Pain in left arm: Secondary | ICD-10-CM | POA: Diagnosis not present

## 2023-05-22 DIAGNOSIS — R531 Weakness: Secondary | ICD-10-CM | POA: Diagnosis not present

## 2023-05-27 DIAGNOSIS — M79602 Pain in left arm: Secondary | ICD-10-CM | POA: Diagnosis not present

## 2023-05-27 DIAGNOSIS — R531 Weakness: Secondary | ICD-10-CM | POA: Diagnosis not present

## 2023-05-27 DIAGNOSIS — M542 Cervicalgia: Secondary | ICD-10-CM | POA: Diagnosis not present

## 2023-06-16 ENCOUNTER — Other Ambulatory Visit (HOSPITAL_COMMUNITY): Payer: Self-pay

## 2023-06-17 DIAGNOSIS — M79602 Pain in left arm: Secondary | ICD-10-CM | POA: Diagnosis not present

## 2023-06-17 DIAGNOSIS — M542 Cervicalgia: Secondary | ICD-10-CM | POA: Diagnosis not present

## 2023-06-17 DIAGNOSIS — R531 Weakness: Secondary | ICD-10-CM | POA: Diagnosis not present

## 2023-06-24 DIAGNOSIS — R531 Weakness: Secondary | ICD-10-CM | POA: Diagnosis not present

## 2023-06-24 DIAGNOSIS — M542 Cervicalgia: Secondary | ICD-10-CM | POA: Diagnosis not present

## 2023-06-24 DIAGNOSIS — M79602 Pain in left arm: Secondary | ICD-10-CM | POA: Diagnosis not present

## 2023-07-03 ENCOUNTER — Other Ambulatory Visit (HOSPITAL_BASED_OUTPATIENT_CLINIC_OR_DEPARTMENT_OTHER): Payer: Self-pay

## 2023-07-03 DIAGNOSIS — F902 Attention-deficit hyperactivity disorder, combined type: Secondary | ICD-10-CM | POA: Diagnosis not present

## 2023-07-03 DIAGNOSIS — F411 Generalized anxiety disorder: Secondary | ICD-10-CM | POA: Diagnosis not present

## 2023-07-03 DIAGNOSIS — F331 Major depressive disorder, recurrent, moderate: Secondary | ICD-10-CM | POA: Diagnosis not present

## 2023-07-03 MED ORDER — TRAZODONE HCL 50 MG PO TABS
25.0000 mg | ORAL_TABLET | Freq: Every day | ORAL | 0 refills | Status: AC
  Filled 2023-07-03: qty 90, 90d supply, fill #0

## 2023-07-03 MED ORDER — AMPHETAMINE-DEXTROAMPHETAMINE 20 MG PO TABS
20.0000 mg | ORAL_TABLET | Freq: Every morning | ORAL | 0 refills | Status: DC
  Filled 2023-07-03: qty 30, 30d supply, fill #0

## 2023-07-03 MED ORDER — AMPHETAMINE-DEXTROAMPHETAMINE 20 MG PO TABS: 20.0000 mg | ORAL_TABLET | Freq: Every morning | ORAL | 0 refills | Status: DC

## 2023-07-03 MED ORDER — VILAZODONE HCL 40 MG PO TABS
ORAL_TABLET | ORAL | 0 refills | Status: DC
  Filled 2023-07-03: qty 90, 90d supply, fill #0

## 2023-07-03 MED ORDER — AMPHETAMINE-DEXTROAMPHETAMINE 20 MG PO TABS
20.0000 mg | ORAL_TABLET | Freq: Every morning | ORAL | 0 refills | Status: AC
  Filled 2023-08-27: qty 30, 30d supply, fill #0

## 2023-07-10 DIAGNOSIS — M542 Cervicalgia: Secondary | ICD-10-CM | POA: Diagnosis not present

## 2023-07-10 DIAGNOSIS — R531 Weakness: Secondary | ICD-10-CM | POA: Diagnosis not present

## 2023-07-10 DIAGNOSIS — M79602 Pain in left arm: Secondary | ICD-10-CM | POA: Diagnosis not present

## 2023-08-03 DIAGNOSIS — S9031XA Contusion of right foot, initial encounter: Secondary | ICD-10-CM | POA: Diagnosis not present

## 2023-08-03 DIAGNOSIS — S99921A Unspecified injury of right foot, initial encounter: Secondary | ICD-10-CM | POA: Diagnosis not present

## 2023-08-21 DIAGNOSIS — F4312 Post-traumatic stress disorder, chronic: Secondary | ICD-10-CM | POA: Diagnosis not present

## 2023-08-21 DIAGNOSIS — F4323 Adjustment disorder with mixed anxiety and depressed mood: Secondary | ICD-10-CM | POA: Diagnosis not present

## 2023-08-24 ENCOUNTER — Ambulatory Visit (HOSPITAL_BASED_OUTPATIENT_CLINIC_OR_DEPARTMENT_OTHER): Payer: Commercial Managed Care - PPO

## 2023-08-24 ENCOUNTER — Other Ambulatory Visit (HOSPITAL_BASED_OUTPATIENT_CLINIC_OR_DEPARTMENT_OTHER): Payer: Self-pay

## 2023-08-24 ENCOUNTER — Ambulatory Visit (HOSPITAL_BASED_OUTPATIENT_CLINIC_OR_DEPARTMENT_OTHER): Payer: Commercial Managed Care - PPO | Admitting: Certified Nurse Midwife

## 2023-08-24 ENCOUNTER — Other Ambulatory Visit (HOSPITAL_COMMUNITY)
Admission: RE | Admit: 2023-08-24 | Discharge: 2023-08-24 | Disposition: A | Discharge status: Home / Self Care | Payer: Commercial Managed Care - PPO | Source: Ambulatory Visit | Attending: Certified Nurse Midwife | Admitting: Certified Nurse Midwife

## 2023-08-24 ENCOUNTER — Encounter (HOSPITAL_BASED_OUTPATIENT_CLINIC_OR_DEPARTMENT_OTHER): Payer: Self-pay

## 2023-08-24 ENCOUNTER — Other Ambulatory Visit (HOSPITAL_BASED_OUTPATIENT_CLINIC_OR_DEPARTMENT_OTHER): Payer: Self-pay | Admitting: Obstetrics & Gynecology

## 2023-08-24 ENCOUNTER — Ambulatory Visit (HOSPITAL_BASED_OUTPATIENT_CLINIC_OR_DEPARTMENT_OTHER): Admission: RE | Admit: 2023-08-24 | Payer: Commercial Managed Care - PPO | Source: Ambulatory Visit

## 2023-08-24 ENCOUNTER — Encounter (HOSPITAL_BASED_OUTPATIENT_CLINIC_OR_DEPARTMENT_OTHER): Payer: Self-pay | Admitting: Certified Nurse Midwife

## 2023-08-24 VITALS — BP 129/69 | HR 73 | Ht 65.0 in | Wt 133.4 lb

## 2023-08-24 DIAGNOSIS — Z124 Encounter for screening for malignant neoplasm of cervix: Secondary | ICD-10-CM | POA: Insufficient documentation

## 2023-08-24 DIAGNOSIS — N952 Postmenopausal atrophic vaginitis: Secondary | ICD-10-CM

## 2023-08-24 DIAGNOSIS — N950 Postmenopausal bleeding: Secondary | ICD-10-CM

## 2023-08-24 MED ORDER — ESTRADIOL 0.1 MG/GM VA CREA
1.0000 | TOPICAL_CREAM | Freq: Every day | VAGINAL | 12 refills | Status: AC
  Filled 2023-08-24: qty 42.5, 30d supply, fill #0
  Filled 2023-11-30 (×2): qty 42.5, 30d supply, fill #1

## 2023-08-24 MED ORDER — ESTRADIOL 0.1 MG/GM VA CREA: 1.0000 | TOPICAL_CREAM | Freq: Every day | VAGINAL | 12 refills | Status: DC

## 2023-08-24 NOTE — Progress Notes (Unsigned)
   Subjective:  Kelly [name] is a 63yo postmenopausal MWF [id number] here for problem gyn visit due to postmenopausal bleeding. Pt reports LMP was approximately 11 years ago. On Saturday (2 days ago),  she noticed old blood on her pantiliner. Pt had picture on her phone today showing brownish discharge. Pt states it smelled like "old blood". Last sexual intercourse was 6 days ago. She felt dry and it was uncomfortable. There was no bleeding after intercourse.   The following portions of the patient's history were reviewed and updated as appropriate: allergies, current medications, past family history, past medical history, past social history, past surgical history, and problem list.  Review of Systems Pertinent items are noted in HPI.    Objective:    BP 129/69 (BP Location: Left Arm, Patient Position: Sitting, Cuff Size: Normal)   Pulse 73   Ht 5\' 5"  (1.651 m)   Wt 133 lb 6.4 oz (60.5 kg)   LMP 12/29/2013   BMI 22.20 kg/m  General appearance: alert, cooperative, and appears stated age Abdomen: soft, non-tender; bowel sounds normal; no masses,  no organomegaly Pelvic: Cervix very anterior/high and difficult to visualize, appears nulliparous and stenotic.  cervix normal in appearance, external genitalia normal, no cervical motion tenderness, positive findings: vaginal mucosa atrophic, rectovaginal septum normal, and uterus normal size, shape, and consistency  Scant amount bright-red bleeding noted at cervical os with pap smear.    Pelvic US: CLINICAL DATA:  Postmenopausal bleeding   EXAM: TRANSVAGINAL ULTRASOUND OF PELVIS   TECHNIQUE: Transvaginal ultrasound examination of the pelvis was performed for imaging of the pelvis including uterus, ovaries, adnexal regions, and pelvic cul-de-sac.     FINDINGS: Uterus: 6.3 x 4.1 x 3.0cm.  Volume: 41.95ml   Endometrial thickness:  scant fluid present within endometrial cavity.  Endometrium 4.6mm   Right ovary:  1.5 x 1.5 x 0.6cm, atrophic.  Volume:   0.59ml   Left ovary:  not visualized.     Other findings:  No abnormal free fluid.  No adnexal masses.  No free fluid.    Assessment:    Vaginal Atrophy .   Postmenopausal bleeding  Plan:  Routine pap smear collected Pelvic US completed. Will plan follow-up Endometrial Biopsy with Dr. Hyacinth Meeker Cytotec intravaginal night before endometrial biopsy, repeat the morning of endometrial biopsy  Discussed vaginal atrophy. Pt agreeable to vaginal estrogen therapy twice a day for 2 weeks then twice weekly.  Letta Kocher

## 2023-08-25 ENCOUNTER — Encounter (HOSPITAL_BASED_OUTPATIENT_CLINIC_OR_DEPARTMENT_OTHER): Payer: Self-pay | Admitting: Certified Nurse Midwife

## 2023-08-25 NOTE — Progress Notes (Signed)
A user error has taken place:  Pt was scheduled for 2 appointments same day.

## 2023-08-26 ENCOUNTER — Encounter (HOSPITAL_BASED_OUTPATIENT_CLINIC_OR_DEPARTMENT_OTHER): Payer: Self-pay | Admitting: Certified Nurse Midwife

## 2023-08-26 LAB — CYTOLOGY - PAP
Chlamydia: NEGATIVE
Comment: NEGATIVE
Comment: NEGATIVE
Comment: NEGATIVE
Comment: NORMAL
Diagnosis: NEGATIVE
High risk HPV: NEGATIVE
Neisseria Gonorrhea: NEGATIVE
Trichomonas: NEGATIVE

## 2023-08-26 NOTE — Progress Notes (Signed)
This patient is scheduled for 09/22/23 for endometrial biopsy. Kelly [name]

## 2023-08-26 NOTE — Progress Notes (Signed)
Pt cancelled her endometrial biopsy and opted to have it collected at another location.  Letta Kocher

## 2023-08-26 NOTE — Progress Notes (Signed)
Patient cancelled her endometrial biopsy (stated she decided to have it done elsewhere).  Letta Kocher

## 2023-08-27 ENCOUNTER — Other Ambulatory Visit (HOSPITAL_COMMUNITY): Payer: Self-pay

## 2023-08-27 ENCOUNTER — Other Ambulatory Visit (HOSPITAL_BASED_OUTPATIENT_CLINIC_OR_DEPARTMENT_OTHER): Payer: Self-pay

## 2023-08-27 DIAGNOSIS — N950 Postmenopausal bleeding: Secondary | ICD-10-CM | POA: Diagnosis not present

## 2023-08-27 MED ORDER — MISOPROSTOL 200 MCG PO TABS
ORAL_TABLET | ORAL | 0 refills | Status: AC
  Filled 2023-08-27 (×2): qty 2, 2d supply, fill #0

## 2023-08-31 DIAGNOSIS — N950 Postmenopausal bleeding: Secondary | ICD-10-CM | POA: Diagnosis not present

## 2023-09-01 ENCOUNTER — Encounter (HOSPITAL_BASED_OUTPATIENT_CLINIC_OR_DEPARTMENT_OTHER): Payer: Self-pay | Admitting: Certified Nurse Midwife

## 2023-09-01 ENCOUNTER — Encounter (HOSPITAL_COMMUNITY): Payer: Self-pay | Admitting: Family Medicine

## 2023-09-01 ENCOUNTER — Other Ambulatory Visit (HOSPITAL_COMMUNITY): Payer: Self-pay | Admitting: Family Medicine

## 2023-09-01 DIAGNOSIS — R1084 Generalized abdominal pain: Secondary | ICD-10-CM

## 2023-09-02 ENCOUNTER — Ambulatory Visit (HOSPITAL_COMMUNITY)
Admission: RE | Admit: 2023-09-02 | Discharge: 2023-09-02 | Disposition: A | Discharge status: Home / Self Care | Payer: Commercial Managed Care - PPO | Source: Ambulatory Visit | Attending: Family Medicine | Admitting: Family Medicine

## 2023-09-02 DIAGNOSIS — R1084 Generalized abdominal pain: Secondary | ICD-10-CM | POA: Insufficient documentation

## 2023-09-02 MED ORDER — IOHEXOL 350 MG/ML SOLN
75.0000 mL | Freq: Once | INTRAVENOUS | Status: AC | PRN
  Administered 2023-09-02: 75 mL via INTRAVENOUS

## 2023-09-07 DIAGNOSIS — R531 Weakness: Secondary | ICD-10-CM | POA: Diagnosis not present

## 2023-09-07 DIAGNOSIS — M542 Cervicalgia: Secondary | ICD-10-CM | POA: Diagnosis not present

## 2023-09-07 DIAGNOSIS — M79602 Pain in left arm: Secondary | ICD-10-CM | POA: Diagnosis not present

## 2023-09-09 ENCOUNTER — Other Ambulatory Visit (HOSPITAL_COMMUNITY): Payer: Self-pay

## 2023-09-10 ENCOUNTER — Other Ambulatory Visit (HOSPITAL_COMMUNITY): Payer: Self-pay

## 2023-09-16 ENCOUNTER — Other Ambulatory Visit (HOSPITAL_COMMUNITY): Payer: Self-pay

## 2023-09-16 DIAGNOSIS — G4700 Insomnia, unspecified: Secondary | ICD-10-CM | POA: Diagnosis not present

## 2023-09-16 DIAGNOSIS — Z853 Personal history of malignant neoplasm of breast: Secondary | ICD-10-CM | POA: Diagnosis not present

## 2023-09-16 DIAGNOSIS — Z0000 Encounter for general adult medical examination without abnormal findings: Secondary | ICD-10-CM | POA: Diagnosis not present

## 2023-09-16 DIAGNOSIS — R7309 Other abnormal glucose: Secondary | ICD-10-CM | POA: Diagnosis not present

## 2023-09-16 MED ORDER — TRAZODONE HCL 50 MG PO TABS
50.0000 mg | ORAL_TABLET | Freq: Every day | ORAL | 1 refills | Status: AC | PRN
  Filled 2023-09-16: qty 90, 90d supply, fill #0
  Filled 2024-08-31: qty 90, 90d supply, fill #1

## 2023-09-18 ENCOUNTER — Other Ambulatory Visit (HOSPITAL_COMMUNITY): Payer: Self-pay

## 2023-09-18 DIAGNOSIS — F902 Attention-deficit hyperactivity disorder, combined type: Secondary | ICD-10-CM | POA: Diagnosis not present

## 2023-09-18 DIAGNOSIS — F331 Major depressive disorder, recurrent, moderate: Secondary | ICD-10-CM | POA: Diagnosis not present

## 2023-09-18 DIAGNOSIS — F411 Generalized anxiety disorder: Secondary | ICD-10-CM | POA: Diagnosis not present

## 2023-09-18 MED ORDER — TRAZODONE HCL 50 MG PO TABS
25.0000 mg | ORAL_TABLET | Freq: Every day | ORAL | 0 refills | Status: AC
Start: 2023-09-18 — End: ?
  Filled 2024-06-10: qty 90, 90d supply, fill #0

## 2023-09-18 MED ORDER — AMPHETAMINE-DEXTROAMPHETAMINE 20 MG PO TABS
20.0000 mg | ORAL_TABLET | Freq: Every morning | ORAL | 0 refills | Status: AC
  Filled 2023-10-16: qty 30, 30d supply, fill #0

## 2023-09-18 MED ORDER — AMPHETAMINE-DEXTROAMPHETAMINE 20 MG PO TABS: 20.0000 mg | ORAL_TABLET | Freq: Every morning | ORAL | 0 refills | Status: AC

## 2023-09-18 MED ORDER — HYDROXYZINE HCL 25 MG PO TABS
25.0000 mg | ORAL_TABLET | Freq: Three times a day (TID) | ORAL | 0 refills | Status: AC | PRN
Start: 2023-09-18 — End: ?
  Filled 2024-04-27: qty 270, 90d supply, fill #0

## 2023-09-18 MED ORDER — AMPHETAMINE-DEXTROAMPHETAMINE 20 MG PO TABS
ORAL_TABLET | ORAL | 0 refills | Status: AC
Start: 2023-09-18 — End: ?
  Filled 2023-11-30: qty 30, 30d supply, fill #0

## 2023-09-18 MED ORDER — HYDROXYZINE HCL 25 MG PO TABS
ORAL_TABLET | ORAL | 0 refills | Status: AC
  Filled 2023-09-18: qty 90, 90d supply, fill #0

## 2023-09-18 MED ORDER — VILAZODONE HCL 40 MG PO TABS
ORAL_TABLET | ORAL | 0 refills | Status: DC
Start: 2023-09-18 — End: 2023-12-14
  Filled 2023-09-18: qty 90, 90d supply, fill #0

## 2023-09-19 ENCOUNTER — Other Ambulatory Visit (HOSPITAL_COMMUNITY): Payer: Self-pay

## 2023-09-22 ENCOUNTER — Other Ambulatory Visit (HOSPITAL_BASED_OUTPATIENT_CLINIC_OR_DEPARTMENT_OTHER): Payer: Commercial Managed Care - PPO | Admitting: Obstetrics & Gynecology

## 2023-09-22 DIAGNOSIS — M79602 Pain in left arm: Secondary | ICD-10-CM | POA: Diagnosis not present

## 2023-09-22 DIAGNOSIS — R531 Weakness: Secondary | ICD-10-CM | POA: Diagnosis not present

## 2023-09-22 DIAGNOSIS — M542 Cervicalgia: Secondary | ICD-10-CM | POA: Diagnosis not present

## 2023-10-06 DIAGNOSIS — M542 Cervicalgia: Secondary | ICD-10-CM | POA: Diagnosis not present

## 2023-10-06 DIAGNOSIS — R531 Weakness: Secondary | ICD-10-CM | POA: Diagnosis not present

## 2023-10-06 DIAGNOSIS — M79602 Pain in left arm: Secondary | ICD-10-CM | POA: Diagnosis not present

## 2023-10-07 ENCOUNTER — Other Ambulatory Visit (HOSPITAL_COMMUNITY): Payer: Self-pay

## 2023-10-16 ENCOUNTER — Other Ambulatory Visit (HOSPITAL_COMMUNITY): Payer: Self-pay

## 2023-10-19 DIAGNOSIS — M542 Cervicalgia: Secondary | ICD-10-CM | POA: Diagnosis not present

## 2023-10-19 DIAGNOSIS — M79602 Pain in left arm: Secondary | ICD-10-CM | POA: Diagnosis not present

## 2023-10-19 DIAGNOSIS — R531 Weakness: Secondary | ICD-10-CM | POA: Diagnosis not present

## 2023-10-31 ENCOUNTER — Other Ambulatory Visit (HOSPITAL_COMMUNITY): Payer: Self-pay

## 2023-11-18 DIAGNOSIS — R531 Weakness: Secondary | ICD-10-CM | POA: Diagnosis not present

## 2023-11-18 DIAGNOSIS — M542 Cervicalgia: Secondary | ICD-10-CM | POA: Diagnosis not present

## 2023-11-18 DIAGNOSIS — M79602 Pain in left arm: Secondary | ICD-10-CM | POA: Diagnosis not present

## 2023-11-25 DIAGNOSIS — M79602 Pain in left arm: Secondary | ICD-10-CM | POA: Diagnosis not present

## 2023-11-25 DIAGNOSIS — M542 Cervicalgia: Secondary | ICD-10-CM | POA: Diagnosis not present

## 2023-11-25 DIAGNOSIS — R531 Weakness: Secondary | ICD-10-CM | POA: Diagnosis not present

## 2023-11-30 ENCOUNTER — Other Ambulatory Visit (HOSPITAL_COMMUNITY): Payer: Self-pay

## 2023-11-30 ENCOUNTER — Other Ambulatory Visit: Payer: Self-pay

## 2023-11-30 DIAGNOSIS — M542 Cervicalgia: Secondary | ICD-10-CM | POA: Diagnosis not present

## 2023-11-30 DIAGNOSIS — R531 Weakness: Secondary | ICD-10-CM | POA: Diagnosis not present

## 2023-11-30 DIAGNOSIS — M79602 Pain in left arm: Secondary | ICD-10-CM | POA: Diagnosis not present

## 2023-12-09 DIAGNOSIS — M542 Cervicalgia: Secondary | ICD-10-CM | POA: Diagnosis not present

## 2023-12-09 DIAGNOSIS — M79602 Pain in left arm: Secondary | ICD-10-CM | POA: Diagnosis not present

## 2023-12-09 DIAGNOSIS — F4312 Post-traumatic stress disorder, chronic: Secondary | ICD-10-CM | POA: Diagnosis not present

## 2023-12-09 DIAGNOSIS — F4323 Adjustment disorder with mixed anxiety and depressed mood: Secondary | ICD-10-CM | POA: Diagnosis not present

## 2023-12-09 DIAGNOSIS — R531 Weakness: Secondary | ICD-10-CM | POA: Diagnosis not present

## 2023-12-14 ENCOUNTER — Other Ambulatory Visit (HOSPITAL_COMMUNITY): Payer: Self-pay

## 2023-12-14 DIAGNOSIS — F4312 Post-traumatic stress disorder, chronic: Secondary | ICD-10-CM | POA: Diagnosis not present

## 2023-12-14 DIAGNOSIS — F331 Major depressive disorder, recurrent, moderate: Secondary | ICD-10-CM | POA: Diagnosis not present

## 2023-12-14 DIAGNOSIS — F902 Attention-deficit hyperactivity disorder, combined type: Secondary | ICD-10-CM | POA: Diagnosis not present

## 2023-12-14 DIAGNOSIS — F4323 Adjustment disorder with mixed anxiety and depressed mood: Secondary | ICD-10-CM | POA: Diagnosis not present

## 2023-12-14 DIAGNOSIS — F411 Generalized anxiety disorder: Secondary | ICD-10-CM | POA: Diagnosis not present

## 2023-12-14 DIAGNOSIS — M5412 Radiculopathy, cervical region: Secondary | ICD-10-CM | POA: Diagnosis not present

## 2023-12-14 MED ORDER — AMPHETAMINE-DEXTROAMPHETAMINE 20 MG PO TABS
20.0000 mg | ORAL_TABLET | Freq: Every morning | ORAL | 0 refills | Status: AC
  Filled 2023-12-23 – 2024-02-02 (×2): qty 30, 30d supply, fill #0

## 2023-12-14 MED ORDER — AMPHETAMINE-DEXTROAMPHETAMINE 20 MG PO TABS
20.0000 mg | ORAL_TABLET | Freq: Every morning | ORAL | 0 refills | Status: AC
  Filled 2024-06-10: qty 30, 30d supply, fill #0

## 2023-12-14 MED ORDER — AMPHETAMINE-DEXTROAMPHETAMINE 20 MG PO TABS
20.0000 mg | ORAL_TABLET | Freq: Every morning | ORAL | 0 refills | Status: AC
  Filled 2024-04-27: qty 30, 30d supply, fill #0

## 2023-12-14 MED ORDER — VILAZODONE HCL 40 MG PO TABS
40.0000 mg | ORAL_TABLET | Freq: Every day | ORAL | 0 refills | Status: DC
  Filled 2023-12-14 – 2023-12-23 (×2): qty 90, 90d supply, fill #0

## 2023-12-14 MED ORDER — TRAZODONE HCL 50 MG PO TABS
25.0000 mg | ORAL_TABLET | Freq: Every day | ORAL | 0 refills | Status: AC
Start: 2023-12-14 — End: ?
  Filled 2023-12-14 – 2023-12-23 (×2): qty 90, 90d supply, fill #0

## 2023-12-15 ENCOUNTER — Other Ambulatory Visit (HOSPITAL_COMMUNITY): Payer: Self-pay

## 2023-12-16 DIAGNOSIS — M79602 Pain in left arm: Secondary | ICD-10-CM | POA: Diagnosis not present

## 2023-12-16 DIAGNOSIS — M542 Cervicalgia: Secondary | ICD-10-CM | POA: Diagnosis not present

## 2023-12-16 DIAGNOSIS — R531 Weakness: Secondary | ICD-10-CM | POA: Diagnosis not present

## 2023-12-22 DIAGNOSIS — M5412 Radiculopathy, cervical region: Secondary | ICD-10-CM | POA: Diagnosis not present

## 2023-12-23 ENCOUNTER — Other Ambulatory Visit (HOSPITAL_COMMUNITY): Payer: Self-pay

## 2023-12-23 DIAGNOSIS — F4323 Adjustment disorder with mixed anxiety and depressed mood: Secondary | ICD-10-CM | POA: Diagnosis not present

## 2023-12-23 DIAGNOSIS — F4312 Post-traumatic stress disorder, chronic: Secondary | ICD-10-CM | POA: Diagnosis not present

## 2023-12-28 DIAGNOSIS — M542 Cervicalgia: Secondary | ICD-10-CM | POA: Diagnosis not present

## 2023-12-28 DIAGNOSIS — R531 Weakness: Secondary | ICD-10-CM | POA: Diagnosis not present

## 2023-12-28 DIAGNOSIS — M79602 Pain in left arm: Secondary | ICD-10-CM | POA: Diagnosis not present

## 2023-12-30 DIAGNOSIS — F4312 Post-traumatic stress disorder, chronic: Secondary | ICD-10-CM | POA: Diagnosis not present

## 2023-12-30 DIAGNOSIS — F4323 Adjustment disorder with mixed anxiety and depressed mood: Secondary | ICD-10-CM | POA: Diagnosis not present

## 2024-01-04 DIAGNOSIS — M542 Cervicalgia: Secondary | ICD-10-CM | POA: Diagnosis not present

## 2024-01-04 DIAGNOSIS — R531 Weakness: Secondary | ICD-10-CM | POA: Diagnosis not present

## 2024-01-04 DIAGNOSIS — M79602 Pain in left arm: Secondary | ICD-10-CM | POA: Diagnosis not present

## 2024-01-11 DIAGNOSIS — M542 Cervicalgia: Secondary | ICD-10-CM | POA: Diagnosis not present

## 2024-01-11 DIAGNOSIS — R531 Weakness: Secondary | ICD-10-CM | POA: Diagnosis not present

## 2024-01-11 DIAGNOSIS — M79602 Pain in left arm: Secondary | ICD-10-CM | POA: Diagnosis not present

## 2024-01-13 DIAGNOSIS — F4323 Adjustment disorder with mixed anxiety and depressed mood: Secondary | ICD-10-CM | POA: Diagnosis not present

## 2024-01-13 DIAGNOSIS — F4312 Post-traumatic stress disorder, chronic: Secondary | ICD-10-CM | POA: Diagnosis not present

## 2024-01-18 DIAGNOSIS — M542 Cervicalgia: Secondary | ICD-10-CM | POA: Diagnosis not present

## 2024-01-18 DIAGNOSIS — R531 Weakness: Secondary | ICD-10-CM | POA: Diagnosis not present

## 2024-01-18 DIAGNOSIS — M79602 Pain in left arm: Secondary | ICD-10-CM | POA: Diagnosis not present

## 2024-01-20 DIAGNOSIS — F4323 Adjustment disorder with mixed anxiety and depressed mood: Secondary | ICD-10-CM | POA: Diagnosis not present

## 2024-01-20 DIAGNOSIS — F4312 Post-traumatic stress disorder, chronic: Secondary | ICD-10-CM | POA: Diagnosis not present

## 2024-01-25 DIAGNOSIS — M79602 Pain in left arm: Secondary | ICD-10-CM | POA: Diagnosis not present

## 2024-01-25 DIAGNOSIS — M542 Cervicalgia: Secondary | ICD-10-CM | POA: Diagnosis not present

## 2024-01-25 DIAGNOSIS — R531 Weakness: Secondary | ICD-10-CM | POA: Diagnosis not present

## 2024-01-27 DIAGNOSIS — M542 Cervicalgia: Secondary | ICD-10-CM | POA: Diagnosis not present

## 2024-01-27 DIAGNOSIS — M79602 Pain in left arm: Secondary | ICD-10-CM | POA: Diagnosis not present

## 2024-01-27 DIAGNOSIS — R531 Weakness: Secondary | ICD-10-CM | POA: Diagnosis not present

## 2024-01-27 DIAGNOSIS — F4312 Post-traumatic stress disorder, chronic: Secondary | ICD-10-CM | POA: Diagnosis not present

## 2024-01-27 DIAGNOSIS — F4323 Adjustment disorder with mixed anxiety and depressed mood: Secondary | ICD-10-CM | POA: Diagnosis not present

## 2024-02-02 ENCOUNTER — Other Ambulatory Visit (HOSPITAL_COMMUNITY): Payer: Self-pay

## 2024-02-03 DIAGNOSIS — F4323 Adjustment disorder with mixed anxiety and depressed mood: Secondary | ICD-10-CM | POA: Diagnosis not present

## 2024-02-03 DIAGNOSIS — F4312 Post-traumatic stress disorder, chronic: Secondary | ICD-10-CM | POA: Diagnosis not present

## 2024-02-09 ENCOUNTER — Encounter: Payer: Self-pay | Admitting: Sports Medicine

## 2024-02-09 ENCOUNTER — Ambulatory Visit: Admitting: Sports Medicine

## 2024-02-09 VITALS — BP 108/80 | Ht 65.0 in | Wt 133.0 lb

## 2024-02-09 DIAGNOSIS — M79605 Pain in left leg: Secondary | ICD-10-CM

## 2024-02-09 NOTE — Progress Notes (Signed)
   Subjective:    Patient ID: Kelly [name], female    DOB: [birth year], 63 y.o.   MRN: [id number]  HPI chief complaint: Left leg pain  Kelly [name] is a pleasant 63 year old female that presents today with sudden onset left leg pain that began after a 12-hour shift late last week.  She denies any trauma.  She describes a stabbing type of pain that began initially around the posterior and lateral left knee.  Pain then would radiate proximally and distally.  She was extremely uncomfortable.  She took some pain medication as well as ibuprofen.  She has also purchased a compression sleeve for the leg.  Her symptoms are improving.  She does endorse swelling in the leg.  No knee pain.  Past medical history reviewed Medications reviewed Allergies reviewed  Review of Systems As above    Objective:   Physical Exam  Well-developed, well-nourished.  No acute distress  Left leg: There is some ecchymosis as well as some mild soft tissue swelling in the anterior mid thigh.  She is tender to palpation in this area.  Examination of the left knee shows full painless range of motion.  No tenderness to palpation.  Knee is stable to ligamentous exam.  There is some mild swelling distally in the calf.  Neurological exam shows good strength and brisk reflexes throughout the lower left leg.  MSK ultrasound of the left leg was performed.  I do not see any fluid collection in the quadriceps area.  MSK of the knee shows no knee effusion and no obvious Baker's cyst.     Assessment & Plan:   Left leg pain and swelling  Etiology is not straightforward.  Fortunately, she is improving with compression.  It is possible that she had a Baker's cyst that has since ruptured but that would not explain swelling and ecchymosis proximally in her thigh.  I recommend a watchful waiting approach with instructions to return to the office if symptoms begin to worsen.  She will continue with her compression.  She understands the need  to limit her ibuprofen to only as needed since she has a history of mild renal insufficiency.  Follow-up as needed.  This note was dictated using Dragon naturally speaking software and may contain errors in syntax, spelling, or content which have not been identified prior to signing this note.

## 2024-02-10 ENCOUNTER — Ambulatory Visit: Admitting: Sports Medicine

## 2024-02-10 DIAGNOSIS — F4323 Adjustment disorder with mixed anxiety and depressed mood: Secondary | ICD-10-CM | POA: Diagnosis not present

## 2024-02-10 DIAGNOSIS — F4312 Post-traumatic stress disorder, chronic: Secondary | ICD-10-CM | POA: Diagnosis not present

## 2024-02-10 DIAGNOSIS — M7741 Metatarsalgia, right foot: Secondary | ICD-10-CM | POA: Diagnosis not present

## 2024-02-10 DIAGNOSIS — M25562 Pain in left knee: Secondary | ICD-10-CM | POA: Diagnosis not present

## 2024-02-16 DIAGNOSIS — Z0100 Encounter for examination of eyes and vision without abnormal findings: Secondary | ICD-10-CM | POA: Diagnosis not present

## 2024-02-16 DIAGNOSIS — F4312 Post-traumatic stress disorder, chronic: Secondary | ICD-10-CM | POA: Diagnosis not present

## 2024-02-16 DIAGNOSIS — H524 Presbyopia: Secondary | ICD-10-CM | POA: Diagnosis not present

## 2024-02-16 DIAGNOSIS — F4323 Adjustment disorder with mixed anxiety and depressed mood: Secondary | ICD-10-CM | POA: Diagnosis not present

## 2024-02-24 DIAGNOSIS — F4323 Adjustment disorder with mixed anxiety and depressed mood: Secondary | ICD-10-CM | POA: Diagnosis not present

## 2024-02-24 DIAGNOSIS — F4312 Post-traumatic stress disorder, chronic: Secondary | ICD-10-CM | POA: Diagnosis not present

## 2024-03-02 DIAGNOSIS — F4312 Post-traumatic stress disorder, chronic: Secondary | ICD-10-CM | POA: Diagnosis not present

## 2024-03-02 DIAGNOSIS — F4323 Adjustment disorder with mixed anxiety and depressed mood: Secondary | ICD-10-CM | POA: Diagnosis not present

## 2024-03-08 ENCOUNTER — Other Ambulatory Visit: Payer: Self-pay

## 2024-03-08 ENCOUNTER — Other Ambulatory Visit (HOSPITAL_COMMUNITY): Payer: Self-pay

## 2024-03-08 DIAGNOSIS — F411 Generalized anxiety disorder: Secondary | ICD-10-CM | POA: Diagnosis not present

## 2024-03-08 DIAGNOSIS — F902 Attention-deficit hyperactivity disorder, combined type: Secondary | ICD-10-CM | POA: Diagnosis not present

## 2024-03-08 DIAGNOSIS — F331 Major depressive disorder, recurrent, moderate: Secondary | ICD-10-CM | POA: Diagnosis not present

## 2024-03-08 MED ORDER — AMPHETAMINE-DEXTROAMPHET ER 20 MG PO CP24
20.0000 mg | ORAL_CAPSULE | Freq: Every morning | ORAL | 0 refills | Status: AC
  Filled 2024-03-08: qty 30, 30d supply, fill #0

## 2024-03-08 MED ORDER — AMPHETAMINE-DEXTROAMPHET ER 20 MG PO CP24
20.0000 mg | ORAL_CAPSULE | Freq: Every morning | ORAL | 0 refills | Status: AC
  Filled 2024-08-31: qty 20, 20d supply, fill #0

## 2024-03-08 MED ORDER — VILAZODONE HCL 40 MG PO TABS
40.0000 mg | ORAL_TABLET | Freq: Every day | ORAL | 0 refills | Status: AC
  Filled 2024-03-08 – 2024-04-27 (×2): qty 90, 90d supply, fill #0

## 2024-03-08 MED ORDER — TRAZODONE HCL 50 MG PO TABS
25.0000 mg | ORAL_TABLET | Freq: Every day | ORAL | 0 refills | Status: AC
  Filled 2024-03-08 – 2024-03-10 (×3): qty 90, 90d supply, fill #0

## 2024-03-08 MED ORDER — AMPHETAMINE-DEXTROAMPHET ER 20 MG PO CP24: 20.0000 mg | ORAL_CAPSULE | Freq: Every morning | ORAL | 0 refills | Status: AC

## 2024-03-09 DIAGNOSIS — F4323 Adjustment disorder with mixed anxiety and depressed mood: Secondary | ICD-10-CM | POA: Diagnosis not present

## 2024-03-09 DIAGNOSIS — F4312 Post-traumatic stress disorder, chronic: Secondary | ICD-10-CM | POA: Diagnosis not present

## 2024-03-10 ENCOUNTER — Other Ambulatory Visit (HOSPITAL_COMMUNITY): Payer: Self-pay

## 2024-03-10 ENCOUNTER — Other Ambulatory Visit: Payer: Self-pay

## 2024-03-16 DIAGNOSIS — F4312 Post-traumatic stress disorder, chronic: Secondary | ICD-10-CM | POA: Diagnosis not present

## 2024-03-16 DIAGNOSIS — F4323 Adjustment disorder with mixed anxiety and depressed mood: Secondary | ICD-10-CM | POA: Diagnosis not present

## 2024-03-23 DIAGNOSIS — F4312 Post-traumatic stress disorder, chronic: Secondary | ICD-10-CM | POA: Diagnosis not present

## 2024-03-23 DIAGNOSIS — F4323 Adjustment disorder with mixed anxiety and depressed mood: Secondary | ICD-10-CM | POA: Diagnosis not present

## 2024-03-30 DIAGNOSIS — F4323 Adjustment disorder with mixed anxiety and depressed mood: Secondary | ICD-10-CM | POA: Diagnosis not present

## 2024-03-30 DIAGNOSIS — F4312 Post-traumatic stress disorder, chronic: Secondary | ICD-10-CM | POA: Diagnosis not present

## 2024-04-04 DIAGNOSIS — M542 Cervicalgia: Secondary | ICD-10-CM | POA: Diagnosis not present

## 2024-04-04 DIAGNOSIS — M6281 Muscle weakness (generalized): Secondary | ICD-10-CM | POA: Diagnosis not present

## 2024-04-06 DIAGNOSIS — F4312 Post-traumatic stress disorder, chronic: Secondary | ICD-10-CM | POA: Diagnosis not present

## 2024-04-06 DIAGNOSIS — F4323 Adjustment disorder with mixed anxiety and depressed mood: Secondary | ICD-10-CM | POA: Diagnosis not present

## 2024-04-13 DIAGNOSIS — F4323 Adjustment disorder with mixed anxiety and depressed mood: Secondary | ICD-10-CM | POA: Diagnosis not present

## 2024-04-13 DIAGNOSIS — M542 Cervicalgia: Secondary | ICD-10-CM | POA: Diagnosis not present

## 2024-04-13 DIAGNOSIS — M6281 Muscle weakness (generalized): Secondary | ICD-10-CM | POA: Diagnosis not present

## 2024-04-13 DIAGNOSIS — F4312 Post-traumatic stress disorder, chronic: Secondary | ICD-10-CM | POA: Diagnosis not present

## 2024-04-27 ENCOUNTER — Other Ambulatory Visit (HOSPITAL_COMMUNITY): Payer: Self-pay

## 2024-04-27 DIAGNOSIS — M6281 Muscle weakness (generalized): Secondary | ICD-10-CM | POA: Diagnosis not present

## 2024-04-27 DIAGNOSIS — F4323 Adjustment disorder with mixed anxiety and depressed mood: Secondary | ICD-10-CM | POA: Diagnosis not present

## 2024-04-27 DIAGNOSIS — F4312 Post-traumatic stress disorder, chronic: Secondary | ICD-10-CM | POA: Diagnosis not present

## 2024-04-27 DIAGNOSIS — M542 Cervicalgia: Secondary | ICD-10-CM | POA: Diagnosis not present

## 2024-04-27 MED ORDER — HYDROXYZINE HCL 25 MG PO TABS
25.0000 mg | ORAL_TABLET | Freq: Three times a day (TID) | ORAL | 0 refills | Status: AC | PRN
  Filled 2024-04-27: qty 270, 270d supply, fill #0

## 2024-05-25 DIAGNOSIS — F4312 Post-traumatic stress disorder, chronic: Secondary | ICD-10-CM | POA: Diagnosis not present

## 2024-05-25 DIAGNOSIS — F4323 Adjustment disorder with mixed anxiety and depressed mood: Secondary | ICD-10-CM | POA: Diagnosis not present

## 2024-06-01 DIAGNOSIS — M542 Cervicalgia: Secondary | ICD-10-CM | POA: Diagnosis not present

## 2024-06-01 DIAGNOSIS — F4312 Post-traumatic stress disorder, chronic: Secondary | ICD-10-CM | POA: Diagnosis not present

## 2024-06-01 DIAGNOSIS — F4323 Adjustment disorder with mixed anxiety and depressed mood: Secondary | ICD-10-CM | POA: Diagnosis not present

## 2024-06-01 DIAGNOSIS — M6281 Muscle weakness (generalized): Secondary | ICD-10-CM | POA: Diagnosis not present

## 2024-06-02 DIAGNOSIS — R10A1 Flank pain, right side: Secondary | ICD-10-CM | POA: Diagnosis not present

## 2024-06-02 DIAGNOSIS — Z853 Personal history of malignant neoplasm of breast: Secondary | ICD-10-CM | POA: Diagnosis not present

## 2024-06-02 DIAGNOSIS — M5489 Other dorsalgia: Secondary | ICD-10-CM | POA: Diagnosis not present

## 2024-06-02 DIAGNOSIS — R1030 Lower abdominal pain, unspecified: Secondary | ICD-10-CM | POA: Diagnosis not present

## 2024-06-03 ENCOUNTER — Ambulatory Visit
Admission: RE | Admit: 2024-06-03 | Discharge: 2024-06-03 | Disposition: A | Discharge status: Home / Self Care | Source: Ambulatory Visit | Attending: Family Medicine | Admitting: Family Medicine

## 2024-06-03 ENCOUNTER — Other Ambulatory Visit: Payer: Self-pay | Admitting: Family Medicine

## 2024-06-03 DIAGNOSIS — R1030 Lower abdominal pain, unspecified: Secondary | ICD-10-CM

## 2024-06-03 DIAGNOSIS — R1084 Generalized abdominal pain: Secondary | ICD-10-CM | POA: Diagnosis not present

## 2024-06-03 DIAGNOSIS — M5489 Other dorsalgia: Secondary | ICD-10-CM

## 2024-06-03 DIAGNOSIS — R10A1 Flank pain, right side: Secondary | ICD-10-CM

## 2024-06-03 MED ORDER — IOPAMIDOL (ISOVUE-370) INJECTION 76%
80.0000 mL | Freq: Once | INTRAVENOUS | Status: AC | PRN
  Administered 2024-06-03: 80 mL via INTRAVENOUS

## 2024-06-08 DIAGNOSIS — M542 Cervicalgia: Secondary | ICD-10-CM | POA: Diagnosis not present

## 2024-06-08 DIAGNOSIS — M6281 Muscle weakness (generalized): Secondary | ICD-10-CM | POA: Diagnosis not present

## 2024-06-08 DIAGNOSIS — R1909 Other intra-abdominal and pelvic swelling, mass and lump: Secondary | ICD-10-CM | POA: Diagnosis not present

## 2024-06-10 ENCOUNTER — Other Ambulatory Visit (HOSPITAL_COMMUNITY): Payer: Self-pay

## 2024-06-11 ENCOUNTER — Other Ambulatory Visit (HOSPITAL_COMMUNITY): Payer: Self-pay

## 2024-06-11 DIAGNOSIS — F4323 Adjustment disorder with mixed anxiety and depressed mood: Secondary | ICD-10-CM | POA: Diagnosis not present

## 2024-06-13 ENCOUNTER — Other Ambulatory Visit: Payer: Self-pay | Admitting: Family Medicine

## 2024-06-13 DIAGNOSIS — R1909 Other intra-abdominal and pelvic swelling, mass and lump: Secondary | ICD-10-CM

## 2024-06-15 ENCOUNTER — Encounter: Payer: Self-pay | Admitting: Family Medicine

## 2024-06-15 DIAGNOSIS — F4323 Adjustment disorder with mixed anxiety and depressed mood: Secondary | ICD-10-CM | POA: Diagnosis not present

## 2024-06-15 DIAGNOSIS — M6281 Muscle weakness (generalized): Secondary | ICD-10-CM | POA: Diagnosis not present

## 2024-06-15 DIAGNOSIS — M542 Cervicalgia: Secondary | ICD-10-CM | POA: Diagnosis not present

## 2024-06-22 DIAGNOSIS — M542 Cervicalgia: Secondary | ICD-10-CM | POA: Diagnosis not present

## 2024-06-22 DIAGNOSIS — M6281 Muscle weakness (generalized): Secondary | ICD-10-CM | POA: Diagnosis not present

## 2024-06-22 DIAGNOSIS — F4323 Adjustment disorder with mixed anxiety and depressed mood: Secondary | ICD-10-CM | POA: Diagnosis not present

## 2024-06-29 DIAGNOSIS — M6281 Muscle weakness (generalized): Secondary | ICD-10-CM | POA: Diagnosis not present

## 2024-06-29 DIAGNOSIS — F4323 Adjustment disorder with mixed anxiety and depressed mood: Secondary | ICD-10-CM | POA: Diagnosis not present

## 2024-06-29 DIAGNOSIS — M542 Cervicalgia: Secondary | ICD-10-CM | POA: Diagnosis not present

## 2024-06-30 ENCOUNTER — Ambulatory Visit
Admission: RE | Admit: 2024-06-30 | Discharge: 2024-06-30 | Disposition: A | Source: Ambulatory Visit | Attending: Family Medicine

## 2024-06-30 DIAGNOSIS — M545 Low back pain, unspecified: Secondary | ICD-10-CM | POA: Diagnosis not present

## 2024-06-30 DIAGNOSIS — R1909 Other intra-abdominal and pelvic swelling, mass and lump: Secondary | ICD-10-CM

## 2024-06-30 MED ORDER — GADOPICLENOL 0.5 MMOL/ML IV SOLN
6.0000 mL | Freq: Once | INTRAVENOUS | Status: AC | PRN
  Administered 2024-06-30: 6 mL via INTRAVENOUS

## 2024-07-06 DIAGNOSIS — M6281 Muscle weakness (generalized): Secondary | ICD-10-CM | POA: Diagnosis not present

## 2024-07-06 DIAGNOSIS — F4323 Adjustment disorder with mixed anxiety and depressed mood: Secondary | ICD-10-CM | POA: Diagnosis not present

## 2024-07-06 DIAGNOSIS — M542 Cervicalgia: Secondary | ICD-10-CM | POA: Diagnosis not present

## 2024-07-07 DIAGNOSIS — F4323 Adjustment disorder with mixed anxiety and depressed mood: Secondary | ICD-10-CM | POA: Diagnosis not present

## 2024-07-09 DIAGNOSIS — F4323 Adjustment disorder with mixed anxiety and depressed mood: Secondary | ICD-10-CM | POA: Diagnosis not present

## 2024-07-13 DIAGNOSIS — M542 Cervicalgia: Secondary | ICD-10-CM | POA: Diagnosis not present

## 2024-07-13 DIAGNOSIS — F4323 Adjustment disorder with mixed anxiety and depressed mood: Secondary | ICD-10-CM | POA: Diagnosis not present

## 2024-07-13 DIAGNOSIS — M6281 Muscle weakness (generalized): Secondary | ICD-10-CM | POA: Diagnosis not present

## 2024-07-19 DIAGNOSIS — F4323 Adjustment disorder with mixed anxiety and depressed mood: Secondary | ICD-10-CM | POA: Diagnosis not present

## 2024-08-12 ENCOUNTER — Other Ambulatory Visit (HOSPITAL_COMMUNITY): Payer: Self-pay

## 2024-08-12 MED ORDER — BENZONATATE 200 MG PO CAPS
200.0000 mg | ORAL_CAPSULE | Freq: Three times a day (TID) | ORAL | 0 refills | Status: AC | PRN
  Filled 2024-08-12: qty 45, 15d supply, fill #0

## 2024-08-12 MED ORDER — IPRATROPIUM BROMIDE 0.06 % NA SOLN
2.0000 | Freq: Four times a day (QID) | NASAL | 0 refills | Status: AC
  Filled 2024-08-12: qty 15, 19d supply, fill #0

## 2024-08-31 ENCOUNTER — Other Ambulatory Visit: Payer: Self-pay

## 2024-08-31 ENCOUNTER — Other Ambulatory Visit (HOSPITAL_COMMUNITY): Payer: Self-pay

## 2024-09-02 ENCOUNTER — Other Ambulatory Visit (HOSPITAL_COMMUNITY): Payer: Self-pay

## 2024-09-02 ENCOUNTER — Other Ambulatory Visit: Payer: Self-pay

## 2024-09-02 MED ORDER — AMPHETAMINE-DEXTROAMPHET ER 20 MG PO CP24: 20.0000 mg | ORAL_CAPSULE | Freq: Every morning | ORAL | 0 refills | Status: AC

## 2024-09-02 MED ORDER — HYDROXYZINE HCL 25 MG PO TABS
25.0000 mg | ORAL_TABLET | Freq: Three times a day (TID) | ORAL | 0 refills | Status: AC
  Filled 2024-09-02: qty 270, 90d supply, fill #0

## 2024-09-02 MED ORDER — VILAZODONE HCL 40 MG PO TABS
40.0000 mg | ORAL_TABLET | Freq: Every day | ORAL | 0 refills | Status: AC
  Filled 2024-09-02: qty 90, 90d supply, fill #0

## 2024-09-02 MED ORDER — TRAZODONE HCL 50 MG PO TABS
25.0000 mg | ORAL_TABLET | Freq: Every day | ORAL | 0 refills | Status: AC
  Filled 2024-09-02: qty 90, 90d supply, fill #0
